# Patient Record
Sex: Male | Born: 1945 | Race: White | Hispanic: No | Marital: Married | Smoking: Former smoker
Health system: Southern US, Community
[De-identification: ages and names within clinical notes are randomized; demographics above are authoritative.]

## PROBLEM LIST (undated history)

## (undated) DIAGNOSIS — M109 Gout, unspecified: Secondary | ICD-10-CM

## (undated) DIAGNOSIS — E785 Hyperlipidemia, unspecified: Secondary | ICD-10-CM

## (undated) DIAGNOSIS — I509 Heart failure, unspecified: Secondary | ICD-10-CM

## (undated) DIAGNOSIS — C801 Malignant (primary) neoplasm, unspecified: Secondary | ICD-10-CM

## (undated) DIAGNOSIS — M1990 Unspecified osteoarthritis, unspecified site: Secondary | ICD-10-CM

## (undated) DIAGNOSIS — I639 Cerebral infarction, unspecified: Secondary | ICD-10-CM

## (undated) DIAGNOSIS — E119 Type 2 diabetes mellitus without complications: Secondary | ICD-10-CM

## (undated) DIAGNOSIS — I10 Essential (primary) hypertension: Secondary | ICD-10-CM

## (undated) DIAGNOSIS — J45909 Unspecified asthma, uncomplicated: Secondary | ICD-10-CM

## (undated) HISTORY — PX: CHOLECYSTECTOMY: SHX55

---

## 2010-04-06 ENCOUNTER — Ambulatory Visit: Payer: Self-pay | Admitting: Family Medicine

## 2010-07-10 ENCOUNTER — Inpatient Hospital Stay: Payer: Self-pay | Admitting: Internal Medicine

## 2010-08-07 ENCOUNTER — Ambulatory Visit: Payer: Self-pay

## 2010-08-25 ENCOUNTER — Emergency Department: Payer: Self-pay | Admitting: Emergency Medicine

## 2011-03-15 ENCOUNTER — Emergency Department: Payer: Self-pay | Admitting: *Deleted

## 2011-05-07 ENCOUNTER — Emergency Department: Payer: Self-pay | Admitting: Emergency Medicine

## 2011-05-11 ENCOUNTER — Emergency Department: Payer: Self-pay | Admitting: Emergency Medicine

## 2011-06-02 ENCOUNTER — Emergency Department: Payer: Self-pay | Admitting: *Deleted

## 2011-08-29 ENCOUNTER — Emergency Department: Payer: Self-pay | Admitting: Emergency Medicine

## 2011-08-29 LAB — CBC
HGB: 11.5 g/dL — ABNORMAL LOW (ref 13.0–18.0)
MCH: 30 pg (ref 26.0–34.0)
MCV: 89 fL (ref 80–100)
Platelet: 206 10*3/uL (ref 150–440)
RBC: 3.84 10*6/uL — ABNORMAL LOW (ref 4.40–5.90)

## 2011-08-29 LAB — BASIC METABOLIC PANEL
Anion Gap: 10 (ref 7–16)
BUN: 19 mg/dL — ABNORMAL HIGH (ref 7–18)
Chloride: 102 mmol/L (ref 98–107)
Co2: 30 mmol/L (ref 21–32)
Creatinine: 1.01 mg/dL (ref 0.60–1.30)
EGFR (African American): >60
Osmolality: 287 (ref 275–301)

## 2011-10-05 ENCOUNTER — Emergency Department: Payer: Self-pay | Admitting: Emergency Medicine

## 2011-10-05 LAB — BASIC METABOLIC PANEL
Anion Gap: 13 (ref 7–16)
Calcium, Total: 9.4 mg/dL (ref 8.5–10.1)
Co2: 30 mmol/L (ref 21–32)
Creatinine: 1.03 mg/dL (ref 0.60–1.30)
EGFR (African American): >60

## 2011-10-05 LAB — CBC
HCT: 37 % — ABNORMAL LOW (ref 40.0–52.0)
HGB: 12.2 g/dL — ABNORMAL LOW (ref 13.0–18.0)
MCH: 28.5 pg (ref 26.0–34.0)
MCHC: 32.9 g/dL (ref 32.0–36.0)

## 2011-10-05 LAB — URINALYSIS, COMPLETE
Ketone: NEGATIVE
Ph: 7 (ref 4.5–8.0)
Protein: NEGATIVE
RBC,UR: 1 /HPF (ref 0–5)
Specific Gravity: 1.011 (ref 1.003–1.030)
Squamous Epithelial: NONE SEEN

## 2011-10-07 LAB — URINE CULTURE

## 2012-09-21 IMAGING — CR DG LUMBAR SPINE 2-3V
1 series · 3 of 3 positions shown · non-contrast
Comparison: none

REASON FOR EXAM: back pain
COMMENTS:

PROCEDURE:     DXR - DXR LUMBAR SPINE AP AND LATERAL  - March 16, 2011  [DATE]
RESULT:     Comparison: None

[Series 1: view not recorded · 0.17mm/px · 3 of 3 slices shown]
[im 1/3]
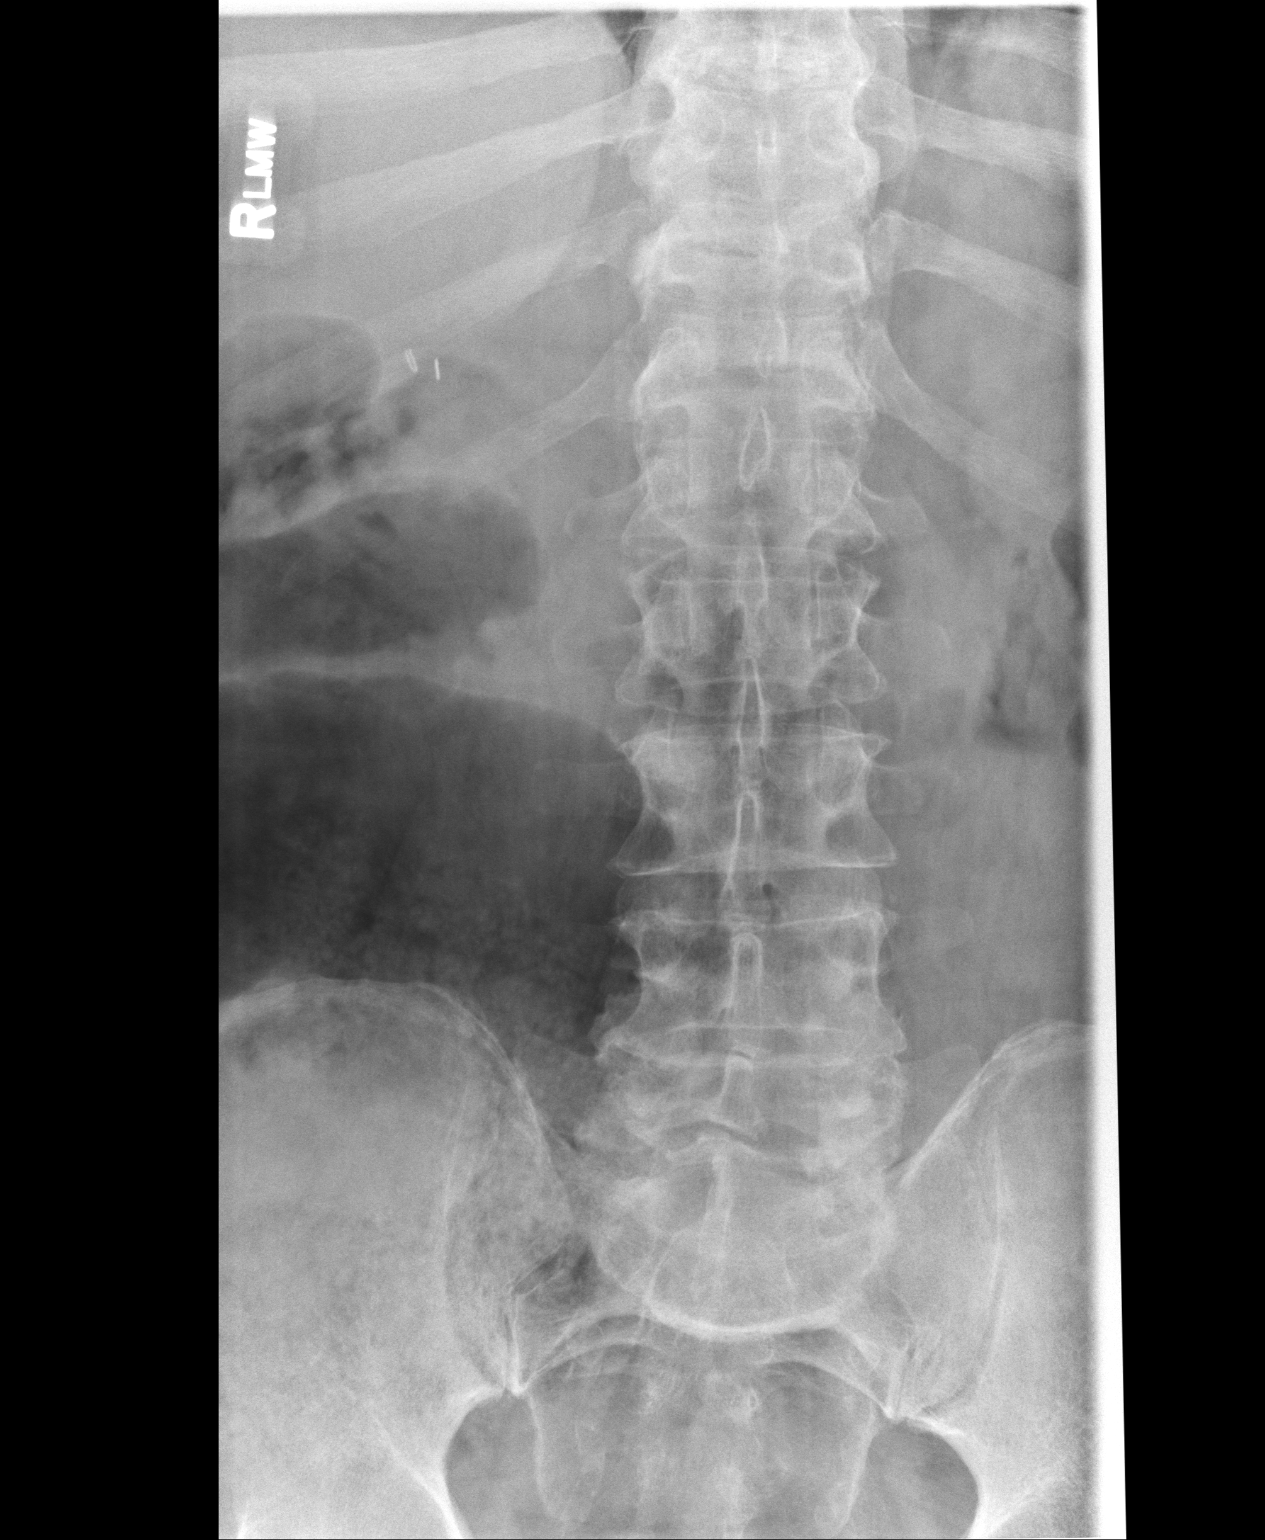
[im 2/3]
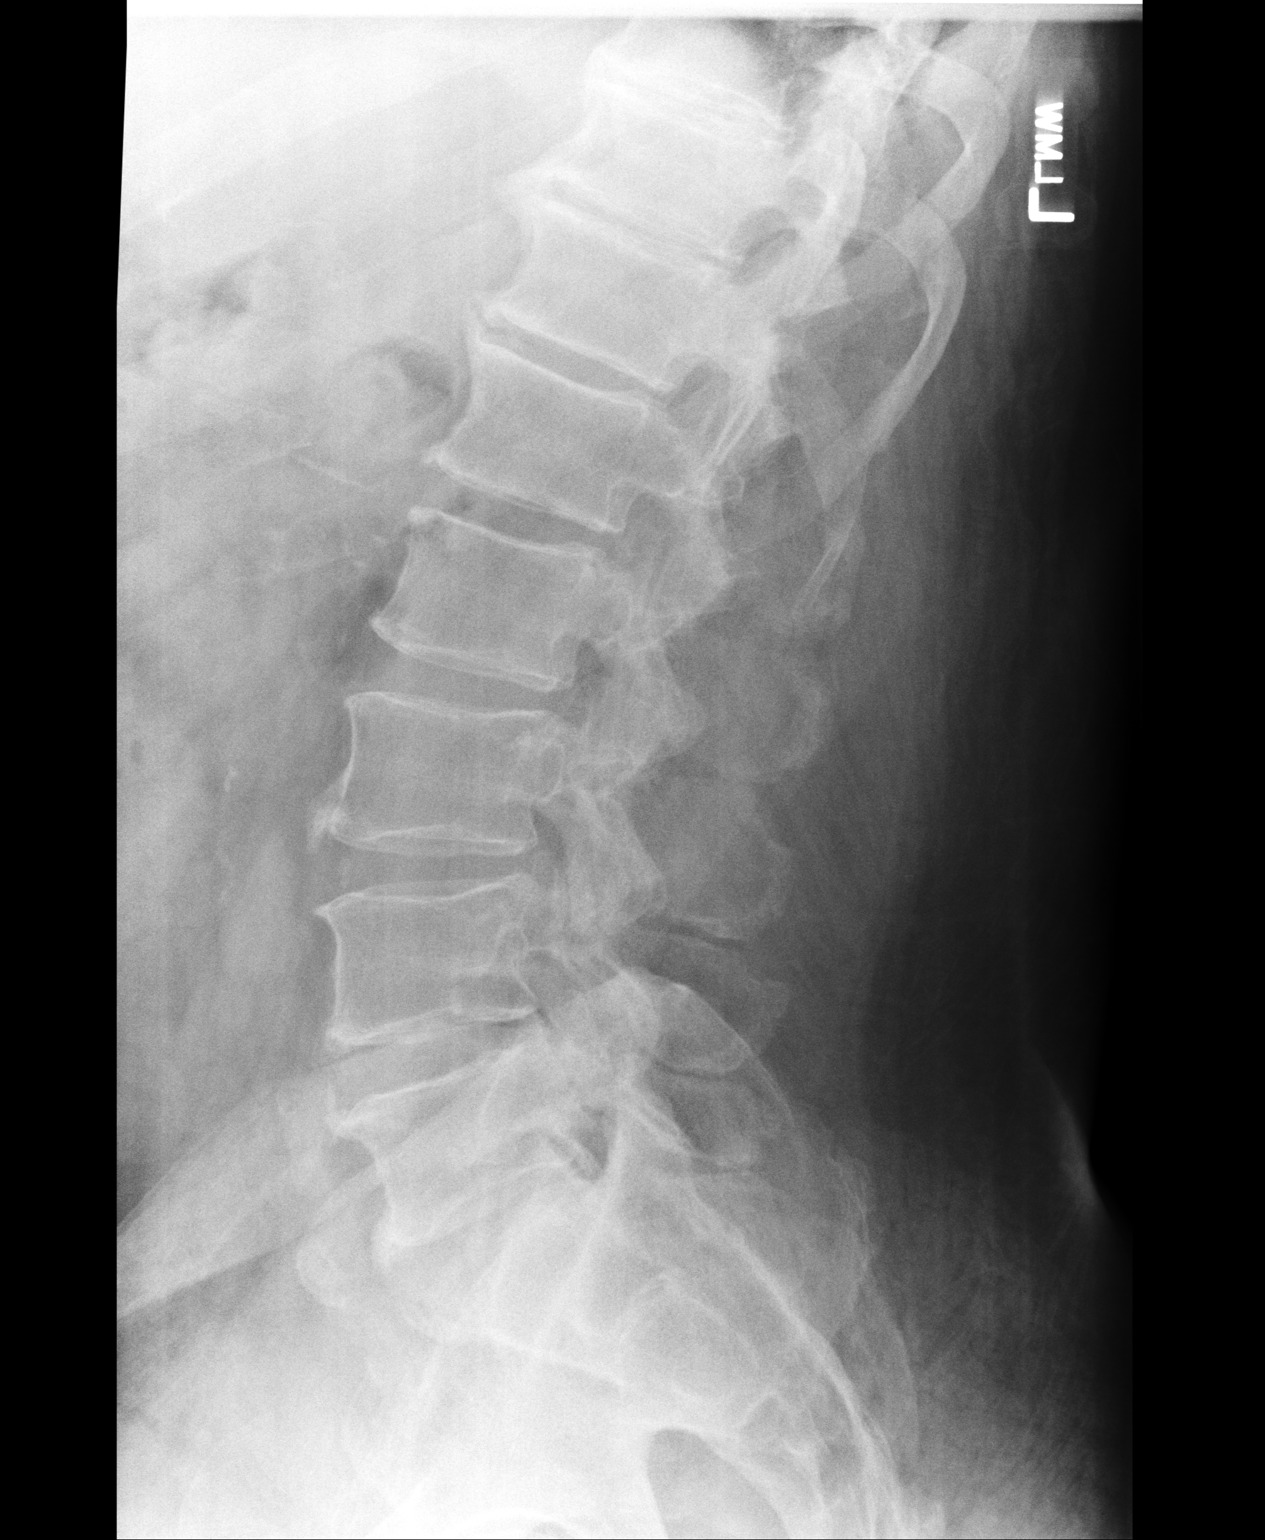
[im 3/3]
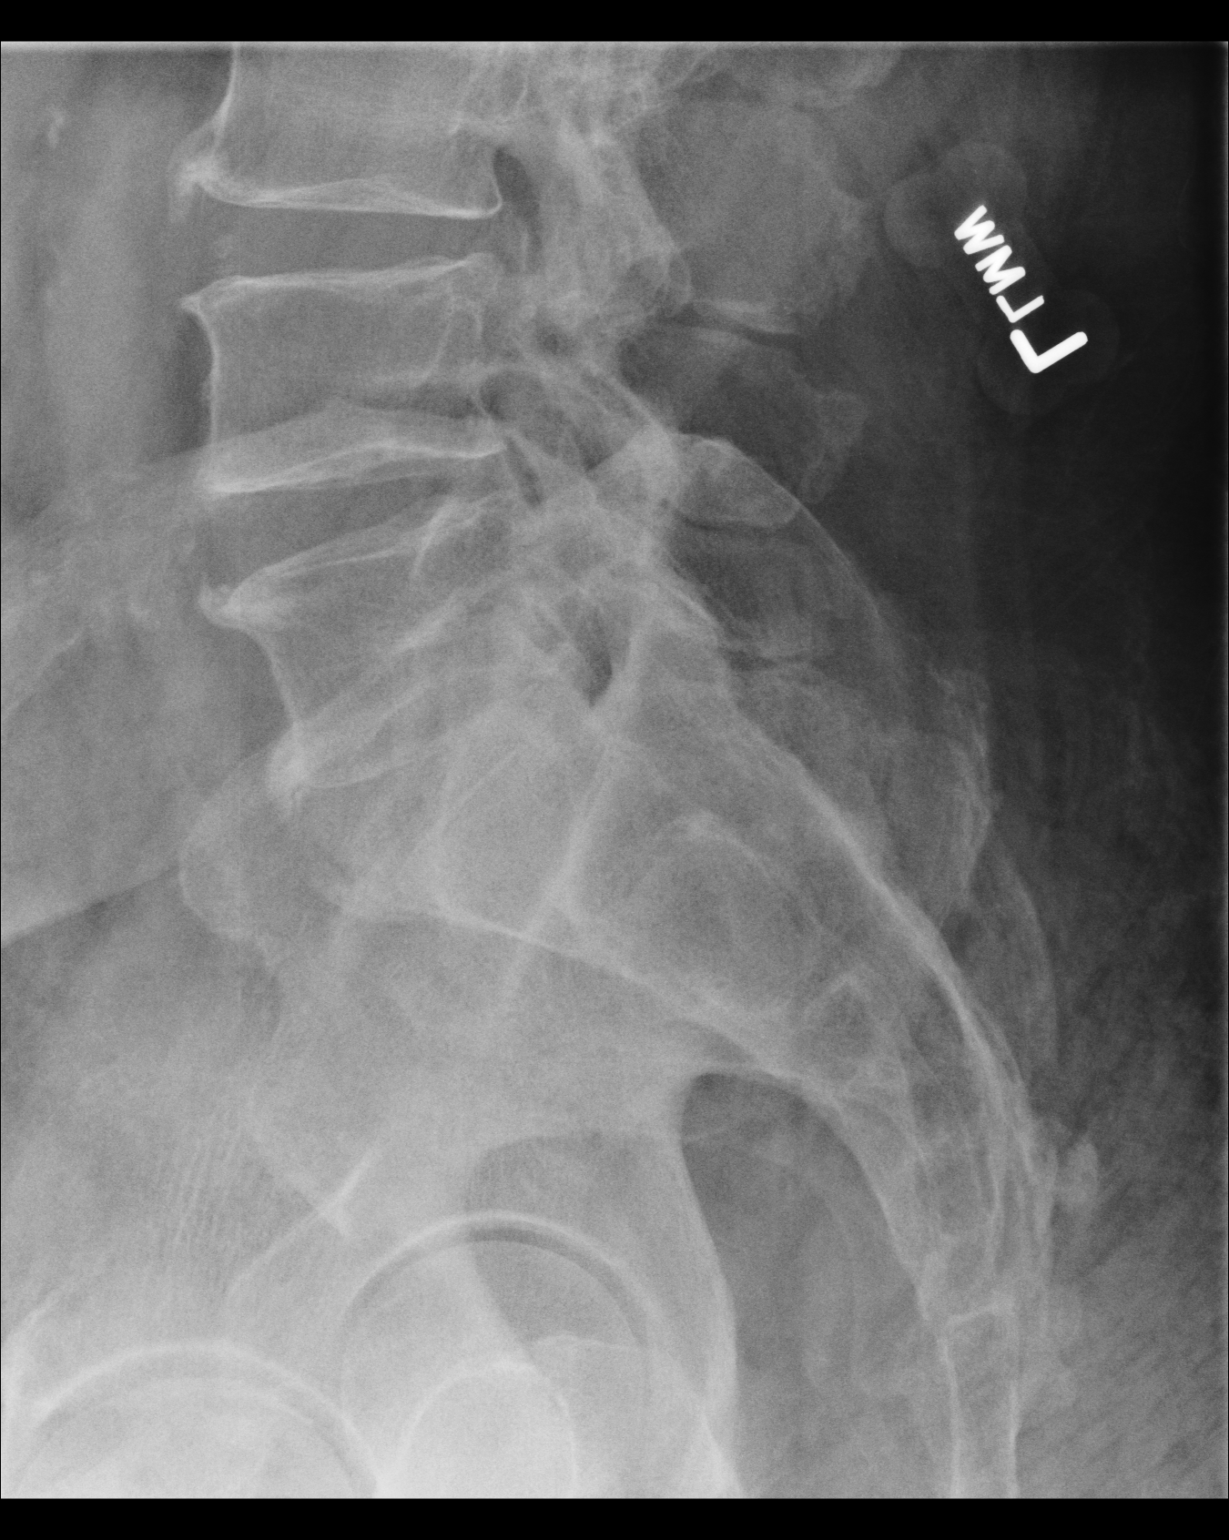

[3 of 3 positions shown; findings below may reference images not displayed]

FINDINGS: AP and lateral views of the lumbar spine and a coned down view of the
lumbosacral junction are provided.

There are 5 nonrib bearing lumbar-type vertebral bodies. The vertebral body
heights are maintained. The alignment is anatomic. There is no
spondylolysis. There is no acute fracture or static listhesis. The disc
spaces are maintained. There is lower lumbar spine facet arthropathy.

The SI joints are unremarkable.
IMPRESSION: 1. No acute osseous abnormality of the lumbar spine.

## 2013-01-19 LAB — COMPREHENSIVE METABOLIC PANEL
Albumin: 2.8 g/dL — ABNORMAL LOW (ref 3.4–5.0)
Alkaline Phosphatase: 78 U/L (ref 50–136)
BUN: 10 mg/dL (ref 7–18)
Bilirubin,Total: 0.4 mg/dL (ref 0.2–1.0)
Chloride: 104 mmol/L (ref 98–107)
Creatinine: 0.99 mg/dL (ref 0.60–1.30)
EGFR (African American): >60
EGFR (Non-African Amer.): >60
Glucose: 128 mg/dL — ABNORMAL HIGH (ref 65–99)
Osmolality: 278 (ref 275–301)
Potassium: 3.6 mmol/L (ref 3.5–5.1)
SGOT(AST): 33 U/L (ref 15–37)
Sodium: 139 mmol/L (ref 136–145)

## 2013-01-19 LAB — URINALYSIS, COMPLETE
Bilirubin,UR: NEGATIVE
Blood: NEGATIVE
Glucose,UR: NEGATIVE mg/dL (ref 0–75)
Protein: NEGATIVE
Specific Gravity: 1.023 (ref 1.003–1.030)

## 2013-01-19 LAB — CBC
HGB: 12.7 g/dL — ABNORMAL LOW (ref 13.0–18.0)
MCHC: 33.6 g/dL (ref 32.0–36.0)
Platelet: 174 10*3/uL (ref 150–440)
RBC: 4.28 10*6/uL — ABNORMAL LOW (ref 4.40–5.90)
RDW: 16.1 % — ABNORMAL HIGH (ref 11.5–14.5)
WBC: 9.9 10*3/uL (ref 3.8–10.6)

## 2013-01-19 LAB — CK TOTAL AND CKMB (NOT AT ARMC): CK-MB: 2 ng/mL (ref 0.5–3.6)

## 2013-01-20 ENCOUNTER — Inpatient Hospital Stay: Payer: Self-pay | Admitting: Internal Medicine

## 2013-01-21 LAB — CBC WITH DIFFERENTIAL/PLATELET
Basophil #: 0 10*3/uL (ref 0.0–0.1)
Basophil %: 0.4 %
Eosinophil #: 0.4 10*3/uL (ref 0.0–0.7)
HCT: 38.1 % — ABNORMAL LOW (ref 40.0–52.0)
Lymphocyte %: 47.4 %
MCV: 89 fL (ref 80–100)
Monocyte #: 1 x10 3/mm (ref 0.2–1.0)
Monocyte %: 16 %
Neutrophil #: 1.9 10*3/uL (ref 1.4–6.5)
Platelet: 166 10*3/uL (ref 150–440)

## 2013-01-21 LAB — BASIC METABOLIC PANEL
Anion Gap: 6 — ABNORMAL LOW (ref 7–16)
BUN: 12 mg/dL (ref 7–18)
Co2: 32 mmol/L (ref 21–32)
Creatinine: 1.08 mg/dL (ref 0.60–1.30)
EGFR (African American): >60
EGFR (Non-African Amer.): >60
Glucose: 138 mg/dL — ABNORMAL HIGH (ref 65–99)
Osmolality: 276 (ref 275–301)
Potassium: 3.4 mmol/L — ABNORMAL LOW (ref 3.5–5.1)
Sodium: 137 mmol/L (ref 136–145)

## 2013-01-21 LAB — URINE CULTURE

## 2013-01-22 LAB — BASIC METABOLIC PANEL
BUN: 14 mg/dL (ref 7–18)
Calcium, Total: 9.3 mg/dL (ref 8.5–10.1)
Chloride: 102 mmol/L (ref 98–107)
Co2: 32 mmol/L (ref 21–32)
Creatinine: 0.83 mg/dL (ref 0.60–1.30)
EGFR (African American): >60
EGFR (Non-African Amer.): >60
Potassium: 3.8 mmol/L (ref 3.5–5.1)

## 2013-01-24 LAB — BASIC METABOLIC PANEL
Anion Gap: 7 (ref 7–16)
BUN: 22 mg/dL — ABNORMAL HIGH (ref 7–18)
Calcium, Total: 9 mg/dL (ref 8.5–10.1)
Chloride: 99 mmol/L (ref 98–107)
EGFR (Non-African Amer.): >60
Glucose: 115 mg/dL — ABNORMAL HIGH (ref 65–99)
Potassium: 3.9 mmol/L (ref 3.5–5.1)
Sodium: 138 mmol/L (ref 136–145)

## 2013-01-25 LAB — CULTURE, BLOOD (SINGLE)

## 2013-03-05 ENCOUNTER — Observation Stay: Payer: Self-pay | Admitting: Internal Medicine

## 2013-03-05 LAB — COMPREHENSIVE METABOLIC PANEL
Anion Gap: 3 — ABNORMAL LOW (ref 7–16)
Bilirubin,Total: 0.5 mg/dL (ref 0.2–1.0)
Calcium, Total: 8.4 mg/dL — ABNORMAL LOW (ref 8.5–10.1)
Chloride: 102 mmol/L (ref 98–107)
Co2: 31 mmol/L (ref 21–32)
Creatinine: 0.99 mg/dL (ref 0.60–1.30)
EGFR (African American): >60
EGFR (Non-African Amer.): >60
Glucose: 102 mg/dL — ABNORMAL HIGH (ref 65–99)
Osmolality: 272 (ref 275–301)
Potassium: 4.7 mmol/L (ref 3.5–5.1)
SGOT(AST): 43 U/L — ABNORMAL HIGH (ref 15–37)
Sodium: 136 mmol/L (ref 136–145)
Total Protein: 6.9 g/dL (ref 6.4–8.2)

## 2013-03-05 LAB — CBC
HCT: 38.2 % — ABNORMAL LOW (ref 40.0–52.0)
MCHC: 34.1 g/dL (ref 32.0–36.0)
Platelet: 162 10*3/uL (ref 150–440)

## 2013-03-05 LAB — PRO B NATRIURETIC PEPTIDE: B-Type Natriuretic Peptide: 32 pg/mL (ref 0–125)

## 2013-03-05 LAB — HEMOGLOBIN A1C: Hemoglobin A1C: 7.8 % — ABNORMAL HIGH (ref 4.2–6.3)

## 2013-03-06 LAB — BASIC METABOLIC PANEL
Anion Gap: 2 — ABNORMAL LOW (ref 7–16)
BUN: 12 mg/dL (ref 7–18)
Calcium, Total: 8.6 mg/dL (ref 8.5–10.1)
Chloride: 103 mmol/L (ref 98–107)
EGFR (Non-African Amer.): >60
Glucose: 123 mg/dL — ABNORMAL HIGH (ref 65–99)
Sodium: 138 mmol/L (ref 136–145)

## 2013-03-06 LAB — URINALYSIS, COMPLETE
Glucose,UR: NEGATIVE mg/dL (ref 0–75)
Ketone: NEGATIVE
RBC,UR: NONE SEEN /HPF (ref 0–5)
Specific Gravity: 1.015 (ref 1.003–1.030)
Squamous Epithelial: <1

## 2013-03-06 LAB — CBC WITH DIFFERENTIAL/PLATELET
HGB: 12.6 g/dL — ABNORMAL LOW (ref 13.0–18.0)
Lymphocyte #: 2.8 10*3/uL (ref 1.0–3.6)
Lymphocyte %: 40.5 %
MCHC: 33.6 g/dL (ref 32.0–36.0)
Monocyte #: 0.9 x10 3/mm (ref 0.2–1.0)
Neutrophil #: 2.8 10*3/uL (ref 1.4–6.5)
Neutrophil %: 40.1 %
Platelet: 157 10*3/uL (ref 150–440)
WBC: 6.9 10*3/uL (ref 3.8–10.6)

## 2013-03-19 ENCOUNTER — Emergency Department: Payer: Self-pay | Admitting: Emergency Medicine

## 2013-03-19 LAB — COMPREHENSIVE METABOLIC PANEL
BUN: 15 mg/dL (ref 7–18)
EGFR (African American): >60
EGFR (Non-African Amer.): >60
Osmolality: 282 (ref 275–301)
Potassium: 4.6 mmol/L (ref 3.5–5.1)
SGOT(AST): 46 U/L — ABNORMAL HIGH (ref 15–37)
SGPT (ALT): 52 U/L (ref 12–78)
Sodium: 138 mmol/L (ref 136–145)
Total Protein: 7.2 g/dL (ref 6.4–8.2)

## 2013-03-19 LAB — CBC
HCT: 40.8 % (ref 40.0–52.0)
HGB: 14.3 g/dL (ref 13.0–18.0)
MCH: 30.8 pg (ref 26.0–34.0)
Platelet: 181 10*3/uL (ref 150–440)
RBC: 4.64 10*6/uL (ref 4.40–5.90)
RDW: 15.7 % — ABNORMAL HIGH (ref 11.5–14.5)

## 2013-03-19 LAB — TROPONIN I: Troponin-I: <0.02 ng/mL

## 2013-09-09 ENCOUNTER — Emergency Department: Payer: Self-pay | Admitting: Emergency Medicine

## 2013-09-09 LAB — COMPREHENSIVE METABOLIC PANEL
ALT: 36 U/L (ref 12–78)
Albumin: 3 g/dL — ABNORMAL LOW (ref 3.4–5.0)
Alkaline Phosphatase: 84 U/L
Anion Gap: 3 — ABNORMAL LOW (ref 7–16)
BUN: 13 mg/dL (ref 7–18)
Bilirubin,Total: 0.7 mg/dL (ref 0.2–1.0)
CALCIUM: 8.5 mg/dL (ref 8.5–10.1)
CREATININE: 0.95 mg/dL (ref 0.60–1.30)
Chloride: 101 mmol/L (ref 98–107)
Co2: 32 mmol/L (ref 21–32)
Glucose: 115 mg/dL — ABNORMAL HIGH (ref 65–99)
Osmolality: 273 (ref 275–301)
POTASSIUM: 4.8 mmol/L (ref 3.5–5.1)
SGOT(AST): 44 U/L — ABNORMAL HIGH (ref 15–37)
SODIUM: 136 mmol/L (ref 136–145)
TOTAL PROTEIN: 7.1 g/dL (ref 6.4–8.2)

## 2013-09-09 LAB — CBC WITH DIFFERENTIAL/PLATELET
BASOS ABS: 0.1 10*3/uL (ref 0.0–0.1)
Basophil %: 0.4 %
EOS ABS: 0.4 10*3/uL (ref 0.0–0.7)
Eosinophil %: 3.6 %
HCT: 38.8 % — AB (ref 40.0–52.0)
HGB: 12.4 g/dL — ABNORMAL LOW (ref 13.0–18.0)
LYMPHS ABS: 2.8 10*3/uL (ref 1.0–3.6)
Lymphocyte %: 23.4 %
MCH: 28.5 pg (ref 26.0–34.0)
MCHC: 32 g/dL (ref 32.0–36.0)
MCV: 89 fL (ref 80–100)
Monocyte #: 1.8 x10 3/mm — ABNORMAL HIGH (ref 0.2–1.0)
Monocyte %: 14.7 %
Neutrophil #: 6.9 10*3/uL — ABNORMAL HIGH (ref 1.4–6.5)
Neutrophil %: 57.9 %
Platelet: 170 10*3/uL (ref 150–440)
RBC: 4.36 10*6/uL — AB (ref 4.40–5.90)
RDW: 16.2 % — ABNORMAL HIGH (ref 11.5–14.5)
WBC: 11.9 10*3/uL — AB (ref 3.8–10.6)

## 2013-09-09 LAB — LIPASE, BLOOD: LIPASE: 54 U/L — AB (ref 73–393)

## 2014-01-09 ENCOUNTER — Emergency Department: Payer: Self-pay | Admitting: Emergency Medicine

## 2014-07-29 IMAGING — CR DG CHEST 1V PORT
1 series · 1 of 1 positions shown · non-contrast
Comparison: none

REASON FOR EXAM: cough hypoxia
COMMENTS:

[ap]
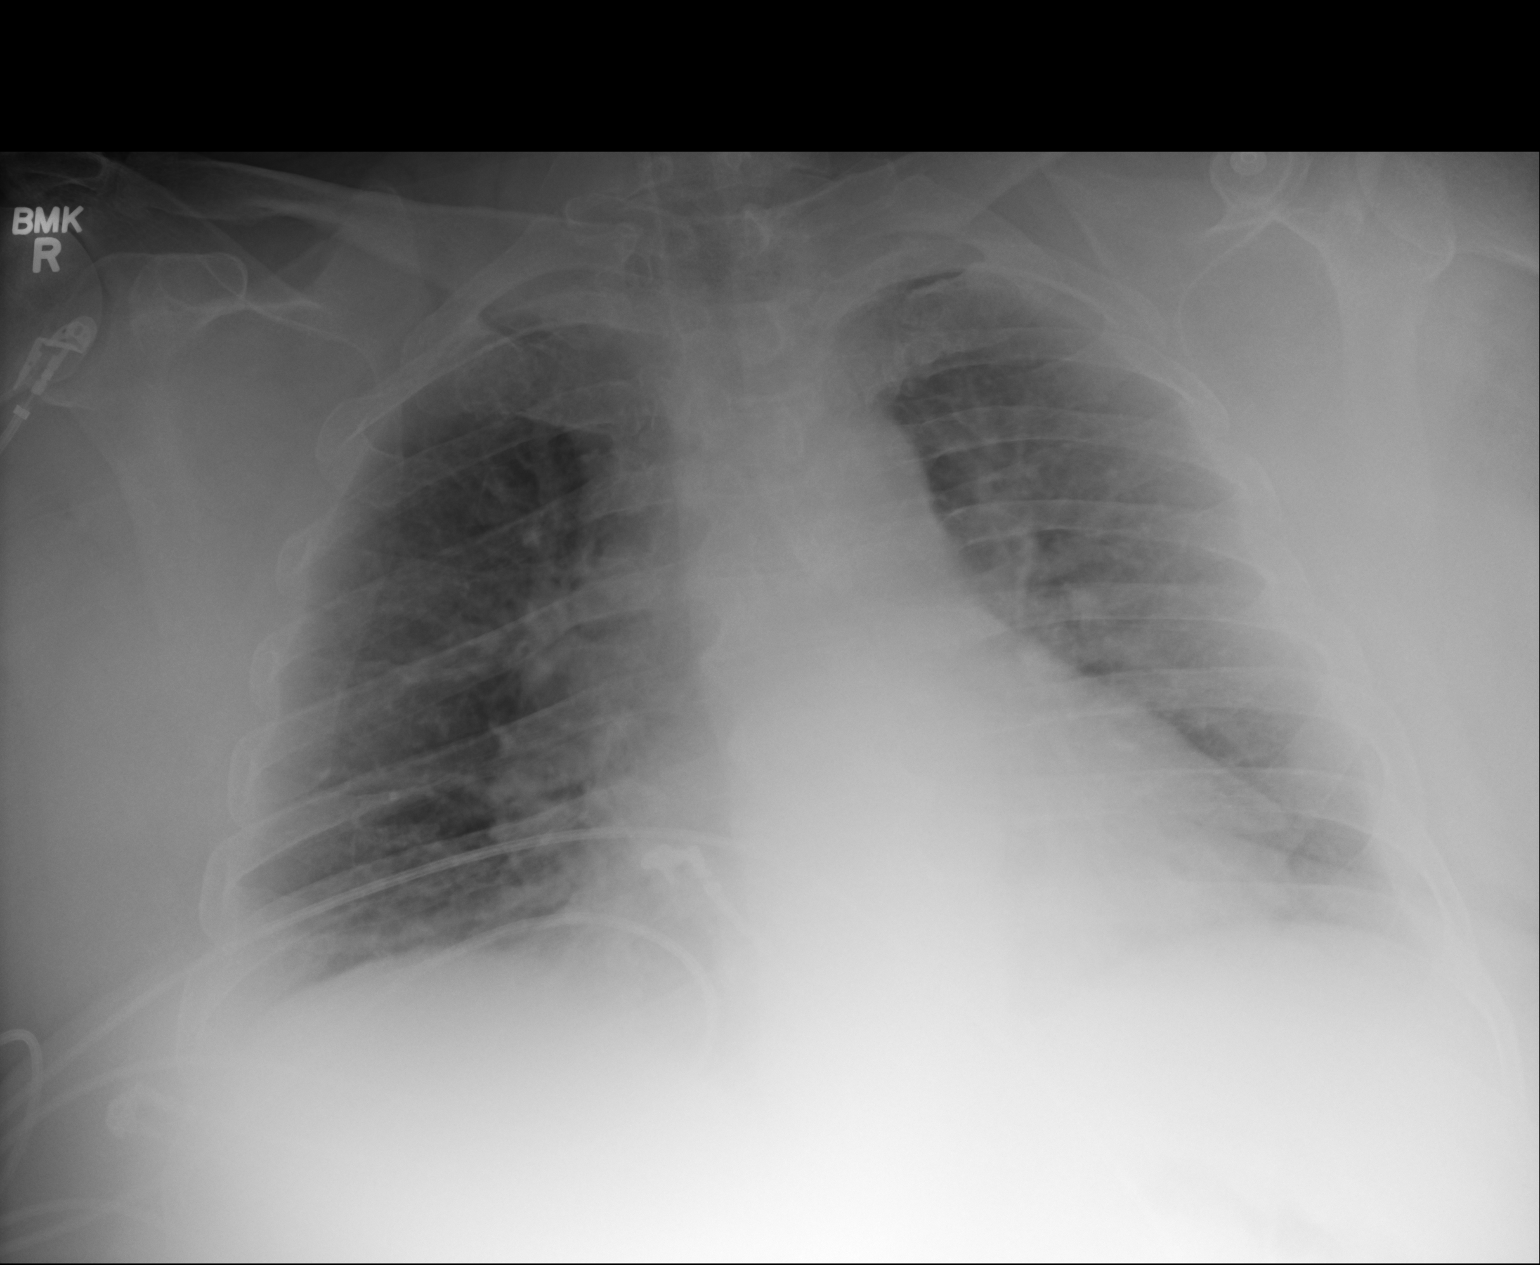

[1 of 1 positions shown; findings below may reference images not displayed]

PROCEDURE:     DXR - DXR PORTABLE CHEST SINGLE VIEW  - January 20, 2013 [DATE]

RESULT:     Comparison is made to the study May 11, 2011.

The lungs are adequately inflated. The cardiac silhouette is enlarged. The
interstitial markings are increased. There is some blunting of the
costophrenic angles. The observed portions of the bony thorax are normal.
IMPRESSION: The findings are consistent with CHF with mild interstitial
edema. A followup PA and lateral chest x-ray would be of value.

[REDACTED]

## 2014-11-01 ENCOUNTER — Emergency Department: Payer: Self-pay | Admitting: Emergency Medicine

## 2014-12-08 NOTE — H&P (Signed)
PATIENT NAME:  Mario Blackburn, HALLADAY MR#:  [id number] DATE OF BIRTH:  14-Jan-1946  DATE OF ADMISSION:  01/20/2013  REFERRING PHYSICIAN:  Dr. Marjean Donna.   PRIMARY CARE PHYSICIAN:  Dr. Dorthea Cove.   CHIEF COMPLAINT:  Urinary problems, cough.   HISTORY OF PRESENT ILLNESS:  This is a 69 year old male significant past medical history of diabetes, hypertension, hypercholesterolemia, coronary artery disease, bipolar disorder, obstructive sleep apnea, presents with multiple complaints, mainly his problem was he reporting some urinary incontinence, as he reports he cannot stand up on time going to the bathroom whenever he wants to urinate so he did wet himself a few times which prompted him to come to the ED, the patient is morbidly obese, ambulates with a walker, the patient denies any dysuria, any polyuria or any other focal neurological deficit, in the ED, the patient has Foley catheter inserted where he had a total of 150 to 200 mL drained, where his urinalysis was negative, but the patient was noticed to have fever of 101.8, as well hypoxic, 90% on room air, as well he has been reporting been having cough with productive sputum for the last three days, the patient had portable chest x-ray done which was poor quality where we could not evaluate for any infiltrate in that chest x-ray, the patient will have blood culture sent, was started on as needed oxygen and was started on IV Levaquin for community-acquired pneumonia, the patient denies any chest pain, any constipation, any bright red blood per rectum, any coffee-ground emesis, any headache, lightheadedness, hospitalist service were requested to admit the patient for further management and treatment of his pneumonia.   ALLERGIES:   1.  BENADRYL.  2.  PENICILLIN.  3.  KEFLEX.  4.  MORPHINE.  5.  PERCOCET.   PAST MEDICAL HISTORY: 1.  Diabetes.  2.  Obstructive sleep apnea.  3.  Hypertension.  4.  Hypercholesterolemia.  5.  Coronary artery  disease.  6.  Bipolar disorder.  7.  History of hypothyroidism.  SOCIAL HISTORY:  The patient has history of previous smoking in the past, but quit many years ago, no history of alcohol abuse or illicit drug use.  He is on disability.   FAMILY HISTORY:  Significant for cancer.  He cannot recall which type.   PAST SURGICAL HISTORY:  History of cholecystectomy.   HOME MEDICATIONS: 1.  Aspirin 81 mg oral daily.  2.  Lisinopril 40 mg oral daily.  3.  Gabapentin 600 mg oral 2 times a day.  4.  Effexor-XR 150 oral 2 times a day.  5.  Lantus 40 units subQ at bedtime.  6.  NovoLog 20 units subQ before meals.  7.  Allopurinol 100 mg oral daily.  8.  Colchicine 0.6 mg oral daily.  9.  Norvasc 10 mg.  10.  Atorvastatin 40 mg oral daily.  11.  Abilify 10 mg oral daily.  12.  Lasix 20 mg oral daily.  13.  Toprol-XL 25 mg oral daily.  14.  Potassium 8 mEq oral daily.  15.  Omeprazole 20 mg oral daily.  16.  Ergocalciferol 50,000 international units IM monthly.  17.  Levothyroxine 50 mcg oral daily.   REVIEW OF SYSTEMS:  CONSTITUIONAL:  Complains of fever, fatigue, weakness.  Denies any weight gain, weight loss.  EYES:  Denies blurry vision, double vision, inflammation, glaucoma.  EARS, NOSE, THROAT:  Denies tinnitus, ear pain, hearing loss, epistaxis.  RESPIRATORY:  Complains of cough, productive sputum, dyspnea.  CARDIOVASCULAR:  Denies  chest pain, edema, arrhythmia, palpitations, syncope.  GASTROINTESTINAL:  Denies nausea, vomiting, diarrhea, abdominal pain, hematemesis, jaundice, rectal bleed, constipation.  GENITOURINARY:  Denies dysuria, hematuria, renal colic.  Complains of increased frequency and incontinence.  ENDOCRINE:  Denies polyuria, polydipsia, heat or cold intolerance.  HEMATOLOGY:  Denies anemia, easy bruising, bleeding diathesis.  MUSCULOSKELETAL:  Has history of gout, has limited activity, ambulates with a walker.  NEUROLOGIC:  Denies any migraine, ataxia, dementia,  headache, vertigo, tremors.  PSYCHIATRIC:  History of bipolar disorder.  Denies insomnia, anxiety, substance or alcohol abuse.   PHYSICAL EXAMINATION: VITAL SIGNS:  Temperature 101.9, pulse 94, respiratory rate 24, blood pressure 171/69, saturating 91% on room air.  GENERAL:  Morbidly obese male looks comfortable in bed in no apparent distress.  HEENT:  Head atraumatic, normocephalic.  Pupils equal, reactive to light.  Pink conjunctivae.  Anicteric sclerae.  Moist oral mucosa.  NECK:  Supple.  No thyromegaly.  No JVD.  CHEST:  Good air entry bilaterally.  No wheezing, rales or rhonchi.  CARDIOVASCULAR:  S1, S2 heard.  No rubs, murmurs or gallops.  ABDOMEN:  Morbidly obese, soft, nontender, nondistended.  Bowel sounds present.  Has surgical scar from previous cholecystectomy.  EXTREMITIES:  Has a chronic lower extremity discoloration changes, but no edema.  No clubbing.  No cyanosis.  Dorsalis pedis felt bilaterally.  PSYCHIATRIC:  Appropriate affect.  Awake, alert x 3.  Intact judgment and insight.  NEUROLOGIC:  Cranial nerves grossly intact.  No focal motor or sensory deficits.   PERTINENT LABORATORY DATA:  Glucose 128, BUN 10, creatinine 0.99, sodium 139, potassium 3.6, chloride 104, CO2 30.  ALT 35, AST 33, alkaline phosphatase 78.  Troponin less than 0.02.  White blood cells 9.9, hemoglobin 12.7, hematocrit 37.9, platelets 174.  Urinalysis negative for nitrite and leukocyte esterase and 1 white blood cell.  Chest x-ray, could not evaluate for any infiltrate or opacity secondary to poor quality.   ASSESSMENT AND PLAN: 1.  Pneumonia, the patient is febrile, with cough productive of sputum, chest x-ray portable is poor quality.  We will obtain two-view chest x-ray, the patient will be started on levofloxacin for community-acquired pneumonia.  We will follow on the blood cultures.  2.  Urinary incontinence, this is secondary to poor functional status secondary to patient's morbid obesity.  His  urinalysis is negative, we will discontinue Foley catheter.  3.  Obstructive sleep apnea, on BiPAP.  4.  Diabetes mellitus.  We will continue on Lantus and insulin sliding scale.  5.  Hypertension, uncontrolled.  Continue home medication.  Add as needed hydralazine as needed.  6.  Hypercholesterolemia.  Continue with statin.  7.  Coronary artery disease.  No complaints of chest pain, we will continue on beta-blockers, lisinopril, aspirin and statin.  8.  Bipolar disorder.  Continue home medication.  9.  Hypothyroidism.  Continue on Synthroid. 10.  Deep vein thrombosis prophylaxis.  SubQ heparin.  11.  Gastrointestinal prophylaxis.  On proton pump inhibitor.  12.  CODE STATUS:  FULL CODE.   Total time spent on admission and patient care 55 minutes.     ____________________________ Albertine Patricia, MD dse:ea D: 01/20/2013 03:27:49 ET T: 01/20/2013 03:59:27 ET JOB#: [id number]  cc: Albertine Patricia, MD, <Dictator> Gasper Hopes Graciela Husbands MD ELECTRONICALLY SIGNED 01/21/2013 4:16

## 2014-12-08 NOTE — H&P (Signed)
PATIENT NAME:  Mario Blackburn, Mario Blackburn MR#:  [id number] DATE OF BIRTH:  Apr 24, 1946  DATE OF ADMISSION:  03/05/2013  ADMITTING PHYSICIAN: Gladstone Lighter, MD  PRIMARY CARE PHYSICIAN: Dr. Derrek Monaco at the [facility] program.   CHIEF COMPLAINT: Dizziness and generalized weakness.   HISTORY OF PRESENT ILLNESS: Mr. Spisak is a 69 year old, morbidly obese Caucasian male with past medical history significant for obstructive sleep apnea, diabetes, hypertension, bipolar disorder, comes from home secondary to above-mentioned complaints. The patient was in the hospital last month in June 2014 for pneumonia and was discharged to [facility]. The patient was at [facility] for a couple of weeks and was discharged home. He said he was doing fine using his 4-wheeled rolling walker, but has had an episode of low-grade fever and a few loose stools 2 days ago and since then has become very dizzy, weak, unable to care for himself and so was brought to the hospital. His blood work did not reveal any significant abnormalities. His BNP was within normal limits as well, but the patient's wife could not care for him at home, so he is being admitted to the hospital under observation.   PAST MEDICAL HISTORY: 1.  Insulin-dependent diabetes mellitus.  2.  Obstructive sleep apnea on CPAP.  3.  Hypertension.  4.  Hyperlipidemia.  5.  Coronary artery disease.  6.  Bipolar disorder.  7.  Hypothyroidism.   PAST SURGICAL HISTORY:  1.  Cholecystectomy.  2.  Cataract surgery.   ALLERGIES TO MEDICATIONS:  1.  BENADRYL.  2.  KEFLEX.  3.  MORPHINE.  4.  PENICILLIN.  5.  PERCOCET.   CURRENT HOME MEDICATIONS:  1.  Abilify 10 mg p.o. daily.  2.  Tylenol 650 mg q.4 hours p.r.n.  3.  Advair 250/50, 1 puff b.i.d.  4.  Allopurinol 100 mg p.o. daily.  5.  Norvasc 10 mg p.o. daily.  6.  Aspirin 81 mg p.o. daily.  7.  Atorvastatin 40 mg p.o. daily.  8.  Colchicine 0.6 mg p.o. daily.  9.  Depakote 250 mg p.o. b.i.d.  10.   Effexor-XR 150 mg p.o. b.i.d.  11.  Ergocalciferol 50,000 international units once a month on the first Monday of each month.  12.  Lasix 20 mg p.o. daily.  13.  Gabapentin 600 mg p.o. b.i.d.  14.  Aspart insulin 20 units subcutaneous 3 times a day before meals.  15.  Glargine insulin 35 units subcutaneous at bedtime.  16.  Synthroid 50 mcg p.o. daily.  17.  Lisinopril 40 mg p.o. daily.  18.  Lumigan eye drops 0.01% ophthalmic solution 1 drop to each eye once a day in the evening.  19.  Prilosec 20 mg p.o. at bedtime.  20.  MiraLAX powder p.r.n. for constipation daily.  21.  Potassium chloride 8 mEq p.o. daily.  22.  Senna 1 tablet p.o. b.i.d.  23.  Toprol-XL 25 mg p.o. daily.   SOCIAL HISTORY: Currently living at home with his wife. Quit smoking several years ago. No current smoking. No use of alcohol or any drugs.   FAMILY HISTORY: Mom died from metastatic cancer in her 55s and dad died from heart disease in his 84s.  REVIEW OF SYSTEMS:   CONSTITUTIONAL: No fever. Positive for fatigue and weakness.  EYES: Denies any vision, double vision. History of glaucoma present and cataract surgery done.  EARS, NOSE, THROAT: No tinnitus, ear pain, hearing loss or epistaxis.  RESPIRATORY: No cough, wheeze or hemoptysis. Positive for history  of obstructive sleep apnea and minimal dyspnea on exertion.  CARDIOVASCULAR: No chest pain, orthopnea, edema, arrhythmia, palpitations or syncope.  GASTROINTESTINAL: A few episodes of diarrhea 2 days ago, but currently none. No nausea, vomiting, and no abdominal pain. No hematemesis or melena. No constipation.  GENITOURINARY: No dysuria, hematuria, renal calculus, frequency or incontinence.  ENDOCRINE: No polyuria, nocturia, thyroid problems, heat or cold intolerance.  HEMATOLOGIC: No anemia, easy bruising or bleeding.  SKIN: No acne, rash or lesions.  MUSCULOSKELETAL: Has arthritis and gout and limited activity. Ambulates with a walker.  NEUROLOGIC: No CVA,  TIA or seizures.  PSYCHOLOGICAL: History of bipolar disorder. No anxiety or depression at this time.   PHYSICAL EXAMINATION: VITAL SIGNS: Temperature is 98.5 degrees Fahrenheit, pulse 83, respirations 20, blood pressure 148/67, pulse oximetry 94% on room air.  GENERAL: Very heavily built, morbidly obese male sitting in bed. Appears weak and not in any apparent distress.  HEENT: Normocephalic, atraumatic. Pupils postsurgical, equal, round, reacting to light. Anicteric sclerae. Extraocular movements intact. Oropharynx with dry mucous membranes. No erythema, mass or exudates.  NECK: Supple. No thyromegaly, JVD or carotid bruits. No lymphadenopathy.  LUNGS: Moving air bilaterally. No wheeze or crackles. No use of accessory muscles for breathing.  CARDIOVASCULAR: S1, S2, regular rate and rhythm. Has a III/VI  systolic murmur. No rubs or gallops.  ABDOMEN: Morbidly obese, soft, nontender, nondistended. Normal bowel sounds.  EXTREMITIES: He has chronic bilateral lower extremity edema and skin changes reflecting the same, but according to patient, edema is improved at this time. He has 3+ edema. No clubbing or cyanosis. Dorsalis pedis palpable, 2+ bilaterally.  SKIN: Other than chronic stasis changes in both lower extremities, no rash, acne or lesions.  NEUROLOGIC: Cranial nerves intact. His strength in the lower extremities is 3/5 and his upper extremities are 5/5, which is chronic according to the patient. Sensation is intact.  PSYCHOLOGICAL: The patient is awake, alert, oriented x 3.   LABORATORY DATA: WBC 10.4, hemoglobin 13.0 hematocrit 38.2, platelet count 162.   Sodium 136, potassium 4.7, chloride 102, bicarbonate 31, BUN 12, creatinine 0.9 and glucose 102 and calcium of 8.4.   ALT 35, AST 43, alkaline phosphatase 76, total bilirubin 0.5, albumin of 3.9. Troponin less than 0.02.   Chest x-ray showing diffuse interstitial thickening, could be edema versus interstitial pneumonitis.   Hemoglobin  A1c is elevated at 7.8. BNP is within normal limits at 32.   CT of the head without contrast showing no acute intracranial process. Generalized cerebral atrophy present.   EKG: Normal sinus rhythm. No acute ST-T wave abnormalities.  ASSESSMENT AND PLAN: A 69 year old male with past medical history of hypertension, obstructive sleep apnea, diabetes, admitted for dizziness and weakness. 1.  Generalized weakness and dizziness, started a day after diarrhea: Possible dehydration. Gentle intravenous fluids, as has history of possible diastolic dysfunction. Physical therapy has been consulted and possible rehab is appropriate. The patient would prefer to go back to [facility], as he was there last month. However, on physical therapy ambulation, if we find any ataxia or worsening of his dizziness, cerebellar infarct will need to be in the differential, but there are no focal neuro deficit on exam otherwise. 2.  Hypertension: Continue his home medications. 3.  Diabetes mellitus: HbA1c is elevated at 7.8. He is insulin-dependent. Continue his Lantus, premeal regular insulin. 4.  Bipolar disorder: Continue home medications. 5.  Obstructive sleep apnea: CPAP at bedtime and oxygen as needed. 6.  Care management consult for  discharge planning. 7.  Gastrointestinal and deep vein thrombosis prophylaxis: On Prilosec and TEDs and sequential compression devices.   CODE STATUS: Full code.   TIME SPENT ON ADMISSION: 50 minutes.    ____________________________ Gladstone Lighter, MD rk:jm D: 03/05/2013 12:52:28 ET T: 03/05/2013 13:35:55 ET JOB#: [id number]  cc: Gladstone Lighter, MD, <Dictator> Danelle Berry. Derrek Monaco, MD Gladstone Lighter MD ELECTRONICALLY SIGNED 03/21/2013 15:14

## 2014-12-08 NOTE — Discharge Summary (Signed)
PATIENT NAME:  Mario Blackburn, Mario Blackburn MR#:  [id number] DATE OF BIRTH:  October 06, 1945  DATE OF ADMISSION:  03/05/2013 DATE OF DISCHARGE:  03/07/2013  ADMITTING PHYSICIAN: Gladstone Lighter, MD   DISCHARGING PHYSICIAN: Gladstone Lighter, MD  PRIMARY CARE PHYSICIAN: Dr. Derrek Monaco from the [facility] program.   DISCHARGE DIAGNOSES: 1.  Dizziness and weakness on admission, likely secondary to dehydration from diarrhea.  2.  Hypertension.  3.  Diabetes mellitus.  4.  Bipolar disorder.  5.  Gout.  6.  Obstructive sleep apnea.  7.  Morbid obesity.  8.  Coronary artery disease.  9.  Hypothyroidism.  10.  Chronic lower extremity edema.   DISCHARGE HOME MEDICATIONS: 1.  Levothyroxine 50 mcg p.o. daily.  2.  Lasix 20 mg p.o. daily.  3.  Effexor 150 mg p.o. b.i.d.  4.  Lisinopril 40 mg p.o. daily.  5.  Aspirin 81 mg p.o. daily.  6.  Allopurinol 100 mg p.o. daily.  7.  Colcrys 0.6 mg p.o. daily.  8.  Gabapentin 600 mg p.o. b.i.d.  9.  Ergocalciferol 50,000 international units once a month on the first Monday of each month.  10.  Toprol-XL 25 mg p.o. daily.  11.  Abilify 10 mg p.o. daily.  12.  Omeprazole 20 mg p.o. daily.  13.  Tylenol 650 mg q.4 hours p.r.n. for pain or fever.  14.  Atorvastatin 40 mg p.o. daily.  15.  Senna/Colace 1 tablet p.o. b.i.d. p.r.n. for constipation.  16.  MiraLAX powder 17 grams once a day p.r.n. for constipation.  17.  Norvasc 10 mg p.o. daily.  18.  Lantus 35 units subcutaneous at bedtime.  19.  Aspart insulin 20 units subcutaneous 3 times a day before meals.  20.  Potassium chloride 8 mEq p.o. daily.  21.  Depakote 150 mg b.i.d., oral delayed capsule.  22.  Advair 250/50, 1 puff b.i.d.  23.  Lumigan eye drops 1 drop to each eye once a day in the evening.   DISCHARGE DIET: Low-sodium, ADA 1800 diet.   DISCHARGE ACTIVITY: As tolerated.    FOLLOWUP INSTRUCTIONS: 1.  Home health physical therapy and nursing.  2.  PCP followup in 1 to 2 weeks.   LABORATORY AND  IMAGING STUDIES: Prior to discharge: WBC 6.9, hemoglobin 12.6, hematocrit 37.5, platelet count 157.   Sodium 138, potassium 3.8, chloride 103, bicarbonate 33, BUN 12, creatinine 0.9 and glucose 123 and calcium of 8.6. Urinalysis negative for any infection.   CT of the head without contrast showing no acute intracranial process. Chest x-ray on admission showing diffuse bilateral interstitial thickening representing interstitial edema or pneumonitis.   HbA1c is 7.8. ABG showing pH of 7.39, pCO2 of 53, pO2 of 82, bicarbonate of 32 and sats of  96% on 2 liters nasal cannula.  BRIEF HOSPITAL COURSE: Mr. Mario Blackburn is a 69 year old obese Caucasian male with past medical history significant for obstructive sleep apnea, hypertension, diabetes, bipolar disorder, presented from home secondary to dizziness, dehydration and weakness.  1.  Dizziness and generalized weakness: The patient had a couple of days of diarrhea resulting in dehydration causing extreme dizziness and weakness, so he was admitted to the hospital. Had difficulty getting out of bed by himself, requiring 2-person assist, and worked with physical therapy who initially recommended rehab. The patient had gentle IV hydration over the weekend and re-eval by physical therapy today recommended that he could go home with home health, and the patient felt stronger and denied any complaints of dizziness at this time,  so he is being discharged home with home health.  2.  All his other medications for hypertension, diabetes and bipolar disorder were continued. His course has been otherwise uneventful in the hospital.   DISCHARGE CONDITION: Stable.   DISCHARGE DISPOSITION: Home with home health.   TIME SPENT ON DISCHARGE: 40 minutes.   ____________________________ Gladstone Lighter, MD rk:jm D: 03/07/2013 14:49:49 ET T: 03/07/2013 20:49:09 ET JOB#: [id number]  cc: Gladstone Lighter, MD, <Dictator> Danelle Berry. Derrek Monaco, MD Gladstone Lighter  MD ELECTRONICALLY SIGNED 03/14/2013 18:00

## 2014-12-08 NOTE — Discharge Summary (Signed)
PATIENT NAME:  Mario Blackburn, Mario Blackburn MR#:  [id number] DATE OF BIRTH:  1946-04-15  DATE OF ADMISSION:  01/20/2013 DATE OF DISCHARGE:  01/25/2013  ADMITTING PHYSICIAN:  Dr. Phillips Climes.  DISCHARGING PHYSICIAN: Dr. Gladstone Lighter.   PRIMARY MD: Dr. Derrek Monaco at the [facility] program.   [place]: None.   DISCHARGE DIAGNOSES: 1.  Acute hypoxic respiratory failure, requiring 3 liters home oxygen.  2.  Pneumonia.  3.  Obstructive sleep apnea.  4.  Hypertension.  5.  Diabetes mellitus.  6.  Bipolar disorder, with depression.  7.  Gout.  8.  Hypothyroidism.  9.  Chronic diastolic congestive heart failure.   DISCHARGE MEDICATIONS:  1.  Levothyroxine 50 mcg p.o. daily.  2.  Lasix 20 mg p.o. daily.  3.  Effexor 150 mg p.o. b.i.d.  4.  Lisinopril 40 mg p.o. daily.  5.  Aspirin 81 mg p.o. daily.  6.  Allopurinol 100 mg p.o. daily.  7.  Colcrys  0.6 mg p.o. daily.  8.  Gabapentin 600 mg p.o. b.i.d.  9.  Ergocalciferol 50,000 international units once a month, on the first Monday of every month.  10.  Toprol 25 mg p.o. daily.  11.  Abilify 10 mg p.o. daily.  12.  Omeprazole 20 mg p.o. daily.  13.  Tylenol 650 mg q.4h., p.r.n. for pain or fever.  14.  Glargine insulin 30 units subcutaneous daily at bedtime.  15.  Aspart insulin, sliding scale, 2 units for fingersticks from 151 to 200, 4 units for 201 to 250, 6 units for 251 to 300, 8 units for 301 to 350, 10 units with 351 to 400, and for greater than 400 call M.D.  16.  Atorvastatin 40 mg p.o. daily.  17.  Levaquin 750 mg p.o. daily for 3 more days.  18.  Potassium chloride 20 mEq p.o. daily.  19.  Senna 1 tablet b.i.d. p.r.n., for constipation.  20.  Polyethylene glycol powder, 17 grams daily p.r.n. for constipation.  21. Amlodipine 10 mg p.o. daily.   LABS AND IMAGING STUDIES: Prior to discharge sodium 138, potassium 3.9, chloride 99, bicarb 33, BUN 22, creatinine 1.16, glucose 115, calcium of 9.0.   WBC 6.4,  hemoglobin 12.9, hematocrit 38.1, platelet count is 166.   His echo Doppler showing normal LV systolic function, EF is 92% to 55%, mildly increased, left ventricular posterior wall thickness, and also has diastolic dysfunction.   Chest x-ray showing cardiomegaly, pulmonary vascular congestion, interstitial edema.   CT of the abdomen and pelvis showing left inguinal hernia, right gluteal lipoma, atherosclerotic calcification which is scattered, and lung base atelectasis. There is no acute changes seen.   Urinalysis negative for infection. Blood cultures have remained negative.   BRIEF HOSPITAL COURSE: Mario Blackburn is a 69 year old obese Caucasian male with a past medical history significant for diabetes, hypertension, obstructive sleep apnea, coronary artery disease, bipolar and depression, who is from the [facility], was brought in secondary to difficulty urinating, and also a cough with dyspnea. The patient was febrile, hypoxic when he presented, and a chest x-ray was not clear for an infiltrate because of vascular congestion.   1.  Acute hypoxic respiratory failure, likely secondary to pneumonia: His blood cultures were negative. He was started on Levaquin for community-acquired pneumonia. The patient also has underlying sleep apnea and has been noncompliant to his CPAP. He was never tested for oxygen and is requiring oxygen here, and will be discharged on 3 liters O2. He remained 87% on  room air prior to discharge. His breathing is improved. He feels better. He is on Levaquin and will finish up the course in 3 more days.  2. Urinary incontinence, secondary to poor functional status and obesity. Urinalysis was negative for any infection. No Foley catheter, and has been doing well here.  3.  Hypertension: He is being discharged on Lasix, lisinopril, Toprol and Norvasc.  4.  Uncontrolled diabetes mellitus: He is on Lantus and sliding-scale insulin.  5.  Bipolar, with depression: He has a flat  affect. No suicidal ideation. He is on high-dose  Effexor and Abilify. He was following with a psychiatrist with [facility], and recommended outpatient followup as well in 1 to 2 weeks.  6.  Chronic diastolic CHF: Probably had a little bit of acute CHF on presentation. Is well-compensated now. He will be discharged on Lasix and potassium supplements. Echo did not show any wall motion abnormalities. EF was 50% to  55%.  7.  He did work with physical therapy who recommended short-term rehab for the patient, and the patient will be discharged to rehab.   DISCHARGE CONDITION: Stable.   DISCHARGE DISPOSITION: Short-term rehab.   Time spent on discharge: 45 minutes.    ____________________________ Gladstone Lighter, MD rk:dm D: 01/25/2013 13:05:46 ET T: 01/25/2013 14:03:18 ET JOB#: [id number]  cc: Gladstone Lighter, MD, <Dictator> Danelle Berry. Derrek Monaco, MD Gladstone Lighter MD ELECTRONICALLY SIGNED 02/07/2013 13:47

## 2015-04-27 ENCOUNTER — Encounter: Payer: Self-pay | Admitting: Emergency Medicine

## 2015-04-27 ENCOUNTER — Observation Stay
Admission: EM | Admit: 2015-04-27 | Discharge: 2015-04-28 | Disposition: A | Discharge status: Home / Self Care | Payer: Medicare (Managed Care) | Attending: Internal Medicine | Admitting: Internal Medicine

## 2015-04-27 ENCOUNTER — Emergency Department: Payer: Medicare (Managed Care)

## 2015-04-27 DIAGNOSIS — R918 Other nonspecific abnormal finding of lung field: Secondary | ICD-10-CM | POA: Insufficient documentation

## 2015-04-27 DIAGNOSIS — Z880 Allergy status to penicillin: Secondary | ICD-10-CM | POA: Insufficient documentation

## 2015-04-27 DIAGNOSIS — Z882 Allergy status to sulfonamides status: Secondary | ICD-10-CM | POA: Diagnosis not present

## 2015-04-27 DIAGNOSIS — J45909 Unspecified asthma, uncomplicated: Secondary | ICD-10-CM | POA: Insufficient documentation

## 2015-04-27 DIAGNOSIS — I10 Essential (primary) hypertension: Secondary | ICD-10-CM | POA: Insufficient documentation

## 2015-04-27 DIAGNOSIS — G4733 Obstructive sleep apnea (adult) (pediatric): Secondary | ICD-10-CM | POA: Insufficient documentation

## 2015-04-27 DIAGNOSIS — Z794 Long term (current) use of insulin: Secondary | ICD-10-CM | POA: Diagnosis not present

## 2015-04-27 DIAGNOSIS — Z8673 Personal history of transient ischemic attack (TIA), and cerebral infarction without residual deficits: Secondary | ICD-10-CM | POA: Insufficient documentation

## 2015-04-27 DIAGNOSIS — L03115 Cellulitis of right lower limb: Secondary | ICD-10-CM | POA: Diagnosis not present

## 2015-04-27 DIAGNOSIS — I509 Heart failure, unspecified: Secondary | ICD-10-CM | POA: Insufficient documentation

## 2015-04-27 DIAGNOSIS — E669 Obesity, unspecified: Secondary | ICD-10-CM | POA: Diagnosis not present

## 2015-04-27 DIAGNOSIS — M1990 Unspecified osteoarthritis, unspecified site: Secondary | ICD-10-CM | POA: Diagnosis not present

## 2015-04-27 DIAGNOSIS — E119 Type 2 diabetes mellitus without complications: Secondary | ICD-10-CM | POA: Insufficient documentation

## 2015-04-27 DIAGNOSIS — I4510 Unspecified right bundle-branch block: Secondary | ICD-10-CM | POA: Diagnosis not present

## 2015-04-27 DIAGNOSIS — R079 Chest pain, unspecified: Principal | ICD-10-CM | POA: Insufficient documentation

## 2015-04-27 DIAGNOSIS — Z87891 Personal history of nicotine dependence: Secondary | ICD-10-CM | POA: Diagnosis not present

## 2015-04-27 DIAGNOSIS — E785 Hyperlipidemia, unspecified: Secondary | ICD-10-CM | POA: Insufficient documentation

## 2015-04-27 DIAGNOSIS — M109 Gout, unspecified: Secondary | ICD-10-CM | POA: Diagnosis not present

## 2015-04-27 HISTORY — DX: Malignant (primary) neoplasm, unspecified: C80.1

## 2015-04-27 HISTORY — DX: Hyperlipidemia, unspecified: E78.5

## 2015-04-27 HISTORY — DX: Type 2 diabetes mellitus without complications: E11.9

## 2015-04-27 HISTORY — DX: Unspecified osteoarthritis, unspecified site: M19.90

## 2015-04-27 HISTORY — DX: Gout, unspecified: M10.9

## 2015-04-27 HISTORY — DX: Essential (primary) hypertension: I10

## 2015-04-27 HISTORY — DX: Cerebral infarction, unspecified: I63.9

## 2015-04-27 HISTORY — DX: Unspecified asthma, uncomplicated: J45.909

## 2015-04-27 HISTORY — DX: Heart failure, unspecified: I50.9

## 2015-04-27 LAB — BASIC METABOLIC PANEL
ANION GAP: 12 (ref 5–15)
BUN: 16 mg/dL (ref 6–20)
CO2: 28 mmol/L (ref 22–32)
Calcium: 9.1 mg/dL (ref 8.9–10.3)
Chloride: 99 mmol/L — ABNORMAL LOW (ref 101–111)
Creatinine, Ser: 0.98 mg/dL (ref 0.61–1.24)
Glucose, Bld: 134 mg/dL — ABNORMAL HIGH (ref 65–99)
POTASSIUM: 4 mmol/L (ref 3.5–5.1)
SODIUM: 139 mmol/L (ref 135–145)

## 2015-04-27 LAB — CBC WITH DIFFERENTIAL/PLATELET
BASOS ABS: 0.1 10*3/uL (ref 0–0.1)
BASOS PCT: 1 %
Eosinophils Absolute: 0.3 10*3/uL (ref 0–0.7)
Eosinophils Relative: 3 %
HEMATOCRIT: 37.2 % — AB (ref 40.0–52.0)
Hemoglobin: 12 g/dL — ABNORMAL LOW (ref 13.0–18.0)
Lymphocytes Relative: 38 %
Lymphs Abs: 3.6 10*3/uL (ref 1.0–3.6)
MCH: 28.3 pg (ref 26.0–34.0)
MCHC: 32.2 g/dL (ref 32.0–36.0)
MCV: 87.9 fL (ref 80.0–100.0)
MONO ABS: 1.1 10*3/uL — AB (ref 0.2–1.0)
Monocytes Relative: 12 %
NEUTROS ABS: 4.4 10*3/uL (ref 1.4–6.5)
Neutrophils Relative %: 46 %
PLATELETS: 196 10*3/uL (ref 150–440)
RBC: 4.23 MIL/uL — AB (ref 4.40–5.90)
RDW: 16.5 % — AB (ref 11.5–14.5)
WBC: 9.5 10*3/uL (ref 3.8–10.6)

## 2015-04-27 LAB — HEMOGLOBIN A1C
Hgb A1c MFr Bld: 7.1 % — ABNORMAL HIGH (ref 4.0–6.0)
Hgb A1c MFr Bld: 7.1 % — ABNORMAL HIGH (ref 4.0–6.0)

## 2015-04-27 LAB — GLUCOSE, CAPILLARY
GLUCOSE-CAPILLARY: 109 mg/dL — AB (ref 65–99)
GLUCOSE-CAPILLARY: 162 mg/dL — AB (ref 65–99)

## 2015-04-27 LAB — TROPONIN I: Troponin I: <0.03 ng/mL (ref <0.031)

## 2015-04-27 MED ORDER — ALLOPURINOL 100 MG PO TABS
100.0000 mg | ORAL_TABLET | Freq: Every day | ORAL | Status: DC
  Administered 2015-04-27 – 2015-04-28 (×2): 100 mg via ORAL
  Filled 2015-04-27 (×2): qty 1

## 2015-04-27 MED ORDER — GUAIFENESIN 100 MG/5ML PO SYRP
100.0000 mg | ORAL_SOLUTION | ORAL | Status: DC | PRN
  Filled 2015-04-27: qty 10

## 2015-04-27 MED ORDER — ASPIRIN EC 81 MG PO TBEC: 81.0000 mg | DELAYED_RELEASE_TABLET | Freq: Every day | ORAL | Status: DC

## 2015-04-27 MED ORDER — GABAPENTIN 300 MG PO CAPS
300.0000 mg | ORAL_CAPSULE | Freq: Every day | ORAL | Status: DC
  Administered 2015-04-27: 300 mg via ORAL
  Filled 2015-04-27: qty 1

## 2015-04-27 MED ORDER — LEVOTHYROXINE SODIUM 50 MCG PO TABS
50.0000 ug | ORAL_TABLET | Freq: Every day | ORAL | Status: DC
  Administered 2015-04-28: 50 ug via ORAL

## 2015-04-27 MED ORDER — SALINE SPRAY 0.65 % NA SOLN
2.0000 | NASAL | Status: DC | PRN
  Filled 2015-04-27: qty 44

## 2015-04-27 MED ORDER — SODIUM CHLORIDE 0.9 % IJ SOLN
3.0000 mL | Freq: Two times a day (BID) | INTRAMUSCULAR | Status: DC
  Administered 2015-04-27 – 2015-04-28 (×3): 3 mL via INTRAVENOUS

## 2015-04-27 MED ORDER — LISINOPRIL 20 MG PO TABS
40.0000 mg | ORAL_TABLET | Freq: Every day | ORAL | Status: DC
  Administered 2015-04-27 – 2015-04-28 (×2): 40 mg via ORAL
  Filled 2015-04-27 (×2): qty 2

## 2015-04-27 MED ORDER — PANTOPRAZOLE SODIUM 40 MG PO TBEC
40.0000 mg | DELAYED_RELEASE_TABLET | Freq: Every day | ORAL | Status: DC
Start: 2015-04-27 — End: 2015-04-28
  Administered 2015-04-27 – 2015-04-28 (×2): 40 mg via ORAL
  Filled 2015-04-27 (×2): qty 1

## 2015-04-27 MED ORDER — FUROSEMIDE 40 MG PO TABS
40.0000 mg | ORAL_TABLET | Freq: Every day | ORAL | Status: DC
  Administered 2015-04-27 – 2015-04-28 (×2): 40 mg via ORAL
  Filled 2015-04-27 (×2): qty 1

## 2015-04-27 MED ORDER — INSULIN ASPART 100 UNIT/ML ~~LOC~~ SOLN
10.0000 [IU] | Freq: Three times a day (TID) | SUBCUTANEOUS | Status: DC
  Administered 2015-04-28: 10 [IU] via SUBCUTANEOUS
  Filled 2015-04-27: qty 10

## 2015-04-27 MED ORDER — ZINC OXIDE 40 % EX OINT
1.0000 "application " | TOPICAL_OINTMENT | Freq: Every day | CUTANEOUS | Status: DC | PRN
  Filled 2015-04-27: qty 56

## 2015-04-27 MED ORDER — TIOTROPIUM BROMIDE MONOHYDRATE 18 MCG IN CAPS
18.0000 ug | ORAL_CAPSULE | Freq: Every day | RESPIRATORY_TRACT | Status: DC
  Administered 2015-04-27 – 2015-04-28 (×2): 18 ug via RESPIRATORY_TRACT
  Filled 2015-04-27: qty 5

## 2015-04-27 MED ORDER — INSULIN GLARGINE 100 UNIT/ML ~~LOC~~ SOLN
35.0000 [IU] | Freq: Every day | SUBCUTANEOUS | Status: DC
  Administered 2015-04-27: 35 [IU] via SUBCUTANEOUS
  Filled 2015-04-27 (×3): qty 0.35

## 2015-04-27 MED ORDER — ATORVASTATIN CALCIUM 20 MG PO TABS
80.0000 mg | ORAL_TABLET | Freq: Every day | ORAL | Status: DC
  Administered 2015-04-27: 80 mg via ORAL
  Filled 2015-04-27: qty 4

## 2015-04-27 MED ORDER — ARIPIPRAZOLE 10 MG PO TABS
10.0000 mg | ORAL_TABLET | Freq: Every day | ORAL | Status: DC
  Administered 2015-04-27 – 2015-04-28 (×2): 10 mg via ORAL
  Filled 2015-04-27 (×2): qty 1

## 2015-04-27 MED ORDER — CLINDAMYCIN HCL 150 MG PO CAPS
300.0000 mg | ORAL_CAPSULE | Freq: Three times a day (TID) | ORAL | Status: DC
  Administered 2015-04-27 – 2015-04-28 (×3): 300 mg via ORAL
  Filled 2015-04-27 (×3): qty 2

## 2015-04-27 MED ORDER — METOPROLOL SUCCINATE ER 25 MG PO TB24
25.0000 mg | ORAL_TABLET | Freq: Every day | ORAL | Status: DC
  Administered 2015-04-27 – 2015-04-28 (×2): 25 mg via ORAL
  Filled 2015-04-27 (×2): qty 1

## 2015-04-27 MED ORDER — INSULIN ASPART 100 UNIT/ML ~~LOC~~ SOLN
6.0000 [IU] | Freq: Three times a day (TID) | SUBCUTANEOUS | Status: DC
  Administered 2015-04-28: 6 [IU] via SUBCUTANEOUS
  Filled 2015-04-27: qty 6

## 2015-04-27 MED ORDER — ACETAMINOPHEN 650 MG RE SUPP: 650.0000 mg | Freq: Four times a day (QID) | RECTAL | Status: DC | PRN

## 2015-04-27 MED ORDER — CADEXOMER IODINE 0.9 % EX GEL: 1.0000 "application " | Freq: Every day | CUTANEOUS | Status: DC | PRN

## 2015-04-27 MED ORDER — KETOCONAZOLE 2 % EX SHAM
1.0000 "application " | MEDICATED_SHAMPOO | CUTANEOUS | Status: DC
  Filled 2015-04-27: qty 120

## 2015-04-27 MED ORDER — NYSTATIN-TRIAMCINOLONE 100000-0.1 UNIT/GM-% EX CREA
1.0000 "application " | TOPICAL_CREAM | Freq: Two times a day (BID) | CUTANEOUS | Status: DC
  Administered 2015-04-27 – 2015-04-28 (×3): 1 via TOPICAL
  Filled 2015-04-27: qty 15

## 2015-04-27 MED ORDER — ASPIRIN 81 MG PO CHEW
CHEWABLE_TABLET | ORAL | Status: AC
  Filled 2015-04-27: qty 4

## 2015-04-27 MED ORDER — MOMETASONE FURO-FORMOTEROL FUM 200-5 MCG/ACT IN AERO
2.0000 | INHALATION_SPRAY | Freq: Two times a day (BID) | RESPIRATORY_TRACT | Status: DC
  Administered 2015-04-27 – 2015-04-28 (×2): 2 via RESPIRATORY_TRACT
  Filled 2015-04-27: qty 8.8

## 2015-04-27 MED ORDER — MORPHINE SULFATE (PF) 2 MG/ML IV SOLN: 2.0000 mg | INTRAVENOUS | Status: DC | PRN

## 2015-04-27 MED ORDER — ONDANSETRON 4 MG PO TBDP
4.0000 mg | ORAL_TABLET | Freq: Three times a day (TID) | ORAL | Status: DC | PRN
  Filled 2015-04-27: qty 1

## 2015-04-27 MED ORDER — ASPIRIN EC 325 MG PO TBEC
325.0000 mg | DELAYED_RELEASE_TABLET | Freq: Every day | ORAL | Status: DC
  Administered 2015-04-28: 325 mg via ORAL
  Filled 2015-04-27: qty 1

## 2015-04-27 MED ORDER — AMLODIPINE-ATORVASTATIN 10-80 MG PO TABS: 1.0000 | ORAL_TABLET | Freq: Every day | ORAL | Status: DC

## 2015-04-27 MED ORDER — AMLODIPINE BESYLATE 10 MG PO TABS
10.0000 mg | ORAL_TABLET | Freq: Every day | ORAL | Status: DC
  Administered 2015-04-27 – 2015-04-28 (×2): 10 mg via ORAL
  Filled 2015-04-27 (×2): qty 1

## 2015-04-27 MED ORDER — VENLAFAXINE HCL ER 150 MG PO CP24
150.0000 mg | ORAL_CAPSULE | Freq: Two times a day (BID) | ORAL | Status: DC
  Administered 2015-04-27 – 2015-04-28 (×3): 150 mg via ORAL
  Filled 2015-04-27 (×5): qty 1

## 2015-04-27 MED ORDER — LATANOPROST 0.005 % OP SOLN
1.0000 [drp] | Freq: Every day | OPHTHALMIC | Status: DC
  Administered 2015-04-27: 1 [drp] via OPHTHALMIC
  Filled 2015-04-27: qty 2.5

## 2015-04-27 MED ORDER — POTASSIUM CHLORIDE ER 8 MEQ PO TBCR
8.0000 meq | EXTENDED_RELEASE_TABLET | Freq: Every day | ORAL | Status: DC
  Administered 2015-04-27 – 2015-04-28 (×2): 8 meq via ORAL
  Filled 2015-04-27 (×2): qty 1

## 2015-04-27 MED ORDER — LIDOCAINE 5 % EX PTCH
1.0000 | MEDICATED_PATCH | Freq: Every day | CUTANEOUS | Status: DC | PRN
  Filled 2015-04-27: qty 1

## 2015-04-27 MED ORDER — MONTELUKAST SODIUM 10 MG PO TABS
10.0000 mg | ORAL_TABLET | Freq: Every day | ORAL | Status: DC
  Administered 2015-04-27: 10 mg via ORAL
  Filled 2015-04-27: qty 1

## 2015-04-27 MED ORDER — NITROGLYCERIN 2 % TD OINT
1.0000 [in_us] | TOPICAL_OINTMENT | Freq: Four times a day (QID) | TRANSDERMAL | Status: DC
  Administered 2015-04-27 – 2015-04-28 (×4): 1 [in_us] via TOPICAL
  Filled 2015-04-27 (×4): qty 1

## 2015-04-27 MED ORDER — INSULIN ASPART 100 UNIT/ML ~~LOC~~ SOLN
0.0000 [IU] | Freq: Three times a day (TID) | SUBCUTANEOUS | Status: DC
  Administered 2015-04-28: 4 [IU] via SUBCUTANEOUS
  Filled 2015-04-27: qty 4

## 2015-04-27 MED ORDER — INSULIN ASPART 100 UNIT/ML ~~LOC~~ SOLN: 0.0000 [IU] | Freq: Every day | SUBCUTANEOUS | Status: DC

## 2015-04-27 MED ORDER — BACITRACIN-NEOMYCIN-POLYMYXIN 400-5-5000 EX OINT
TOPICAL_OINTMENT | Freq: Every day | CUTANEOUS | Status: DC | PRN
  Filled 2015-04-27: qty 1

## 2015-04-27 MED ORDER — ALBUTEROL SULFATE (2.5 MG/3ML) 0.083% IN NEBU: 2.5000 mg | INHALATION_SOLUTION | RESPIRATORY_TRACT | Status: DC | PRN

## 2015-04-27 MED ORDER — DIVALPROEX SODIUM 250 MG PO DR TAB
750.0000 mg | DELAYED_RELEASE_TABLET | Freq: Two times a day (BID) | ORAL | Status: DC
  Administered 2015-04-27 – 2015-04-28 (×3): 750 mg via ORAL
  Filled 2015-04-27 (×3): qty 3

## 2015-04-27 MED ORDER — ASPIRIN EC 325 MG PO TBEC
325.0000 mg | DELAYED_RELEASE_TABLET | Freq: Once | ORAL | Status: DC
  Filled 2015-04-27: qty 1

## 2015-04-27 MED ORDER — ENOXAPARIN SODIUM 40 MG/0.4ML ~~LOC~~ SOLN
40.0000 mg | Freq: Two times a day (BID) | SUBCUTANEOUS | Status: DC
  Administered 2015-04-27: 40 mg via SUBCUTANEOUS
  Filled 2015-04-27: qty 0.4

## 2015-04-27 MED ORDER — FAMOTIDINE 20 MG PO TABS
40.0000 mg | ORAL_TABLET | Freq: Once | ORAL | Status: AC
  Administered 2015-04-27: 40 mg via ORAL
  Filled 2015-04-27: qty 2

## 2015-04-27 MED ORDER — ACETAMINOPHEN 325 MG PO TABS: 650.0000 mg | ORAL_TABLET | Freq: Four times a day (QID) | ORAL | Status: DC | PRN

## 2015-04-27 NOTE — ED Notes (Signed)
Pt from home via ems with c/o chest pain that states he woke up with chest pain took an antiacid and states it felt better. Chest pain came back about 6 this am, pt states it came back again about 1030 while at his doctors office. Pt was given nitro in route pt states it helped a lot brought the pain down to a 3/10. 18g left AC was placed.

## 2015-04-27 NOTE — Progress Notes (Signed)
ANTICOAGULATION CONSULT NOTE - Initial Consult  Pharmacy Consult for Lovenox  Indication: VTE prophylaxis  Allergies  Allergen Reactions  . Morphine And Related Other (See Comments)    Pt states that this medication put him in a coma.  . Percocet [Oxycodone-Acetaminophen] Other (See Comments)    Pt states that this medication put him in a coma.  . Bactrim [Sulfamethoxazole-Trimethoprim] Other (See Comments)    Reaction:  Unknown   . Benadryl [Diphenhydramine Hcl] Hives  . Keflex [Cephalexin] Hives  . Penicillins Hives    Patient Measurements: Height: 5\' 9"  (175.3 cm) Weight: (!) 358 lb 11.2 oz (162.705 kg) IBW/kg (Calculated) : 70.7 Heparin Dosing Weight:   Vital Signs: Temp: 98 F (36.7 C) (09/09 1146) Temp Source: Oral (09/09 1146) BP: 134/67 mmHg (09/09 1457) Pulse Rate: 75 (09/09 1457)  Labs:  Recent Labs  04/27/15 1147  HGB 12.0*  HCT 37.2*  PLT 196  CREATININE 0.98  TROPONINI <0.03    Estimated Creatinine Clearance: 109.7 mL/min (by C-G formula based on Cr of 0.98).   Medical History: Past Medical History  Diagnosis Date  . Arthritis   . Asthma   . CHF (congestive heart failure)   . Hypertension   . Stroke   . Diabetes mellitus without complication   . Osteoarthritis   . Cancer   . Hyperlipemia   . Gout     Medications:  Prescriptions prior to admission  Medication Sig Dispense Refill Last Dose  . acetaminophen (TYLENOL) 650 MG CR tablet Take 1,300 mg by mouth 2 (two) times daily.   unknown at unknown  . albuterol (PROVENTIL HFA;VENTOLIN HFA) 108 (90 BASE) MCG/ACT inhaler Inhale 2 puffs into the lungs every 4 (four) hours as needed for wheezing or shortness of breath.   PRN at PRN  . albuterol (PROVENTIL) (2.5 MG/3ML) 0.083% nebulizer solution Take 2.5 mg by nebulization 4 (four) times daily as needed for wheezing or shortness of breath.   PRN at PRN  . allopurinol (ZYLOPRIM) 100 MG tablet Take 100 mg by mouth daily.   unknown at unknown  .  amLODipine-atorvastatin (CADUET) 10-80 MG per tablet Take 1 tablet by mouth at bedtime.   unknown at unknown  . ARIPiprazole (ABILIFY) 10 MG tablet Take 10 mg by mouth daily.   unknown at unknown  . aspirin EC 81 MG tablet Take 81 mg by mouth daily.   unknown at unknown  . bimatoprost (LUMIGAN) 0.01 % SOLN Place 1 drop into both eyes at bedtime.   unknown at unknown  . brimonidine-timolol (COMBIGAN) 0.2-0.5 % ophthalmic solution Place 1 drop into both eyes 2 (two) times daily.   unknown at unknown  . cadexomer iodine (IODOSORB) 0.9 % gel Apply 1 application topically daily as needed for wound care.   PRN at PRN  . divalproex (DEPAKOTE) 250 MG DR tablet Take 750 mg by mouth 2 (two) times daily.   unknown at unknown  . fluticasone-salmeterol (ADVAIR HFA) 230-21 MCG/ACT inhaler Inhale 1 puff into the lungs 2 (two) times daily.   unknown at unknown  . furosemide (LASIX) 40 MG tablet Take 40 mg by mouth.   unknown at unknown  . gabapentin (NEURONTIN) 300 MG capsule Take 300 mg by mouth at bedtime.   unknown at unknown  . guaifenesin (ROBITUSSIN) 100 MG/5ML syrup Take 100-200 mg by mouth every 4 (four) hours as needed for cough.   PRN at PRN  . insulin aspart (NOVOLOG) 100 UNIT/ML injection Inject 10 Units into the skin  3 (three) times daily before meals.   unknown at unknown  . insulin glargine (LANTUS) 100 UNIT/ML injection Inject 35 Units into the skin at bedtime.   unknown at unknown  . ketoconazole (NIZORAL) 2 % shampoo Apply 1 application topically 3 (three) times a week.   unknown at unknown  . levothyroxine (SYNTHROID, LEVOTHROID) 50 MCG tablet Take 50 mcg by mouth daily before breakfast.   unknown at unknown  . lidocaine (LIDODERM) 5 % Place 0.5 patches onto the skin daily as needed (for pain). Pt applies half a patch to right shoulder and half a patch to lower back.   Remove & Discard patch within 12 hours or as directed by MD   unknown at unknown  . lisinopril (PRINIVIL,ZESTRIL) 40 MG tablet  Take 40 mg by mouth daily.   unknown at unknown  . metoprolol succinate (TOPROL-XL) 25 MG 24 hr tablet Take 25 mg by mouth daily.   unknown at unknown  . montelukast (SINGULAIR) 10 MG tablet Take 10 mg by mouth at bedtime.   unknown at unknown  . nitroGLYCERIN (NITROSTAT) 0.4 MG SL tablet Place 0.4 mg under the tongue every 5 (five) minutes as needed for chest pain.   PRN at PRN  . nystatin-triamcinolone (MYCOLOG II) cream Apply 1 application topically 2 (two) times daily.   unknown at unknown  . omeprazole (PRILOSEC) 20 MG capsule Take 20 mg by mouth daily.   unknown at unknown  . ondansetron (ZOFRAN-ODT) 4 MG disintegrating tablet Take 4 mg by mouth every 8 (eight) hours as needed for nausea or vomiting.   PRN at PRN  . potassium chloride (KLOR-CON) 8 MEQ tablet Take 8 mEq by mouth daily.   unknown at unknown  . sodium chloride (OCEAN) 0.65 % SOLN nasal spray Place 2 sprays into both nostrils as needed for congestion.   PRN at PRN  . tiotropium (SPIRIVA) 18 MCG inhalation capsule Place 18 mcg into inhaler and inhale daily.   unknown at unknown  . venlafaxine XR (EFFEXOR-XR) 150 MG 24 hr capsule Take 150 mg by mouth 2 (two) times daily.   unknown at unknown  . Vitamin D, Ergocalciferol, (DRISDOL) 50000 UNITS CAPS capsule Take 50,000 Units by mouth every 30 (thirty) days.   unknown at unknown  . Zinc Oxide (DESITIN) 13 % CREA Apply 1 application topically daily as needed (for irritation).   PRN at PRN    Assessment: CrCl = 109.7 ml/min BMI = 54  Goal of Therapy:  DVT prophylaxis   Plan:  Lovenox 40 mg SQ Q24H originally ordered.  Will adjust dose Lovenox 40 mg SQ Q12H based on BMI > 40.  Douglas Smolinsky D 04/27/2015,3:42 PM

## 2015-04-27 NOTE — H&P (Addendum)
[facility] [place]    MR#:  [id number]  DATE OF BIRTH:  20-Feb-1946  DATE OF ADMISSION:  04/27/2015  PRIMARY CARE PHYSICIAN: Pcp Not In System   REQUESTING/REFERRING PHYSICIAN:   CHIEF COMPLAINT:   Chief Complaint  Patient presents with  . Chest Pain    HISTORY OF PRESENT ILLNESS: Mario Blackburn  is a 69 y.o. male with a known history of multiple medical problems including asthma, CHF, hypertension, stroke, diabetes, osteoarthritis, hyperlipidemia, obesity, obstructive sleep apnea who presents to the hospital with complaints of chest pains. However, new patient. He was doing well up until 4 AM today in the morning he woke up because of lower sternal left-sided chest pain. Pain was described as sharp pain 10 out of 10 by intensity which lasted approximately 10 minutes, improved with heartburn medications which he's daughter gave him it went away, however, returned back at around 6:30 AM and then again at 9 AM it was accompanied by cold sweats, shortness of breath. Patient denied any radiation of this pain , but stated that the deep breathing improved it. He presented to emergency room for further evaluation. His EKG showed sinus rhythm with premature atrial complexes and aberrant conduction at 78 bpm, right bundle branch block, nonspecific ST-T changes, poor quality EKG. , Chest x-ray showed low lung volumes. First troponin was normal. Hospitalist services contacted for admission  PAST MEDICAL HISTORY:   Past Medical History  Diagnosis Date  . Arthritis   . Asthma   . CHF (congestive heart failure)   . Hypertension   . Stroke   . Diabetes mellitus without complication   . Osteoarthritis   . Cancer   . Hyperlipemia   . Gout     PAST SURGICAL HISTORY:  Past Surgical History  Procedure Laterality Date  . Cholecystectomy      SOCIAL HISTORY:  Social History  Substance Use Topics  . Smoking status: Former  Research scientist (life sciences)  . Smokeless tobacco: Not on file  . Alcohol Use: No    FAMILY HISTORY:  Family History  Problem Relation Age of Onset  . Family history unknown: Yes    DRUG ALLERGIES:  Allergies  Allergen Reactions  . Morphine And Related Other (See Comments)    Patient states this medication puts him in a coma.  Marland Kitchen Percocet [Oxycodone-Acetaminophen] Other (See Comments)    Patient states this medication put him in a coma.  . Bactrim [Sulfamethoxazole-Trimethoprim] Other (See Comments)    Pt unsure about the reactions.  . Benadryl [Diphenhydramine Hcl] Other (See Comments)    whelps  . Keflex [Cephalexin] Other (See Comments)    whelps  . Penicillins Other (See Comments)    whelps    Review of Systems  Constitutional: Positive for diaphoresis. Negative for fever, chills, weight loss and malaise/fatigue.  HENT: Negative for congestion.   Eyes: Negative for blurred vision and double vision.  Respiratory: Positive for cough, sputum production, shortness of breath and wheezing.   Cardiovascular: Positive for chest pain, palpitations, leg swelling and PND. Negative for orthopnea.  Gastrointestinal: Positive for heartburn and constipation. Negative for nausea, vomiting, abdominal pain, diarrhea, blood in stool and melena.  Genitourinary: Negative for dysuria, urgency, frequency and hematuria.  Musculoskeletal: Negative for falls.  Skin: Negative for rash.  Neurological: Negative for dizziness and weakness.  Psychiatric/Behavioral: Negative for depression and memory loss. The patient is not nervous/anxious.     MEDICATIONS AT HOME:  Prior  to Admission medications   Not on File      PHYSICAL EXAMINATION:   VITAL SIGNS: Blood pressure 155/70, pulse 79, temperature 98 F (36.7 C), temperature source Oral, resp. rate 18, height 5\' 9"  (1.753 m), weight 165.563 kg (365 lb), SpO2 98 %.  GENERAL:  69 y.o.-year-old morbidly obese patient lying in the bed with no acute distress.  EYES:  Pupils equal, round, reactive to light and accommodation. No scleral icterus. Extraocular muscles intact.  HEENT: Head atraumatic, normocephalic. Oropharynx and nasopharynx clear.  NECK:  Supple, no jugular venous distention. No thyroid enlargement, no tenderness.  LUNGS: Normal breath sounds bilaterally, no wheezing, rales,rhonchi or crepitation. No use of accessory muscles of respiration.  CARDIOVASCULAR: S1, S2 normal. No murmurs, rubs, or gallops.  ABDOMEN: Soft, nontender, nondistended. Bowel sounds present. No organomegaly or mass.  EXTREMITIES: Severe lower extremity induration and pedal edema, no cyanosis, or clubbing. Erythema with increased warmth is noted in bilateral lower extremities, mostly on the right side, 1-2+ pedal edema NEUROLOGIC: Cranial nerves II through XII are intact. Muscle strength 5/5 in all extremities. Sensation intact. Gait not checked.  PSYCHIATRIC: The patient is alert and oriented x 3.  SKIN: No obvious rash, lesion, or ulcer. Ulcerative lesion was noted and the tip of patient's nose.   LABORATORY PANEL:   CBC  Recent Labs Lab 04/27/15 1147  WBC 9.5  HGB 12.0*  HCT 37.2*  PLT 196  MCV 87.9  MCH 28.3  MCHC 32.2  RDW 16.5*  LYMPHSABS 3.6  MONOABS 1.1*  EOSABS 0.3  BASOSABS 0.1   ------------------------------------------------------------------------------------------------------------------  Chemistries   Recent Labs Lab 04/27/15 1147  NA 139  K 4.0  CL 99*  CO2 28  GLUCOSE 134*  BUN 16  CREATININE 0.98  CALCIUM 9.1   ------------------------------------------------------------------------------------------------------------------  Cardiac Enzymes  Recent Labs Lab 04/27/15 1147  TROPONINI <0.03   ------------------------------------------------------------------------------------------------------------------  RADIOLOGY: Dg Chest Port 1 View  04/27/2015   CLINICAL DATA:  Left-sided chest pain.  EXAM: PORTABLE CHEST - 1 VIEW   COMPARISON:  03/05/2013  FINDINGS: Portable view of the chest demonstrates low lung volumes. Vascular crowding secondary to low lung volumes. No focal airspace disease. Heart size is stable. Trachea is midline.  IMPRESSION: Low lung volumes without focal disease.   Electronically Signed   By: Markus Daft M.D.   On: 04/27/2015 12:09    EKG: Orders placed or performed during the hospital encounter of 04/27/15  . ED EKG  . ED EKG  . EKG 12-Lead  . EKG 12-Lead  . ED EKG  . ED EKG    IMPRESSION AND PLAN:  Principal Problem:   Chest pain 1. Recurrent chest pain. Admit patient to medical floor. Sodium 1 metoprolol, nitroglycerin, Lovenox and aspirin. Check cardiac enzymes 3. Get cardiology consultation and Myoview stress test in the morning if possible 2. Hypertension. Continue metoprolol as well as nitroglycerin , patient's medication list is unclear at this point 3. History of congestive heart failure seemed to be stable. No exacerbation 4. Diabetes mellitus without complications. Continue patient on his current medications as well as sliding scale insulin and diabetic diet 5 Right lower extremity cellulitis, start Clindamycin po   All the records are reviewed and case discussed with ED provider. Management plans discussed with the patient, family and they are in agreement.  CODE STATUS: Full code   TOTAL TIME TAKING CARE OF THIS PATIENT: 55  minutes.    Theodoro Grist M.D on 04/27/2015 at 1:17 PM  Between  7am to 6pm - Pager - [phone] After 6pm go to [email or web address] - password [email or web address] [place] [facility]  Office  [phone]  CC: Primary care physician; Pcp Not In System

## 2015-04-27 NOTE — ED Provider Notes (Addendum)
[facility] Emergency Department Provider Note  ____________________________________________  Time seen: 11:30 AM on arrival by EMS  I have reviewed the triage vital signs and the nursing notes.   HISTORY  Chief Complaint Chest Pain    HPI Mario Blackburn is a 69 y.o. male who complains of multiple episodes of chest pain today. The first episode was about 4:00 AM, lasted for about 30 minutes and resolved after taking some Tums. Tender reoccurred at 6:30 AM and again resolved after some Tums. It occurred again at 9:30 and then again at 10:30 while in the doctor's office. I discussed the case with his primary care doctor, Dr. Raliegh Ip, who observed him in the clinic having chest pain and with it being very short of breath and diaphoretic. She notes that this is not normal for him.She also notes that they have been focusing on diuresis over the past week and he is actually lost 4 pounds over the past week due to diuresis.  In the clinic on arrival by EMS the pain was 7 out of 10. It was starting to abate again it was 3 out of 10 by the time he got to the ambulance. He was given 324 of aspirin and 1 nitroglycerin and his pain resolved.  The patient has chronic erythema and swelling of the bilateral legs which she says is at baseline or better.     Past Medical History  Diagnosis Date  . Arthritis   . Asthma   . CHF (congestive heart failure)   . Hypertension   . Stroke   . Diabetes mellitus without complication   . Osteoarthritis   . Cancer   . Hyperlipemia   . Gout      There are no active problems to display for this patient.    Past Surgical History  Procedure Laterality Date  . Cholecystectomy       No current outpatient prescriptions on file. Printed MAR on chart, includes Lantus, NovoLog, Effexor, Depakote, Neurontin, nitroglycerin, Synthroid, Advair, Ventolin, lisinopril, furosemide  Allergies Morphine and related; Percocet; Bactrim; Benadryl;  Keflex; and Penicillins   Family History  Problem Relation Age of Onset  . Family history unknown: Yes    Social History Social History  Substance Use Topics  . Smoking status: Former Research scientist (life sciences)  . Smokeless tobacco: None  . Alcohol Use: No    Review of Systems  Constitutional:   No fever or chills. 4 pound weight loss over the past week Eyes:   No blurry vision or double vision.  ENT:   No sore throat. Cardiovascular:   Chest pain as above  Respiratory:   No dyspnea or cough. Gastrointestinal:   Negative for abdominal pain, vomiting and diarrhea.  No BRBPR or melena. Genitourinary:   Negative for dysuria, urinary retention, bloody urine, or difficulty urinating. Musculoskeletal:   Chronic bilateral leg swelling right greater than left. Skin:   Negative for rash. Neurological:   Negative for headaches, focal weakness or numbness. Psychiatric:  No anxiety or depression.   Endocrine:  No hot/cold intolerance, changes in energy, or sleep difficulty.  10-point ROS otherwise negative.  ____________________________________________   PHYSICAL EXAM:  VITAL SIGNS: ED Triage Vitals  Enc Vitals Group     BP 04/27/15 1146 155/70 mmHg     Pulse Rate 04/27/15 1146 79     Resp 04/27/15 1146 18     Temp 04/27/15 1146 98 F (36.7 C)     Temp Source 04/27/15 1146 Oral  SpO2 04/27/15 1146 98 %     Weight 04/27/15 1146 365 lb (165.563 kg)     Height 04/27/15 1146 5\' 9"  (1.753 m)     Head Cir --      Peak Flow --      Pain Score 04/27/15 1150 0     Pain Loc --      Pain Edu? --      Excl. in [place]? --      Constitutional:   Alert and oriented. Well appearing and in no distress. Morbidly obese Eyes:   No scleral icterus. No conjunctival pallor. PERRL. EOMI ENT   Head:   Normocephalic and atraumatic.   Nose:   No congestion/rhinnorhea. No septal hematoma   Mouth/Throat:   MMM, no pharyngeal erythema. No peritonsillar mass. No uvula shift.   Neck:   No stridor. No  SubQ emphysema. No meningismus. Hematological/Lymphatic/Immunilogical:   No cervical lymphadenopathy. Cardiovascular:   RRR. Normal and symmetric distal pulses are present in all extremities. No murmurs, rubs, or gallops. Respiratory:   Normal respiratory effort without tachypnea nor retractions. Breath sounds are clear and equal bilaterally. No wheezes/rales/rhonchi. Gastrointestinal:   Soft and nontender. No distention. There is no CVA tenderness.  No rebound, rigidity, or guarding. Genitourinary:   deferred Musculoskeletal:   Pitting edema bilateral lower extremity is, right greater than left. There is brawny edema and discoloration of both legs consistent with chronic venous stasis disease. Neurologic:   Normal speech and language.  CN 2-10 normal. Motor grossly intact. No pronator drift.  Normal gait. No gross focal neurologic deficits are appreciated.  Skin:    Skin is warm, dry and intact. No rash noted.  No petechiae, purpura, or bullae. Psychiatric:   Mood and affect are normal. Speech and behavior are normal. Patient exhibits appropriate insight and judgment.  ____________________________________________    LABS (pertinent positives/negatives) (all labs ordered are listed, but only abnormal results are displayed) Labs Reviewed  BASIC METABOLIC PANEL - Abnormal; Notable for the following:    Chloride 99 (*)    Glucose, Bld 134 (*)    All other components within normal limits  CBC WITH DIFFERENTIAL/PLATELET - Abnormal; Notable for the following:    RBC 4.23 (*)    Hemoglobin 12.0 (*)    HCT 37.2 (*)    RDW 16.5 (*)    Monocytes Absolute 1.1 (*)    All other components within normal limits  TROPONIN I   ____________________________________________   EKG  Interpreted by me Sinus rhythm rate of 78, normal axis. There is excessive noise on the EKG which limits interpretation, no clear ST changes. EKG performed in clinic this morning shows a normal sinus rhythm rate of 78,  normal axis intervals QRS and ST segments and T waves.  Repeat EKG at 1329 interpreted by me  Date: 04/27/2015  Rate: 83  Rhythm: normal sinus rhythm  QRS Axis: normal  Intervals: normal  ST/T Wave abnormalities: normal  Conduction Disutrbances: none  Narrative Interpretation: unremarkable     ____________________________________________    RADIOLOGY  Chest x-ray unremarkable  ____________________________________________   PROCEDURES   ____________________________________________   INITIAL IMPRESSION / ASSESSMENT AND PLAN / ED COURSE  Pertinent labs & imaging results that were available during my care of the patient were reviewed by me and considered in my medical decision making (see chart for details).  Patient presents with recurrent chest pain today which resolved with nitroglycerin. He has many risk factors for CAD including hypertension diabetes hyperlipidemia  CAD CHF and obesity. We'll check labs and plan for observation in the hospital for further cardiac evaluation. Patient has received aspirin already by EMS.     ____________________________________________   FINAL CLINICAL IMPRESSION(S) / ED DIAGNOSES  Final diagnoses:  Chest pain, unspecified chest pain type      Carrie Mew, MD 04/27/15 [place], MD 04/27/15 [phone]

## 2015-04-28 LAB — BASIC METABOLIC PANEL
ANION GAP: 12 (ref 5–15)
BUN: 15 mg/dL (ref 6–20)
CHLORIDE: 95 mmol/L — AB (ref 101–111)
CO2: 30 mmol/L (ref 22–32)
Calcium: 8.9 mg/dL (ref 8.9–10.3)
Creatinine, Ser: 0.97 mg/dL (ref 0.61–1.24)
GFR calc Af Amer: >60 mL/min (ref >60)
GLUCOSE: 178 mg/dL — AB (ref 65–99)
POTASSIUM: 3.4 mmol/L — AB (ref 3.5–5.1)
Sodium: 137 mmol/L (ref 135–145)

## 2015-04-28 LAB — CBC
HEMATOCRIT: 37 % — AB (ref 40.0–52.0)
HEMOGLOBIN: 12 g/dL — AB (ref 13.0–18.0)
MCH: 28.4 pg (ref 26.0–34.0)
MCHC: 32.5 g/dL (ref 32.0–36.0)
MCV: 87.2 fL (ref 80.0–100.0)
Platelets: 187 10*3/uL (ref 150–440)
RBC: 4.24 MIL/uL — ABNORMAL LOW (ref 4.40–5.90)
RDW: 16.7 % — AB (ref 11.5–14.5)
WBC: 8.7 10*3/uL (ref 3.8–10.6)

## 2015-04-28 LAB — TROPONIN I

## 2015-04-28 LAB — GLUCOSE, CAPILLARY: Glucose-Capillary: 162 mg/dL — ABNORMAL HIGH (ref 65–99)

## 2015-04-28 MED ORDER — CLINDAMYCIN HCL 300 MG PO CAPS: 300.0000 mg | ORAL_CAPSULE | Freq: Three times a day (TID) | ORAL | Status: AC

## 2015-04-28 MED ORDER — POTASSIUM CHLORIDE 20 MEQ PO PACK
20.0000 meq | PACK | Freq: Once | ORAL | Status: AC
  Administered 2015-04-28: 20 meq via ORAL
  Filled 2015-04-28: qty 1

## 2015-04-28 NOTE — Care Management Note (Signed)
Case Management Note  Patient Details  Name: Mario Blackburn MRN: [id number] Date of Birth: 11-Nov-1945  Subjective/Objective:       No home health orders. [facility] advised of this less than 24 hour admission to an Observation bed.              Action/Plan:   Expected Discharge Date:                  Expected Discharge Plan:     In-House Referral:     Discharge planning Services     Post Acute Care Choice:    Choice offered to:     DME Arranged:    DME Agency:     HH Arranged:    [place] Agency:     Status of Service:     Medicare Important Message Given:    Date Medicare IM Given:    Medicare IM give by:    Date Additional Medicare IM Given:    Additional Medicare Important Message give by:     If discussed at Major of Stay Meetings, dates discussed:    Additional Comments:  Vaness Jelinski A, RN 04/28/2015, 10:07 AM

## 2015-04-28 NOTE — Discharge Instructions (Signed)
Activity as tolerated , no strenuous work until op cardio eval Keep lower extremities elevated by 5-6 pillows Continue wound care with  [facility]  dressings at [facility] program Diet-diabetic and healthy heart Outpatient follow-up with primary care physician at [facility] program on Monday 9/12 Outpatient follow-up with cardiology Dr. Rockey Situ OR [facility] cardiology on 04/30/2015 Monday-for outpatient stress test in 4-5 days

## 2015-04-28 NOTE — Progress Notes (Signed)
Pt in NAD, skin warm and dry, denies any pain or discomfort at this time.  VSS, SR per monitor.  IV and telemetry discontinued per policy and procedure.  Discharge instructions and prescription given to and reviewed with pt, verbalized understanding.

## 2015-04-28 NOTE — Discharge Summary (Signed)
[place] [facility] [place] NAME: Mario Blackburn    MR#:  [id number]  DATE OF BIRTH:  Jun 06, 1946  DATE OF ADMISSION:  04/27/2015 ADMITTING PHYSICIAN: Theodoro Grist, MD  DATE OF DISCHARGE: 04/28/2015  PRIMARY CARE PHYSICIAN: Pcp Not In System    ADMISSION DIAGNOSIS:  Chest pain, unspecified chest pain type [R07.9]  DISCHARGE DIAGNOSIS:  Chest pain - ruled out AMI  SECONDARY DIAGNOSIS:   Past Medical History  Diagnosis Date  . Arthritis   . Asthma   . CHF (congestive heart failure)   . Hypertension   . Stroke   . Diabetes mellitus without complication   . Osteoarthritis   . Cancer   . Hyperlipemia   . Gout     HOSPITAL COURSE:  Principal Problem:  Chest pain 1. Recurrent chest pain. Pt was monitored on tele, no abnml rhythm identified.  Pt is chest pain free today continue metoprolol, nitroglycerin  and aspirin. Ruled out AMI with 3 negative sets of cardiac enzymes.  D/w [facility] cardiology, he has recommended Myoview stress test as an OP, will be arranged by them 2. Hypertension. Continue metoprolol as well as nitroglycerin  3. History of congestive heart failure seemed to be stable. No exacerbation 4. Diabetes mellitus without complications. Continue patient on his current medications as well as sliding scale insulin and diabetic diet 5 Right lower extremity cellulitis, start Clindamycin po  DISCHARGE CONDITIONS:   satisfactory  CONSULTS OBTAINED:   cardiology- [facility]   PROCEDURES NONE  DRUG ALLERGIES:   Allergies  Allergen Reactions  . Morphine And Related Other (See Comments)    Pt states that this medication put him in a coma.  . Percocet [Oxycodone-Acetaminophen] Other (See Comments)    Pt states that this medication put him in a coma.  . Bactrim [Sulfamethoxazole-Trimethoprim] Other (See Comments)    Reaction:  Unknown   . Benadryl [Diphenhydramine Hcl] Hives  . Keflex [Cephalexin] Hives  . Penicillins  Hives    DISCHARGE MEDICATIONS:   Current Discharge Medication List    START taking these medications   Details  clindamycin (CLEOCIN) 300 MG capsule Take 1 capsule (300 mg total) by mouth every 8 (eight) hours. Qty: 21 capsule, Refills: 0      CONTINUE these medications which have NOT CHANGED   Details  acetaminophen (TYLENOL) 650 MG CR tablet Take 1,300 mg by mouth 2 (two) times daily.    albuterol (PROVENTIL HFA;VENTOLIN HFA) 108 (90 BASE) MCG/ACT inhaler Inhale 2 puffs into the lungs every 4 (four) hours as needed for wheezing or shortness of breath.    albuterol (PROVENTIL) (2.5 MG/3ML) 0.083% nebulizer solution Take 2.5 mg by nebulization 4 (four) times daily as needed for wheezing or shortness of breath.    allopurinol (ZYLOPRIM) 100 MG tablet Take 100 mg by mouth daily.    amLODipine-atorvastatin (CADUET) 10-80 MG per tablet Take 1 tablet by mouth at bedtime.    ARIPiprazole (ABILIFY) 10 MG tablet Take 10 mg by mouth daily.    aspirin EC 81 MG tablet Take 81 mg by mouth daily.    bimatoprost (LUMIGAN) 0.01 % SOLN Place 1 drop into both eyes at bedtime.    brimonidine-timolol (COMBIGAN) 0.2-0.5 % ophthalmic solution Place 1 drop into both eyes 2 (two) times daily.    cadexomer iodine (IODOSORB) 0.9 % gel Apply 1 application topically daily as needed for wound care.    divalproex (DEPAKOTE) 250 MG DR tablet Take 750 mg by mouth  2 (two) times daily.    fluticasone-salmeterol (ADVAIR HFA) 230-21 MCG/ACT inhaler Inhale 1 puff into the lungs 2 (two) times daily.    furosemide (LASIX) 40 MG tablet Take 40 mg by mouth.    gabapentin (NEURONTIN) 300 MG capsule Take 300 mg by mouth at bedtime.    guaifenesin (ROBITUSSIN) 100 MG/5ML syrup Take 100-200 mg by mouth every 4 (four) hours as needed for cough.    insulin aspart (NOVOLOG) 100 UNIT/ML injection Inject 10 Units into the skin 3 (three) times daily before meals.    insulin glargine (LANTUS) 100 UNIT/ML injection  Inject 35 Units into the skin at bedtime.    ketoconazole (NIZORAL) 2 % shampoo Apply 1 application topically 3 (three) times a week.    levothyroxine (SYNTHROID, LEVOTHROID) 50 MCG tablet Take 50 mcg by mouth daily before breakfast.    lidocaine (LIDODERM) 5 % Place 0.5 patches onto the skin daily as needed (for pain). Pt applies half a patch to right shoulder and half a patch to lower back.   Remove & Discard patch within 12 hours or as directed by MD    lisinopril (PRINIVIL,ZESTRIL) 40 MG tablet Take 40 mg by mouth daily.    metoprolol succinate (TOPROL-XL) 25 MG 24 hr tablet Take 25 mg by mouth daily.    montelukast (SINGULAIR) 10 MG tablet Take 10 mg by mouth at bedtime.    nitroGLYCERIN (NITROSTAT) 0.4 MG SL tablet Place 0.4 mg under the tongue every 5 (five) minutes as needed for chest pain.    nystatin-triamcinolone (MYCOLOG II) cream Apply 1 application topically 2 (two) times daily.    omeprazole (PRILOSEC) 20 MG capsule Take 20 mg by mouth daily.    ondansetron (ZOFRAN-ODT) 4 MG disintegrating tablet Take 4 mg by mouth every 8 (eight) hours as needed for nausea or vomiting.    potassium chloride (KLOR-CON) 8 MEQ tablet Take 8 mEq by mouth daily.    sodium chloride (OCEAN) 0.65 % SOLN nasal spray Place 2 sprays into both nostrils as needed for congestion.    tiotropium (SPIRIVA) 18 MCG inhalation capsule Place 18 mcg into inhaler and inhale daily.    venlafaxine XR (EFFEXOR-XR) 150 MG 24 hr capsule Take 150 mg by mouth 2 (two) times daily.    Vitamin D, Ergocalciferol, (DRISDOL) 50000 UNITS CAPS capsule Take 50,000 Units by mouth every 30 (thirty) days.    Zinc Oxide (DESITIN) 13 % CREA Apply 1 application topically daily as needed (for irritation).         DISCHARGE INSTRUCTIONS:   Activity as tolerated , no strenuous work until op cardio eval Keep lower extremities elevated by 5-6 pillows Continue wound care with  [facility]  dressings at pace program Diet-diabetic  and healthy heart Outpatient follow-up with primary care physician at [facility] program on Monday 9/12 Outpatient follow-up with cardiology Dr. Rockey Situ OR [facility] cardiology on 04/30/2015 Monday-for outpatient stress test in 4-5 days  DIET:  Diet-diabetic and healthy heart  DISCHARGE CONDITION:  SATISFACTORY  ACTIVITY:  As tolerated   OXYGEN:  Home Oxygen: NO   Oxygen Delivery: no  DISCHARGE LOCATION:  home  If you experience worsening of your admission symptoms, develop shortness of breath, life threatening emergency, suicidal or homicidal thoughts you must seek medical attention immediately by calling 911 or calling your MD immediately  if symptoms less severe.  You Must read complete instructions/literature along with all the possible adverse reactions/side effects for all the Medicines you take and that have been prescribed  to you. Take any new Medicines after you have completely understood and accpet all the possible adverse reactions/side effects.   Please note  You were cared for by a hospitalist during your hospital stay. If you have any questions about your discharge medications or the care you received while you were in the hospital after you are discharged, you can call the unit and asked to speak with the hospitalist on call if the hospitalist that took care of you is not available. Once you are discharged, your primary care physician will handle any further medical issues. Please note that NO REFILLS for any discharge medications will be authorized once you are discharged, as it is imperative that you return to your primary care physician (or establish a relationship with a primary care physician if you do not have one) for your aftercare needs so that they can reassess your need for medications and monitor your lab values.     Today  Chief Complaint  Patient presents with  . Chest Pain   Pt is resting comfortably, no chest pain or sob  ROS:  CONSTITUTIONAL: Denies fevers,  chills. Denies any fatigue, weakness.  EYES: Denies blurry vision, double vision, eye pain. EARS, NOSE, THROAT: Denies tinnitus, ear pain, hearing loss. RESPIRATORY: Denies cough, wheeze, shortness of breath.  CARDIOVASCULAR: Denies chest pain, palpitations, edema.  GASTROINTESTINAL: Denies nausea, vomiting, diarrhea, abdominal pain. Denies bright red blood per rectum. GENITOURINARY: Denies dysuria, hematuria. ENDOCRINE: Denies nocturia or thyroid problems. HEMATOLOGIC AND LYMPHATIC: Denies easy bruising or bleeding. SKIN: Denies  lesion. LE with decreased redness and swelling MUSCULOSKELETAL: Denies pain in neck, back, shoulder, knees, hips or arthritic symptoms.  NEUROLOGIC: Denies paralysis, paresthesias.  PSYCHIATRIC: Denies anxiety or depressive symptoms.   VITAL SIGNS:  Blood pressure 127/62, pulse 67, temperature 97.5 F (36.4 C), temperature source Oral, resp. rate 18, height 5\' 9"  (1.753 m), weight 162.705 kg (358 lb 11.2 oz), SpO2 91 %.  I/O:   Intake/Output Summary (Last 24 hours) at 04/28/15 0928 Last data filed at 04/28/15 0852  Gross per 24 hour  Intake      0 ml  Output   1500 ml  Net  -1500 ml    PHYSICAL EXAMINATION:  GENERAL:  69 y.o.-year-old patient lying in the bed with no acute distress.  EYES: Pupils equal, round, reactive to light and accommodation. No scleral icterus. Extraocular muscles intact.  HEENT: Head atraumatic, normocephalic. Oropharynx and nasopharynx clear.  NECK:  Supple, no jugular venous distention. No thyroid enlargement, no tenderness.  LUNGS: Normal breath sounds bilaterally, no wheezing, rales,rhonchi or crepitation. No use of accessory muscles of respiration.  CARDIOVASCULAR: S1, S2 normal. No murmurs, rubs, or gallops.  ABDOMEN: Soft, non-tender, non-distended. Bowel sounds present. No organomegaly or mass.  EXTREMITIES: No pedal edema, cyanosis, or clubbing.  NEUROLOGIC: Cranial nerves II through XII are intact. Muscle strength 5/5  in all extremities. Sensation intact. Gait not checked.  PSYCHIATRIC: The patient is alert and oriented x 3.  SKIN: No obvious lesion, or ulcer. LE erythema and edema are improving  DATA REVIEW:   CBC  Recent Labs Lab 04/28/15 0240  WBC 8.7  HGB 12.0*  HCT 37.0*  PLT 187    Chemistries   Recent Labs Lab 04/28/15 0240  NA 137  K 3.4*  CL 95*  CO2 30  GLUCOSE 178*  BUN 15  CREATININE 0.97  CALCIUM 8.9    Cardiac Enzymes  Recent Labs Lab 04/28/15 0240  TROPONINI <0.03  Microbiology Results  Results for orders placed or performed in visit on 01/20/13  Culture, blood (single)     Status: None   Collection Time: 01/19/13 11:08 PM  Result Value Ref Range Status   Micro Text Report   Final       COMMENT                   NO GROWTH AEROBICALLY/ANAEROBICALLY IN 5 DAYS   ANTIBIOTIC                                                      Culture, blood (single)     Status: None   Collection Time: 01/19/13 11:08 PM  Result Value Ref Range Status   Micro Text Report   Final       COMMENT                   NO GROWTH AEROBICALLY/ANAEROBICALLY IN 5 DAYS   ANTIBIOTIC                                                      Urine culture     Status: None   Collection Time: 01/19/13 11:42 PM  Result Value Ref Range Status   Micro Text Report   Final       SOURCE: CLEAN CATCH    COMMENT                   NO GROWTH IN 36 HOURS   ANTIBIOTIC                                                        RADIOLOGY:  Dg Chest Port 1 View  04/27/2015   CLINICAL DATA:  Left-sided chest pain.  EXAM: PORTABLE CHEST - 1 VIEW  COMPARISON:  03/05/2013  FINDINGS: Portable view of the chest demonstrates low lung volumes. Vascular crowding secondary to low lung volumes. No focal airspace disease. Heart size is stable. Trachea is midline.  IMPRESSION: Low lung volumes without focal disease.   Electronically Signed   By: Markus Daft M.D.   On: 04/27/2015 12:09    EKG:   Orders placed or  performed during the hospital encounter of 04/27/15  . ED EKG  . ED EKG  . EKG 12-Lead  . EKG 12-Lead  . ED EKG  . ED EKG      Management plans discussed with the patient, family and they are in agreement.  CODE STATUS:     Code Status Orders        Start     Ordered   04/27/15 1420  Full code   Continuous     04/27/15 1420      TOTAL TIME TAKING CARE OF THIS PATIENT: 45 minutes.    @[facility] @  on 04/28/2015 at 9:28 AM  Between 7am to 6pm - Pager - [phone]  After 6pm go to [email or web address] - [facility]  Office  [phone]  CC: Primary care physician; Pcp Not In System

## 2015-04-30 ENCOUNTER — Telehealth: Payer: Self-pay

## 2015-04-30 NOTE — Telephone Encounter (Signed)
Caretaker states pt already is a patient with [facility]. He is f.u there.

## 2015-04-30 NOTE — Telephone Encounter (Signed)
Attempted to call pt to schedule hosp f/u. No answer

## 2015-08-20 ENCOUNTER — Encounter: Payer: Self-pay | Admitting: Student

## 2015-08-20 ENCOUNTER — Emergency Department: Payer: Medicare (Managed Care)

## 2015-08-20 ENCOUNTER — Observation Stay
Admission: EM | Admit: 2015-08-20 | Discharge: 2015-08-21 | Disposition: A | Discharge status: Home / Self Care | Payer: Medicare (Managed Care) | Attending: Internal Medicine | Admitting: Internal Medicine

## 2015-08-20 DIAGNOSIS — I5032 Chronic diastolic (congestive) heart failure: Secondary | ICD-10-CM | POA: Insufficient documentation

## 2015-08-20 DIAGNOSIS — Z8673 Personal history of transient ischemic attack (TIA), and cerebral infarction without residual deficits: Secondary | ICD-10-CM | POA: Insufficient documentation

## 2015-08-20 DIAGNOSIS — Z7982 Long term (current) use of aspirin: Secondary | ICD-10-CM | POA: Insufficient documentation

## 2015-08-20 DIAGNOSIS — Z880 Allergy status to penicillin: Secondary | ICD-10-CM | POA: Insufficient documentation

## 2015-08-20 DIAGNOSIS — Z882 Allergy status to sulfonamides status: Secondary | ICD-10-CM | POA: Insufficient documentation

## 2015-08-20 DIAGNOSIS — M109 Gout, unspecified: Secondary | ICD-10-CM | POA: Diagnosis not present

## 2015-08-20 DIAGNOSIS — R079 Chest pain, unspecified: Secondary | ICD-10-CM | POA: Insufficient documentation

## 2015-08-20 DIAGNOSIS — Z9981 Dependence on supplemental oxygen: Secondary | ICD-10-CM | POA: Insufficient documentation

## 2015-08-20 DIAGNOSIS — Z87891 Personal history of nicotine dependence: Secondary | ICD-10-CM | POA: Insufficient documentation

## 2015-08-20 DIAGNOSIS — Z79899 Other long term (current) drug therapy: Secondary | ICD-10-CM | POA: Insufficient documentation

## 2015-08-20 DIAGNOSIS — J441 Chronic obstructive pulmonary disease with (acute) exacerbation: Secondary | ICD-10-CM | POA: Insufficient documentation

## 2015-08-20 DIAGNOSIS — Z794 Long term (current) use of insulin: Secondary | ICD-10-CM | POA: Insufficient documentation

## 2015-08-20 DIAGNOSIS — G4730 Sleep apnea, unspecified: Secondary | ICD-10-CM | POA: Insufficient documentation

## 2015-08-20 DIAGNOSIS — E119 Type 2 diabetes mellitus without complications: Secondary | ICD-10-CM | POA: Insufficient documentation

## 2015-08-20 DIAGNOSIS — Z7951 Long term (current) use of inhaled steroids: Secondary | ICD-10-CM | POA: Insufficient documentation

## 2015-08-20 DIAGNOSIS — E039 Hypothyroidism, unspecified: Secondary | ICD-10-CM | POA: Insufficient documentation

## 2015-08-20 DIAGNOSIS — M1990 Unspecified osteoarthritis, unspecified site: Secondary | ICD-10-CM | POA: Diagnosis not present

## 2015-08-20 DIAGNOSIS — J9601 Acute respiratory failure with hypoxia: Secondary | ICD-10-CM | POA: Diagnosis not present

## 2015-08-20 DIAGNOSIS — R0902 Hypoxemia: Secondary | ICD-10-CM | POA: Diagnosis present

## 2015-08-20 DIAGNOSIS — J45901 Unspecified asthma with (acute) exacerbation: Secondary | ICD-10-CM | POA: Insufficient documentation

## 2015-08-20 DIAGNOSIS — I10 Essential (primary) hypertension: Secondary | ICD-10-CM | POA: Insufficient documentation

## 2015-08-20 DIAGNOSIS — Z888 Allergy status to other drugs, medicaments and biological substances status: Secondary | ICD-10-CM | POA: Diagnosis not present

## 2015-08-20 DIAGNOSIS — R05 Cough: Secondary | ICD-10-CM | POA: Diagnosis not present

## 2015-08-20 DIAGNOSIS — E785 Hyperlipidemia, unspecified: Secondary | ICD-10-CM | POA: Insufficient documentation

## 2015-08-20 LAB — COMPREHENSIVE METABOLIC PANEL
ALBUMIN: 3.2 g/dL — AB (ref 3.5–5.0)
ALK PHOS: 72 U/L (ref 38–126)
ALT: 21 U/L (ref 17–63)
ANION GAP: 6 (ref 5–15)
AST: 26 U/L (ref 15–41)
BILIRUBIN TOTAL: 0.3 mg/dL (ref 0.3–1.2)
BUN: 18 mg/dL (ref 6–20)
CO2: 32 mmol/L (ref 22–32)
Calcium: 8.6 mg/dL — ABNORMAL LOW (ref 8.9–10.3)
Chloride: 103 mmol/L (ref 101–111)
Creatinine, Ser: 0.78 mg/dL (ref 0.61–1.24)
GFR calc Af Amer: >60 mL/min (ref >60)
GFR calc non Af Amer: >60 mL/min (ref >60)
GLUCOSE: 142 mg/dL — AB (ref 65–99)
POTASSIUM: 3.9 mmol/L (ref 3.5–5.1)
SODIUM: 141 mmol/L (ref 135–145)
Total Protein: 7.3 g/dL (ref 6.5–8.1)

## 2015-08-20 LAB — CBC WITH DIFFERENTIAL/PLATELET
BASOS ABS: 0 10*3/uL (ref 0–0.1)
BASOS PCT: 0 %
EOS ABS: 0.4 10*3/uL (ref 0–0.7)
Eosinophils Relative: 5 %
HEMATOCRIT: 36.4 % — AB (ref 40.0–52.0)
HEMOGLOBIN: 12.1 g/dL — AB (ref 13.0–18.0)
Lymphocytes Relative: 36 %
Lymphs Abs: 2.8 10*3/uL (ref 1.0–3.6)
MCH: 28.4 pg (ref 26.0–34.0)
MCHC: 33.1 g/dL (ref 32.0–36.0)
MCV: 85.8 fL (ref 80.0–100.0)
MONOS PCT: 13 %
Monocytes Absolute: 1 10*3/uL (ref 0.2–1.0)
NEUTROS ABS: 3.7 10*3/uL (ref 1.4–6.5)
NEUTROS PCT: 46 %
Platelets: 221 10*3/uL (ref 150–440)
RBC: 4.25 MIL/uL — ABNORMAL LOW (ref 4.40–5.90)
RDW: 17 % — ABNORMAL HIGH (ref 11.5–14.5)
WBC: 7.9 10*3/uL (ref 3.8–10.6)

## 2015-08-20 LAB — BRAIN NATRIURETIC PEPTIDE: B Natriuretic Peptide: 18 pg/mL (ref 0.0–100.0)

## 2015-08-20 LAB — GLUCOSE, CAPILLARY
Glucose-Capillary: 198 mg/dL — ABNORMAL HIGH (ref 65–99)
Glucose-Capillary: 305 mg/dL — ABNORMAL HIGH (ref 65–99)

## 2015-08-20 LAB — LIPASE, BLOOD: Lipase: 15 U/L (ref 11–51)

## 2015-08-20 LAB — TROPONIN I: Troponin I: <0.03 ng/mL (ref <0.031)

## 2015-08-20 MED ORDER — SODIUM CHLORIDE 0.9 % IJ SOLN: 3.0000 mL | INTRAMUSCULAR | Status: DC | PRN

## 2015-08-20 MED ORDER — GUAIFENESIN 100 MG/5ML PO SYRP
100.0000 mg | ORAL_SOLUTION | ORAL | Status: DC | PRN
  Filled 2015-08-20: qty 10

## 2015-08-20 MED ORDER — TIMOLOL MALEATE 0.5 % OP SOLN
1.0000 [drp] | Freq: Two times a day (BID) | OPHTHALMIC | Status: DC
  Administered 2015-08-20 – 2015-08-21 (×2): 1 [drp] via OPHTHALMIC
  Filled 2015-08-20: qty 5

## 2015-08-20 MED ORDER — SODIUM CHLORIDE 0.9 % IJ SOLN
3.0000 mL | Freq: Two times a day (BID) | INTRAMUSCULAR | Status: DC
  Administered 2015-08-20 – 2015-08-21 (×2): 3 mL via INTRAVENOUS

## 2015-08-20 MED ORDER — ALLOPURINOL 100 MG PO TABS
100.0000 mg | ORAL_TABLET | Freq: Every day | ORAL | Status: DC
  Administered 2015-08-20 – 2015-08-21 (×2): 100 mg via ORAL
  Filled 2015-08-20 (×2): qty 1

## 2015-08-20 MED ORDER — ALBUTEROL SULFATE (2.5 MG/3ML) 0.083% IN NEBU
2.5000 mg | INHALATION_SOLUTION | RESPIRATORY_TRACT | Status: AC
  Administered 2015-08-20: 2.5 mg via RESPIRATORY_TRACT
  Filled 2015-08-20: qty 3

## 2015-08-20 MED ORDER — TIOTROPIUM BROMIDE MONOHYDRATE 18 MCG IN CAPS
18.0000 ug | ORAL_CAPSULE | Freq: Every day | RESPIRATORY_TRACT | Status: DC
Start: 2015-08-20 — End: 2015-08-21
  Administered 2015-08-20 – 2015-08-21 (×2): 18 ug via RESPIRATORY_TRACT
  Filled 2015-08-20: qty 5

## 2015-08-20 MED ORDER — LISINOPRIL 20 MG PO TABS
40.0000 mg | ORAL_TABLET | Freq: Every day | ORAL | Status: DC
  Administered 2015-08-20: 18:00:00 40 mg via ORAL
  Filled 2015-08-20 (×2): qty 2

## 2015-08-20 MED ORDER — GABAPENTIN 300 MG PO CAPS
300.0000 mg | ORAL_CAPSULE | Freq: Every day | ORAL | Status: DC
  Administered 2015-08-20: 300 mg via ORAL
  Filled 2015-08-20: qty 1

## 2015-08-20 MED ORDER — IPRATROPIUM-ALBUTEROL 0.5-2.5 (3) MG/3ML IN SOLN
3.0000 mL | Freq: Once | RESPIRATORY_TRACT | Status: AC
  Administered 2015-08-20: 3 mL via RESPIRATORY_TRACT
  Filled 2015-08-20: qty 3

## 2015-08-20 MED ORDER — BRIMONIDINE TARTRATE 0.2 % OP SOLN
1.0000 [drp] | Freq: Two times a day (BID) | OPHTHALMIC | Status: DC
  Administered 2015-08-20 – 2015-08-21 (×2): 1 [drp] via OPHTHALMIC
  Filled 2015-08-20: qty 5

## 2015-08-20 MED ORDER — METOPROLOL SUCCINATE ER 25 MG PO TB24
25.0000 mg | ORAL_TABLET | Freq: Every day | ORAL | Status: DC
  Administered 2015-08-20: 25 mg via ORAL
  Filled 2015-08-20 (×2): qty 1

## 2015-08-20 MED ORDER — INSULIN GLARGINE 100 UNIT/ML ~~LOC~~ SOLN
35.0000 [IU] | Freq: Every day | SUBCUTANEOUS | Status: DC
Start: 2015-08-20 — End: 2015-08-21
  Administered 2015-08-20: 35 [IU] via SUBCUTANEOUS
  Filled 2015-08-20 (×3): qty 0.35

## 2015-08-20 MED ORDER — FUROSEMIDE 40 MG PO TABS
40.0000 mg | ORAL_TABLET | Freq: Every day | ORAL | Status: DC
  Administered 2015-08-20 – 2015-08-21 (×2): 40 mg via ORAL
  Filled 2015-08-20 (×2): qty 1

## 2015-08-20 MED ORDER — PREDNISONE 50 MG PO TABS
50.0000 mg | ORAL_TABLET | Freq: Every day | ORAL | Status: DC
  Administered 2015-08-21: 50 mg via ORAL
  Filled 2015-08-20: qty 1

## 2015-08-20 MED ORDER — LATANOPROST 0.005 % OP SOLN
1.0000 [drp] | Freq: Every day | OPHTHALMIC | Status: DC
  Administered 2015-08-20: 22:00:00 1 [drp] via OPHTHALMIC
  Filled 2015-08-20: qty 2.5

## 2015-08-20 MED ORDER — METHYLPREDNISOLONE SODIUM SUCC 125 MG IJ SOLR
125.0000 mg | INTRAMUSCULAR | Status: AC
  Administered 2015-08-20: 125 mg via INTRAVENOUS
  Filled 2015-08-20: qty 2

## 2015-08-20 MED ORDER — VITAMIN D (ERGOCALCIFEROL) 1.25 MG (50000 UNIT) PO CAPS
50000.0000 [IU] | ORAL_CAPSULE | ORAL | Status: DC
  Administered 2015-08-20: 18:00:00 50000 [IU] via ORAL
  Filled 2015-08-20: qty 1

## 2015-08-20 MED ORDER — ASPIRIN EC 81 MG PO TBEC
81.0000 mg | DELAYED_RELEASE_TABLET | Freq: Every day | ORAL | Status: DC
  Administered 2015-08-20 – 2015-08-21 (×2): 81 mg via ORAL
  Filled 2015-08-20 (×3): qty 1

## 2015-08-20 MED ORDER — DIVALPROEX SODIUM 250 MG PO DR TAB
750.0000 mg | DELAYED_RELEASE_TABLET | Freq: Two times a day (BID) | ORAL | Status: DC
  Administered 2015-08-20 – 2015-08-21 (×2): 750 mg via ORAL
  Filled 2015-08-20 (×2): qty 3

## 2015-08-20 MED ORDER — MOMETASONE FURO-FORMOTEROL FUM 200-5 MCG/ACT IN AERO
2.0000 | INHALATION_SPRAY | Freq: Two times a day (BID) | RESPIRATORY_TRACT | Status: DC
  Administered 2015-08-20 – 2015-08-21 (×2): 2 via RESPIRATORY_TRACT
  Filled 2015-08-20: qty 8.8

## 2015-08-20 MED ORDER — ALBUTEROL SULFATE (2.5 MG/3ML) 0.083% IN NEBU: 2.5000 mg | INHALATION_SOLUTION | Freq: Four times a day (QID) | RESPIRATORY_TRACT | Status: DC | PRN

## 2015-08-20 MED ORDER — INSULIN ASPART 100 UNIT/ML ~~LOC~~ SOLN
10.0000 [IU] | Freq: Three times a day (TID) | SUBCUTANEOUS | Status: DC
Start: 2015-08-20 — End: 2015-08-21
  Administered 2015-08-20 – 2015-08-21 (×3): 10 [IU] via SUBCUTANEOUS
  Filled 2015-08-20 (×2): qty 10

## 2015-08-20 MED ORDER — PANTOPRAZOLE SODIUM 40 MG PO TBEC
40.0000 mg | DELAYED_RELEASE_TABLET | Freq: Every day | ORAL | Status: DC
  Administered 2015-08-20 – 2015-08-21 (×2): 40 mg via ORAL
  Filled 2015-08-20 (×3): qty 1

## 2015-08-20 MED ORDER — ONDANSETRON HCL 4 MG PO TABS: 4.0000 mg | ORAL_TABLET | Freq: Four times a day (QID) | ORAL | Status: DC | PRN

## 2015-08-20 MED ORDER — ARIPIPRAZOLE 10 MG PO TABS
10.0000 mg | ORAL_TABLET | Freq: Every day | ORAL | Status: DC
  Administered 2015-08-20 – 2015-08-21 (×2): 10 mg via ORAL
  Filled 2015-08-20 (×2): qty 1

## 2015-08-20 MED ORDER — ONDANSETRON 4 MG PO TBDP: 4.0000 mg | ORAL_TABLET | Freq: Three times a day (TID) | ORAL | Status: DC | PRN

## 2015-08-20 MED ORDER — ONDANSETRON HCL 4 MG/2ML IJ SOLN: 4.0000 mg | Freq: Four times a day (QID) | INTRAMUSCULAR | Status: DC | PRN

## 2015-08-20 MED ORDER — BRIMONIDINE TARTRATE-TIMOLOL 0.2-0.5 % OP SOLN: 1.0000 [drp] | Freq: Two times a day (BID) | OPHTHALMIC | Status: DC

## 2015-08-20 MED ORDER — SODIUM CHLORIDE 0.9 % IV SOLN: 250.0000 mL | INTRAVENOUS | Status: DC | PRN

## 2015-08-20 MED ORDER — AMLODIPINE BESYLATE 10 MG PO TABS
10.0000 mg | ORAL_TABLET | Freq: Every day | ORAL | Status: DC
  Administered 2015-08-20: 10 mg via ORAL
  Filled 2015-08-20 (×2): qty 1

## 2015-08-20 MED ORDER — ALBUTEROL SULFATE HFA 108 (90 BASE) MCG/ACT IN AERS: 2.0000 | INHALATION_SPRAY | RESPIRATORY_TRACT | Status: DC | PRN

## 2015-08-20 MED ORDER — AMLODIPINE-ATORVASTATIN 10-80 MG PO TABS: 1.0000 | ORAL_TABLET | Freq: Every day | ORAL | Status: DC

## 2015-08-20 MED ORDER — ATORVASTATIN CALCIUM 20 MG PO TABS
80.0000 mg | ORAL_TABLET | Freq: Every day | ORAL | Status: DC
  Administered 2015-08-20: 18:00:00 80 mg via ORAL
  Filled 2015-08-20: qty 4

## 2015-08-20 MED ORDER — ENOXAPARIN SODIUM 40 MG/0.4ML ~~LOC~~ SOLN
40.0000 mg | Freq: Two times a day (BID) | SUBCUTANEOUS | Status: DC
  Administered 2015-08-20 – 2015-08-21 (×2): 40 mg via SUBCUTANEOUS
  Filled 2015-08-20 (×2): qty 0.4

## 2015-08-20 MED ORDER — MONTELUKAST SODIUM 10 MG PO TABS
10.0000 mg | ORAL_TABLET | Freq: Every day | ORAL | Status: DC
  Administered 2015-08-20: 22:00:00 10 mg via ORAL
  Filled 2015-08-20: qty 1

## 2015-08-20 MED ORDER — LEVOTHYROXINE SODIUM 50 MCG PO TABS
50.0000 ug | ORAL_TABLET | Freq: Every day | ORAL | Status: DC
  Administered 2015-08-21: 09:00:00 50 ug via ORAL
  Filled 2015-08-20: qty 1

## 2015-08-20 MED ORDER — POTASSIUM CHLORIDE ER 8 MEQ PO TBCR
8.0000 meq | EXTENDED_RELEASE_TABLET | Freq: Every day | ORAL | Status: DC
  Administered 2015-08-20 – 2015-08-21 (×2): 8 meq via ORAL
  Filled 2015-08-20 (×2): qty 1

## 2015-08-20 MED ORDER — VENLAFAXINE HCL ER 75 MG PO CP24
150.0000 mg | ORAL_CAPSULE | Freq: Two times a day (BID) | ORAL | Status: DC
  Administered 2015-08-20 – 2015-08-21 (×2): 150 mg via ORAL
  Filled 2015-08-20 (×2): qty 2

## 2015-08-20 MED ORDER — ACETAMINOPHEN 325 MG PO TABS: 325.0000 mg | ORAL_TABLET | Freq: Four times a day (QID) | ORAL | Status: DC | PRN

## 2015-08-20 NOTE — ED Provider Notes (Signed)
[facility] Emergency Department Provider Note  REMINDER - THIS NOTE IS NOT A FINAL MEDICAL RECORD UNTIL IT IS SIGNED. UNTIL THEN, THE CONTENT BELOW MAY REFLECT INFORMATION FROM A DOCUMENTATION TEMPLATE, NOT THE ACTUAL PATIENT VISIT. ____________________________________________  Time seen: Approximately 11:37 AM  I have reviewed the triage vital signs and the nursing notes.   HISTORY  Chief Complaint URI    HPI Mario Blackburn is a 70 y.o. male extensive medical history including congestive heart failure, asthma/COPD, diabetes, obesity.  Patient reports that he's been having a cough and runny nose and congestion for about the last week. He reports that today he is being so congested that he feels short of breath, having trouble breathing, and also reports that he seems like he is putting on weight and swelling in both legs. He does not have any pain. He denies using oxygen at home. Do not use any of his nebulizers before arrival.  He reports productive cough and chills.   Past Medical History  Diagnosis Date  . Arthritis   . Asthma   . CHF (congestive heart failure)   . Hypertension   . Stroke   . Diabetes mellitus without complication   . Osteoarthritis   . Cancer   . Hyperlipemia   . Gout     Patient Active Problem List   Diagnosis Date Noted  . Chest pain 04/27/2015    Past Surgical History  Procedure Laterality Date  . Cholecystectomy      Current Outpatient Rx  Name  Route  Sig  Dispense  Refill  . acetaminophen (TYLENOL) 650 MG CR tablet   Oral   Take 1,300 mg by mouth 2 (two) times daily.         Marland Kitchen albuterol (PROVENTIL HFA;VENTOLIN HFA) 108 (90 BASE) MCG/ACT inhaler   Inhalation   Inhale 2 puffs into the lungs every 4 (four) hours as needed for wheezing or shortness of breath.         Marland Kitchen albuterol (PROVENTIL) (2.5 MG/3ML) 0.083% nebulizer solution   Nebulization   Take 2.5 mg by nebulization 4 (four) times daily as needed  for wheezing or shortness of breath.         . allopurinol (ZYLOPRIM) 100 MG tablet   Oral   Take 100 mg by mouth daily.         Marland Kitchen amLODipine-atorvastatin (CADUET) 10-80 MG per tablet   Oral   Take 1 tablet by mouth at bedtime.         . ARIPiprazole (ABILIFY) 10 MG tablet   Oral   Take 10 mg by mouth daily.         Marland Kitchen aspirin EC 81 MG tablet   Oral   Take 81 mg by mouth daily.         . bimatoprost (LUMIGAN) 0.01 % SOLN   Both Eyes   Place 1 drop into both eyes at bedtime.         . brimonidine-timolol (COMBIGAN) 0.2-0.5 % ophthalmic solution   Both Eyes   Place 1 drop into both eyes 2 (two) times daily.         . cadexomer iodine (IODOSORB) 0.9 % gel   Topical   Apply 1 application topically daily as needed for wound care.         . divalproex (DEPAKOTE) 250 MG DR tablet   Oral   Take 750 mg by mouth 2 (two) times daily.         Marland Kitchen  fluticasone-salmeterol (ADVAIR HFA) 230-21 MCG/ACT inhaler   Inhalation   Inhale 1 puff into the lungs 2 (two) times daily.         . furosemide (LASIX) 40 MG tablet   Oral   Take 40 mg by mouth.         . gabapentin (NEURONTIN) 300 MG capsule   Oral   Take 300 mg by mouth at bedtime.         Marland Kitchen guaifenesin (ROBITUSSIN) 100 MG/5ML syrup   Oral   Take 100-200 mg by mouth every 4 (four) hours as needed for cough.         . insulin aspart (NOVOLOG) 100 UNIT/ML injection   Subcutaneous   Inject 10 Units into the skin 3 (three) times daily before meals.         . insulin glargine (LANTUS) 100 UNIT/ML injection   Subcutaneous   Inject 35 Units into the skin at bedtime.         Marland Kitchen ketoconazole (NIZORAL) 2 % shampoo   Topical   Apply 1 application topically 3 (three) times a week.         . levothyroxine (SYNTHROID, LEVOTHROID) 50 MCG tablet   Oral   Take 50 mcg by mouth daily before breakfast.         . lidocaine (LIDODERM) 5 %   Transdermal   Place 0.5 patches onto the skin daily as needed (for  pain). Pt applies half a patch to right shoulder and half a patch to lower back.   Remove & Discard patch within 12 hours or as directed by MD         . lisinopril (PRINIVIL,ZESTRIL) 40 MG tablet   Oral   Take 40 mg by mouth daily.         . metoprolol succinate (TOPROL-XL) 25 MG 24 hr tablet   Oral   Take 25 mg by mouth daily.         . montelukast (SINGULAIR) 10 MG tablet   Oral   Take 10 mg by mouth at bedtime.         . nitroGLYCERIN (NITROSTAT) 0.4 MG SL tablet   Sublingual   Place 0.4 mg under the tongue every 5 (five) minutes as needed for chest pain.         Marland Kitchen nystatin-triamcinolone (MYCOLOG II) cream   Topical   Apply 1 application topically 2 (two) times daily.         Marland Kitchen omeprazole (PRILOSEC) 20 MG capsule   Oral   Take 20 mg by mouth daily.         . ondansetron (ZOFRAN-ODT) 4 MG disintegrating tablet   Oral   Take 4 mg by mouth every 8 (eight) hours as needed for nausea or vomiting.         . potassium chloride (KLOR-CON) 8 MEQ tablet   Oral   Take 8 mEq by mouth daily.         . sodium chloride (OCEAN) 0.65 % SOLN nasal spray   Each Nare   Place 2 sprays into both nostrils as needed for congestion.         Marland Kitchen tiotropium (SPIRIVA) 18 MCG inhalation capsule   Inhalation   Place 18 mcg into inhaler and inhale daily.         Marland Kitchen venlafaxine XR (EFFEXOR-XR) 150 MG 24 hr capsule   Oral   Take 150 mg by mouth 2 (two) times daily.         Marland Kitchen  Vitamin D, Ergocalciferol, (DRISDOL) 50000 UNITS CAPS capsule   Oral   Take 50,000 Units by mouth every 30 (thirty) days.         . Zinc Oxide (DESITIN) 13 % CREA   Apply externally   Apply 1 application topically daily as needed (for irritation).           Allergies Morphine and related; Percocet; Bactrim; Benadryl; Keflex; and Penicillins  Family History  Problem Relation Age of Onset  . Family history unknown: Yes    Social History Social History  Substance Use Topics  . Smoking  status: Former Research scientist (life sciences)  . Smokeless tobacco: Not on file  . Alcohol Use: No    Review of Systems Constitutional: See history of present illness, no fever that he is aware of Eyes: No visual changes. ENT: No sore throat. Cardiovascular: Denies chest pain. Respiratory: See history of present illness Gastrointestinal: No abdominal pain.  No nausea, no vomiting.  No diarrhea.  No constipation. Genitourinary: Negative for dysuria. Musculoskeletal: Negative for back pain. Skin: Negative for rash. Neurological: Negative for headaches, focal weakness or numbness.  10-point ROS otherwise negative.  ____________________________________________   PHYSICAL EXAM:  VITAL SIGNS: ED Triage Vitals  Enc Vitals Group     BP 08/20/15 1042 129/62 mmHg     Pulse Rate 08/20/15 1031 78     Resp 08/20/15 1031 26     Temp 08/20/15 1031 97.5 F (36.4 C)     Temp Source 08/20/15 1031 Oral     SpO2 08/20/15 1031 94 %     Weight 08/20/15 1031 368 lb (166.924 kg)     Height --      Head Cir --      Peak Flow --      Pain Score 08/20/15 1032 0     Pain Loc --      Pain Edu? --      Excl. in [place]? --    Constitutional: Alert and oriented. Chronically ill appearing, morbidly obese, sitting up straight in the bed with mild use of accessory muscles. While speaking his oxygen saturation drops to about 86%. This improves after Place him on 2 L nasal cannula. Currently satting in the mid 90s on oxygen. Eyes: Conjunctivae are normal. PERRL. EOMI. Head: Atraumatic. Nose: No congestion/rhinnorhea. Mouth/Throat: Mucous membranes are moist.  Oropharynx non-erythematous. Neck: No stridor.  Difficult to assess for JVD due to a beard and obesity of the neck. Cardiovascular: Normal rate, regular rhythm. Soft and somewhat diminished. Grossly normal heart sounds.  Good peripheral circulation. Respiratory: Mild increased work of breathing, speaks in phrases. Mild use of accessory muscles. End expiratory wheezing heard  primarily in the lower fields, central rhonchi. Frequent and occasionally productive cough with yellow sputum Gastrointestinal: Soft and nontender. No distention. No abdominal bruits. No CVA tenderness. Musculoskeletal: No lower extremity tenderness does have venous stasis and about 3+ edema bilaterally. Neurologic:  Normal speech and language. No gross focal neurologic deficits are appreciated.  Skin:  Skin is warm, dry and intact. No rash noted. Psychiatric: Mood and affect are normal. Speech and behavior are normal.  ____________________________________________   LABS (all labs ordered are listed, but only abnormal results are displayed)  Labs Reviewed  CBC WITH DIFFERENTIAL/PLATELET - Abnormal; Notable for the following:    RBC 4.25 (*)    Hemoglobin 12.1 (*)    HCT 36.4 (*)    RDW 17.0 (*)    All other components within normal limits  COMPREHENSIVE METABOLIC PANEL -  Abnormal; Notable for the following:    Glucose, Bld 142 (*)    Calcium 8.6 (*)    Albumin 3.2 (*)    All other components within normal limits  LIPASE, BLOOD  BRAIN NATRIURETIC PEPTIDE  TROPONIN I   ____________________________________________  EKG  Reviewed and interpreted by me at 10:30 AM Normal sinus rhythm Initial EKG without V2 on the tracing Remaining leads appear normal without evidence of acute ischemia QTc 460 QRS 90 Heart rate 80   ____________________________________________  RADIOLOGY  ED ECG REPORT I, QUALE, MARK, the attending physician, personally viewed and interpreted this ECG.  Date: 08/20/2015 EKG Time: 1340 Rate: 80 Rhythm: normal sinus rhythm QRS Axis: normal Intervals: normal ST/T Wave abnormalities: normal Conduction Disutrbances: none Narrative Interpretation: unremarkable  ____________________________________________   PROCEDURES  Procedure(s) performed: None  Critical Care performed: No  ____________________________________________   INITIAL IMPRESSION /  ASSESSMENT AND PLAN / ED COURSE  Pertinent labs & imaging results that were available during my care of the patient were reviewed by me and considered in my medical decision making (see chart for details).  Patient presents for evaluation of dyspnea. During my evaluation, when the patient is speaking he does desat to 86% with supplemental oxygen turned off. He improved quickly is supple no oxygen and does have evidence of reactive airway disease including mild and expiratory wheezing and dyspnea. He does speak in short phrases. There is a history of asthma, he is also significantly overweight.  Chest x-ray is without clear infiltrate, he does have upper respiratory and symptoms I suspect probably a viral illness exacerbating reactive airway disease. In addition there is no evidence of failure based on x-ray and BNP at this time.  ----------------------------------------- 2:54 PM on 08/20/2015 -----------------------------------------  Patient is showing improvement. Overall, based on his mild oxygen requirement and diagnosis of reactive airway disease will admit the patient hospital for continued management as he has received multiple nebulizers, Solu-Medrol and continues to feel a need for oxygen supplementation. ____________________________________________   FINAL CLINICAL IMPRESSION(S) / ED DIAGNOSES  Final diagnoses:  Asthma exacerbation  Requires supplemental oxygen      Delman Kitten, MD 08/20/15 1455

## 2015-08-20 NOTE — Progress Notes (Signed)
ANTICOAGULATION CONSULT NOTE - Initial Consult  Pharmacy Consult for Lovenox  Indication: VTE prophylaxis  Allergies  Allergen Reactions  . Morphine And Related Other (See Comments)    Pt states that this medication put him in a coma.  . Percocet [Oxycodone-Acetaminophen] Other (See Comments)    Pt states that this medication put him in a coma.  . Bactrim [Sulfamethoxazole-Trimethoprim] Other (See Comments)    Reaction:  Unknown   . Benadryl [Diphenhydramine Hcl] Hives  . Keflex [Cephalexin] Hives  . Penicillins Hives and Other (See Comments)    Has patient had a PCN reaction causing immediate rash, facial/tongue/throat swelling, SOB or lightheadedness with hypotension: No Has patient had a PCN reaction causing severe rash involving mucus membranes or skin necrosis: No Has patient had a PCN reaction that required hospitalization No Has patient had a PCN reaction occurring within the last 10 years: No If all of the above answers are "NO", then may proceed with Cephalosporin use.    Patient Measurements: Weight: (!) 368 lb (166.924 kg) Heparin Dosing Weight:   Vital Signs: Temp: 98 F (36.7 C) (01/02 1700) Temp Source: Oral (01/02 1700) BP: 145/79 mmHg (01/02 1700) Pulse Rate: 96 (01/02 1700)  Labs:  Recent Labs  08/20/15 1301  HGB 12.1*  HCT 36.4*  PLT 221  CREATININE 0.78  TROPONINI <0.03    Estimated Creatinine Clearance: 136.5 mL/min (by C-G formula based on Cr of 0.78).   Medical History: Past Medical History  Diagnosis Date  . Arthritis   . Asthma   . CHF (congestive heart failure) ([place])   . Hypertension   . Stroke ([place])   . Diabetes mellitus without complication ([place])   . Osteoarthritis   . Cancer ([place])   . Hyperlipemia   . Gout     Medications:  Scheduled:  . allopurinol  100 mg Oral Daily  . amLODipine-atorvastatin  1 tablet Oral QHS  . ARIPiprazole  10 mg Oral Daily  . aspirin EC  81 mg Oral Daily  . brimonidine-timolol  1 drop Both Eyes  BID  . divalproex  750 mg Oral BID  . enoxaparin (LOVENOX) injection  40 mg Subcutaneous Q12H  . furosemide  40 mg Oral Daily  . gabapentin  300 mg Oral QHS  . insulin aspart  10 Units Subcutaneous TID AC  . insulin glargine  35 Units Subcutaneous QHS  . latanoprost  1 drop Both Eyes QHS  . [START ON 08/21/2015] levothyroxine  50 mcg Oral QAC breakfast  . lisinopril  40 mg Oral Daily  . metoprolol succinate  25 mg Oral Daily  . mometasone-formoterol  2 puff Inhalation BID  . montelukast  10 mg Oral QHS  . pantoprazole  40 mg Oral Daily  . potassium chloride  8 mEq Oral Daily  . [START ON 08/21/2015] predniSONE  50 mg Oral Q breakfast  . sodium chloride  3 mL Intravenous Q12H  . tiotropium  18 mcg Inhalation Daily  . venlafaxine XR  150 mg Oral BID  . Vitamin D (Ergocalciferol)  50,000 Units Oral Q30 days    Assessment: CrCl = 136.5 ml/min TBW  = 166.9 kg   Goal of Therapy:  DVT prophylaxis    Plan:  Lovenox 40 mg SQ Q24H originally ordered. Will adjust dose to lovenox 40 mg SQ Q12H based on BMI > 40 .   Mario Blackburn D 08/20/2015,5:06 PM

## 2015-08-20 NOTE — Plan of Care (Signed)
Problem: Activity: Goal: Ability to implement measures to reduce episodes of fatigue will improve Individualization of Care 1. Likes to be called [place] 2. Lives at home with his wife 3. Part of the [facility] program Outcome: Progressing Pt is 2 assist for transfers, can use urinal     Problem: Education: Goal: Knowledge of the prescribed therapeutic regimen will improve Outcome: Progressing Pt education completed, educated on fall prevention, verbalized understanding, pt information guide given  Problem: Nutritional: Goal: Maintenance of adequate nutrition will improve Outcome: Progressing Pt had good po intake, finished all of meal

## 2015-08-20 NOTE — ED Notes (Signed)
Pt from home via [facility] with c/o respiratory infection since 12/22. EMS reports CBG 146, pt placed on 5L humidifed air via [place]. Pt with productive cough, pink tinged sputum upon arrival.

## 2015-08-20 NOTE — ED Notes (Addendum)
MD placed pt on 2L nasal cannula due to sat's dropping to 86% on room air while conversing.  O2 sat 95% on the 2L.

## 2015-08-20 NOTE — H&P (Signed)
[facility]    MR#:  [id number]  DATE OF BIRTH:  08-26-1945  DATE OF ADMISSION:  08/20/2015  PRIMARY CARE PHYSICIAN: follows [facility]  REQUESTING/REFERRING PHYSICIAN: Dr Jacqualine Code  CHIEF COMPLAINT:  Shortness of breath on and off for last several weeks. Cough and congestion.  HISTORY OF PRESENT ILLNESS:  Mario Blackburn  is a 70 y.o. male with a known history of morbid obesity, sleep apnea and noncompliant to CPAP, 2 diabetes, chronic lower extremity edema with chronic venous congestion, hypertension comes to the emergency room with increasing shortness of breath. Patient complains of some cough and congestion. Denies fever. He has found to be hypoxic at 86% on room air while conversing. Patient was placed on 2 L nasal cannula oxygen. He is being admitted for COPD exacerbation acute, mild. Chest x-ray does not show any evidence of pneumonia.  PAST MEDICAL HISTORY:   Past Medical History  Diagnosis Date  . Arthritis   . Asthma   . CHF (congestive heart failure)   . Hypertension   . Stroke   . Diabetes mellitus without complication   . Osteoarthritis   . Cancer   . Hyperlipemia   . Gout     PAST SURGICAL HISTOIRY:   Past Surgical History  Procedure Laterality Date  . Cholecystectomy      SOCIAL HISTORY:   Social History  Substance Use Topics  . Smoking status: Former Research scientist (life sciences)  . Smokeless tobacco: Not on file  . Alcohol Use: No    FAMILY HISTORY:   Family History  Problem Relation Age of Onset  . Family history unknown: Yes    DRUG ALLERGIES:   Allergies  Allergen Reactions  . Morphine And Related Other (See Comments)    Pt states that this medication put him in a coma.  . Percocet [Oxycodone-Acetaminophen] Other (See Comments)    Pt states that this medication put him in a coma.  . Bactrim [Sulfamethoxazole-Trimethoprim] Other (See Comments)    Reaction:   Unknown   . Benadryl [Diphenhydramine Hcl] Hives  . Keflex [Cephalexin] Hives  . Penicillins Hives and Other (See Comments)    Has patient had a PCN reaction causing immediate rash, facial/tongue/throat swelling, SOB or lightheadedness with hypotension: No Has patient had a PCN reaction causing severe rash involving mucus membranes or skin necrosis: No Has patient had a PCN reaction that required hospitalization No Has patient had a PCN reaction occurring within the last 10 years: No If all of the above answers are "NO", then may proceed with Cephalosporin use.    REVIEW OF SYSTEMS:  Review of Systems  Constitutional: Positive for malaise/fatigue. Negative for fever, chills and weight loss.  HENT: Negative for ear discharge, ear pain and nosebleeds.   Eyes: Negative for blurred vision, pain and discharge.  Respiratory: Positive for cough and shortness of breath. Negative for sputum production, wheezing and stridor.   Cardiovascular: Negative for chest pain, palpitations, orthopnea and PND.  Gastrointestinal: Negative for nausea, vomiting, abdominal pain and diarrhea.  Genitourinary: Negative for urgency and frequency.  Musculoskeletal: Negative for back pain and joint pain.  Neurological: Positive for weakness. Negative for sensory change, speech change and focal weakness.  Psychiatric/Behavioral: Negative for depression and hallucinations. The patient is not nervous/anxious.   All other systems reviewed and are negative.    MEDICATIONS AT HOME:   Prior to Admission medications   Medication Sig Start Date End  Date Taking? Authorizing Provider  acetaminophen (TYLENOL) 650 MG CR tablet Take 1,300 mg by mouth 2 (two) times daily.   Yes Historical Provider, MD  albuterol (PROVENTIL HFA;VENTOLIN HFA) 108 (90 BASE) MCG/ACT inhaler Inhale 2 puffs into the lungs every 4 (four) hours as needed for wheezing or shortness of breath.   Yes Historical Provider, MD  albuterol (PROVENTIL) (2.5  MG/3ML) 0.083% nebulizer solution Take 2.5 mg by nebulization 4 (four) times daily as needed for wheezing or shortness of breath.   Yes Historical Provider, MD  allopurinol (ZYLOPRIM) 100 MG tablet Take 100 mg by mouth daily.   Yes Historical Provider, MD  amLODipine-atorvastatin (CADUET) 10-80 MG per tablet Take 1 tablet by mouth at bedtime.   Yes Historical Provider, MD  ARIPiprazole (ABILIFY) 10 MG tablet Take 10 mg by mouth daily.   Yes Historical Provider, MD  aspirin EC 81 MG tablet Take 81 mg by mouth daily.   Yes Historical Provider, MD  bimatoprost (LUMIGAN) 0.01 % SOLN Place 1 drop into both eyes at bedtime.   Yes Historical Provider, MD  brimonidine-timolol (COMBIGAN) 0.2-0.5 % ophthalmic solution Place 1 drop into both eyes 2 (two) times daily.   Yes Historical Provider, MD  cadexomer iodine (IODOSORB) 0.9 % gel Apply 1 application topically daily as needed for wound care.   Yes Historical Provider, MD  divalproex (DEPAKOTE) 250 MG DR tablet Take 750 mg by mouth 2 (two) times daily.   Yes Historical Provider, MD  fluticasone-salmeterol (ADVAIR HFA) 230-21 MCG/ACT inhaler Inhale 1 puff into the lungs 2 (two) times daily.   Yes Historical Provider, MD  furosemide (LASIX) 40 MG tablet Take 40 mg by mouth daily.    Yes Historical Provider, MD  gabapentin (NEURONTIN) 300 MG capsule Take 300 mg by mouth at bedtime.   Yes Historical Provider, MD  guaifenesin (ROBITUSSIN) 100 MG/5ML syrup Take 100-200 mg by mouth every 4 (four) hours as needed for cough.   Yes Historical Provider, MD  insulin aspart (NOVOLOG) 100 UNIT/ML injection Inject 10 Units into the skin 3 (three) times daily before meals.   Yes Historical Provider, MD  insulin glargine (LANTUS) 100 UNIT/ML injection Inject 35 Units into the skin at bedtime.   Yes Historical Provider, MD  ketoconazole (NIZORAL) 2 % shampoo Apply 1 application topically 3 (three) times a week.   Yes Historical Provider, MD  levothyroxine (SYNTHROID,  LEVOTHROID) 50 MCG tablet Take 50 mcg by mouth daily before breakfast.   Yes Historical Provider, MD  lisinopril (PRINIVIL,ZESTRIL) 40 MG tablet Take 40 mg by mouth daily.   Yes Historical Provider, MD  metoprolol succinate (TOPROL-XL) 25 MG 24 hr tablet Take 25 mg by mouth daily.   Yes Historical Provider, MD  montelukast (SINGULAIR) 10 MG tablet Take 10 mg by mouth at bedtime.   Yes Historical Provider, MD  nitroGLYCERIN (NITROSTAT) 0.4 MG SL tablet Place 0.4 mg under the tongue every 5 (five) minutes as needed for chest pain.   Yes Historical Provider, MD  omeprazole (PRILOSEC) 20 MG capsule Take 20 mg by mouth daily.   Yes Historical Provider, MD  ondansetron (ZOFRAN-ODT) 4 MG disintegrating tablet Take 4 mg by mouth every 8 (eight) hours as needed for nausea or vomiting.   Yes Historical Provider, MD  potassium chloride (KLOR-CON) 8 MEQ tablet Take 8 mEq by mouth daily.   Yes Historical Provider, MD  tiotropium (SPIRIVA) 18 MCG inhalation capsule Place 18 mcg into inhaler and inhale daily.   Yes Historical Provider,  MD  venlafaxine XR (EFFEXOR-XR) 150 MG 24 hr capsule Take 150 mg by mouth 2 (two) times daily.   Yes Historical Provider, MD  Vitamin D, Ergocalciferol, (DRISDOL) 50000 UNITS CAPS capsule Take 50,000 Units by mouth every 30 (thirty) days.   Yes Historical Provider, MD  Zinc Oxide (DESITIN) 13 % CREA Apply 1 application topically daily as needed (for irritation).   Yes Historical Provider, MD      VITAL SIGNS:  Blood pressure 158/74, pulse 83, temperature 97.5 F (36.4 C), temperature source Oral, resp. rate 15, weight 166.924 kg (368 lb), SpO2 96 %.  PHYSICAL EXAMINATION:  GENERAL:  70 y.o.-year-old patient lying in the bed with no acute distress. Morbidly obese, disheveled EYES: Pupils equal, round, reactive to light and accommodation. No scleral icterus. Extraocular muscles intact.  HEENT: Head atraumatic, normocephalic. Oropharynx and nasopharynx clear.  NECK:  Supple, no  jugular venous distention. No thyroid enlargement, no tenderness.  LUNGS: Normal breath sounds bilaterally, no wheezing, rales,rhonchi or crepitation. No use of accessory muscles of respiration.  CARDIOVASCULAR: S1, S2 normal. No murmurs, rubs, or gallops.  ABDOMEN: Soft, nontender, nondistended. Bowel sounds present. No organomegaly or mass.  EXTREMITIES: +++ pedal edema, no cyanosis, or clubbing.  Chronic venous stasis changes with significant amount of dry skin. NEUROLOGIC: Cranial nerves II through XII are intact. Muscle strength 5/5 in all extremities. Sensation intact. Gait not checked.  PSYCHIATRIC: The patient is alert and oriented x 3.  SKIN: No obvious rash, lesion, or ulcer.   LABORATORY PANEL:   CBC  Recent Labs Lab 08/20/15 1301  WBC 7.9  HGB 12.1*  HCT 36.4*  PLT 221   ------------------------------------------------------------------------------------------------------------------  Chemistries   Recent Labs Lab 08/20/15 1301  NA 141  K 3.9  CL 103  CO2 32  GLUCOSE 142*  BUN 18  CREATININE 0.78  CALCIUM 8.6*  AST 26  ALT 21  ALKPHOS 72  BILITOT 0.3   ------------------------------------------------------------------------------------------------------------------  Cardiac Enzymes  Recent Labs Lab 08/20/15 1301  TROPONINI <0.03   ------------------------------------------------------------------------------------------------------------------  RADIOLOGY:  Dg Chest Portable 1 View  08/20/2015  CLINICAL DATA:  Productive cough.  Hypoxia. EXAM: PORTABLE CHEST 1 VIEW COMPARISON:  04/27/2015 FINDINGS: Heart size and pulmonary vascularity are normal. Slight haziness at the lung bases is felt to be due to a shallow inspiration. No definable acute infiltrates or effusions. No acute osseous abnormality. IMPRESSION: No acute abnormalities.  Hypoaeration at the lung bases. Electronically Signed   By: Lorriane Shire M.D.   On: 08/20/2015 12:22    IMPRESSION  AND PLAN:  Mario Blackburn  is a 70 y.o. male with a known history of morbid obesity, sleep apnea and noncompliant to CPAP, 2 diabetes, chronic lower extremity edema with chronic venous congestion, hypertension comes to the emergency room with increasing shortness of breath. Patient complains of some cough and congestion.   1. Acute mild COPD exacerbation with hypoxia Patient's sats dropped to 86% while talking in the emergency room. Currently he is on 2 L 98% saturations. He has history of chronic COPD. We'll give by mouth steroids. Start at 50 mg tapering down 10 mg daily then stop Continue oral inhalers and nebulizers as needed Resume Singulair Assessment home oxygen use  2. Morbid obesity with history of severe sleep apnea Patient noncompliant to CPAP  3. Hypertension Continue amlodipine, lisinopril, Toprol-XL  4. Type 2 diabetes Continue Lantus and NovoLog along with sliding scale  5. DVT prophylaxis subcutaneous Lovenox  Abele was discussed with patient and patient's  wife   All the records are reviewed and case discussed with ED provider. Management plans discussed with the patient, family and they are in agreement.  CODE STATUS: Full  TOTAL TIME TAKING CARE OF THIS PATIENT: 50 minutes.    Oluchi Pucci M.D on 08/20/2015 at 3:20 PM  Between 7am to 6pm - Pager - [phone]  After 6pm go to [email or web address] - password [email or web address] [facility]  Office  [phone]  CC: Primary care physician; Pcp Not In System

## 2015-08-21 LAB — GLUCOSE, CAPILLARY: Glucose-Capillary: 172 mg/dL — ABNORMAL HIGH (ref 65–99)

## 2015-08-21 MED ORDER — PREDNISONE 10 MG (21) PO TBPK: 10.0000 mg | ORAL_TABLET | Freq: Every day | ORAL | Status: DC

## 2015-08-21 NOTE — Plan of Care (Signed)
Problem: Nutritional: Goal: Maintenance of adequate nutrition will improve Outcome: Progressing Pt up to chair or to bed. Rested well. Oxygen at 2 liters. DOE. CBG at bedtime was 305. 35 units of Lantus given. Pt said that he may get to go home today.

## 2015-08-21 NOTE — Care Management (Signed)
Admitted to [facility] with the diagnosis of hypoxia. Lives with wife, [name] [phone]). A member of the [facility]  For the last 4-5 years. Attends this program Monday-Tuesday-Wednesday-Friday. Transportation is provided by the [employer]. A nursing assistance comes to the home 7 days a week 2.5 hours a day. She prepares meals, changes the bed and gives personal care. No home oxygen. [facility] several times in the past. No falls. "Eats to not get [place]."  [facility] will transport, if needed. Shelbie Ammons RN MSN CCM Care Management [phone]

## 2015-08-21 NOTE — Discharge Summary (Signed)
Mario Blackburn, is a 70 y.o. male  DOB [birth year]  MRN [id number].  Admission date:  08/20/2015  Admitting Physician  Fritzi Mandes, MD  Discharge Date:  08/21/2015   Primary MD  Pcp Not In System  Recommendations for primary care physician for things to follow:  With [facility] Dr. as scheduled.   Admission Diagnosis  Asthma exacerbation [J45.901] Requires supplemental oxygen [Z99.81]   Discharge Diagnosis  Asthma exacerbation [J45.901] Requires supplemental oxygen [Z99.81]    Active Problems:   Hypoxia      Past Medical History  Diagnosis Date  . Arthritis   . Asthma   . CHF (congestive heart failure) ([place])   . Hypertension   . Stroke ([place])   . Diabetes mellitus without complication ([place])   . Osteoarthritis   . Cancer ([place])   . Hyperlipemia   . Gout     Past Surgical History  Procedure Laterality Date  . Cholecystectomy         History of present illness and  Hospital Course:     Kindly see H&P for history of present illness and admission details, please review complete Labs, Consult reports and Test reports for all details in brief  HPI  from the history and physical done on the day of admission Mario Blackburn is a 70 y.o. male with a known history of morbid obesity, sleep apnea and noncompliant to CPAP, 2 diabetes, chronic lower extremity edema with chronic venous congestion, hypertension comes to the emergency room with increasing shortness of breath. Patient complains of some cough and congestion. Denies fever. He has found to be hypoxic at 86% on room air while conversing. Patient was placed on 2 L nasal cannula oxygen. He is being admitted for COPD exacerbation acute, mild. Chest x-ray does not show any evidence of pneumonia.    Hospital Course  #1 acute respiratory failure with hypoxia  secondary to COPD exacerbation: Symptoms nicely improved with nebulizers, steroids, oxygen. Did not have any wheezing today. saturations improved, patient maintained above 88% even on room air at rest and exertion. Discharged home and patient will follow-up with his physician and they will arrange for physical therapy. Discharge home with tapering course of prednisone. Diabetes mellitus type 2: Patient to he is on Lantus 35 units daily at bedtime, NovoLog 10 units 3 times a day with meals continue that. 3 morbid obesity: The sleep apnea: Noncompliant with CPAP. #4 history of for chronic diastolic heart failure: Stable 5.History of hypertension ;stable 6.History of hyperlipidemia 7. Hypothyroidism continue Synthroid.   Discharge Condition: stable   Follow UP      Discharge Instructions  and  Discharge Medications        Medication List    TAKE these medications        acetaminophen 650 MG CR tablet  Commonly known as:  TYLENOL  Take 1,300 mg by mouth 2 (two) times daily.     albuterol 108 (90 Base) MCG/ACT inhaler  Commonly known as:  PROVENTIL HFA;VENTOLIN HFA  Inhale 2 puffs into the lungs every 4 (four) hours as needed for wheezing or shortness of breath.     albuterol (2.5 MG/3ML) 0.083% nebulizer solution  Commonly known as:  PROVENTIL  Take 2.5 mg by nebulization 4 (four) times daily as needed for wheezing or shortness of breath.     allopurinol 100 MG tablet  Commonly known as:  ZYLOPRIM  Take 100 mg by mouth daily.     amLODipine-atorvastatin 10-80 MG tablet  Commonly known as:  CADUET  Take 1 tablet by mouth at bedtime.     ARIPiprazole 10 MG tablet  Commonly known as:  ABILIFY  Take 10 mg by mouth daily.     aspirin EC 81 MG tablet  Take 81 mg by mouth daily.     COMBIGAN 0.2-0.5 % ophthalmic solution  Generic drug:  brimonidine-timolol  Place 1 drop into both eyes 2 (two) times daily.     DESITIN 13 % Crea  Generic drug:  Zinc Oxide  Apply 1  application topically daily as needed (for irritation).     divalproex 250 MG DR tablet  Commonly known as:  DEPAKOTE  Take 750 mg by mouth 2 (two) times daily.     fluticasone-salmeterol 230-21 MCG/ACT inhaler  Commonly known as:  ADVAIR HFA  Inhale 1 puff into the lungs 2 (two) times daily.     furosemide 40 MG tablet  Commonly known as:  LASIX  Take 40 mg by mouth daily.     gabapentin 300 MG capsule  Commonly known as:  NEURONTIN  Take 300 mg by mouth at bedtime.     guaifenesin 100 MG/5ML syrup  Commonly known as:  ROBITUSSIN  Take 100-200 mg by mouth every 4 (four) hours as needed for cough.     insulin aspart 100 UNIT/ML injection  Commonly known as:  novoLOG  Inject 10 Units into the skin 3 (three) times daily before meals.     insulin glargine 100 UNIT/ML injection  Commonly known as:  LANTUS  Inject 35 Units into the skin at bedtime.     IODOSORB 0.9 % gel  Generic drug:  cadexomer iodine  Apply 1 application topically daily as needed for wound care.     ketoconazole 2 % shampoo  Commonly known as:  NIZORAL  Apply 1 application topically 3 (three) times a week.     levothyroxine 50 MCG tablet  Commonly known as:  SYNTHROID, LEVOTHROID  Take 50 mcg by mouth daily before breakfast.     lisinopril 40 MG tablet  Commonly known as:  PRINIVIL,ZESTRIL  Take 40 mg by mouth daily.     LUMIGAN 0.01 % Soln  Generic drug:  bimatoprost  Place 1 drop into both eyes at bedtime.     metoprolol succinate 25 MG 24 hr tablet  Commonly known as:  TOPROL-XL  Take 25 mg by mouth daily.     montelukast 10 MG tablet  Commonly known as:  SINGULAIR  Take 10 mg by mouth at bedtime.     nitroGLYCERIN 0.4 MG SL tablet  Commonly known as:  NITROSTAT  Place 0.4 mg under the tongue every 5 (five) minutes as needed for chest pain.     omeprazole 20 MG capsule  Commonly known as:  PRILOSEC  Take 20 mg by mouth daily.     ondansetron 4 MG disintegrating tablet  Commonly  known as:  ZOFRAN-ODT  Take 4 mg by mouth every 8 (eight) hours as needed for nausea or vomiting.     potassium chloride 8 MEQ tablet  Commonly known as:  KLOR-CON  Take 8 mEq by mouth daily.     predniSONE 10 MG (21) Tbpk tablet  Commonly known as:  STERAPRED UNI-PAK 21 TAB  Take 1 tablet (10 mg total) by mouth daily. Take as directed     tiotropium 18 MCG inhalation capsule  Commonly known as:  SPIRIVA  Place 18 mcg into inhaler and inhale daily.  venlafaxine XR 150 MG 24 hr capsule  Commonly known as:  EFFEXOR-XR  Take 150 mg by mouth 2 (two) times daily.     Vitamin D (Ergocalciferol) 50000 units Caps capsule  Commonly known as:  DRISDOL  Take 50,000 Units by mouth every 30 (thirty) days.          Diet and Activity recommendation: See Discharge Instructions above   Consults obtained - PT   Major procedures and Radiology Reports - PLEASE review detailed and final reports for all details, in brief -      Dg Chest Portable 1 View  08/20/2015  CLINICAL DATA:  Productive cough.  Hypoxia. EXAM: PORTABLE CHEST 1 VIEW COMPARISON:  04/27/2015 FINDINGS: Heart size and pulmonary vascularity are normal. Slight haziness at the lung bases is felt to be due to a shallow inspiration. No definable acute infiltrates or effusions. No acute osseous abnormality. IMPRESSION: No acute abnormalities.  Hypoaeration at the lung bases. Electronically Signed   By: Lorriane Shire M.D.   On: 08/20/2015 12:22    Micro Results     No results found for this or any previous visit (from the past 240 hour(s)).     Today   Subjective:   Mario Blackburn today has no headache,no chest abdominal pain,no new weakness tingling or numbness, feels much better wants to go home today.   Objective:   Blood pressure 104/42, pulse 69, temperature 97.8 F (36.6 C), temperature source Oral, resp. rate 17, height 5\' 9"  (1.753 m), weight 158.668 kg (349 lb 12.8 oz), SpO2 93 %.   Intake/Output Summary  (Last 24 hours) at 08/21/15 1205 Last data filed at 08/21/15 0731  Gross per 24 hour  Intake    480 ml  Output    600 ml  Net   -120 ml    Exam Awake Alert, Oriented x 3, No new F.N deficits, Normal affect [place].AT,PERRAL Supple Neck,No JVD, No cervical lymphadenopathy appriciated.  Symmetrical Chest wall movement, Good air movement bilaterally, CTAB RRR,No Gallops,Rubs or new Murmurs, No Parasternal Heave +ve B.Sounds, Abd Soft, Non tender, No organomegaly appriciated, No rebound -guarding or rigidity. No Cyanosis, Clubbing or edema, No new Rash or bruise  Data Review   CBC w Diff: Lab Results  Component Value Date   WBC 7.9 08/20/2015   WBC 11.9* 09/09/2013   HGB 12.1* 08/20/2015   HGB 12.4* 09/09/2013   HCT 36.4* 08/20/2015   HCT 38.8* 09/09/2013   PLT 221 08/20/2015   PLT 170 09/09/2013   LYMPHOPCT 36 08/20/2015   LYMPHOPCT 23.4 09/09/2013   MONOPCT 13 08/20/2015   MONOPCT 14.7 09/09/2013   EOSPCT 5 08/20/2015   EOSPCT 3.6 09/09/2013   BASOPCT 0 08/20/2015   BASOPCT 0.4 09/09/2013    CMP: Lab Results  Component Value Date   NA 141 08/20/2015   NA 136 09/09/2013   K 3.9 08/20/2015   K 4.8 09/09/2013   CL 103 08/20/2015   CL 101 09/09/2013   CO2 32 08/20/2015   CO2 32 09/09/2013   BUN 18 08/20/2015   BUN 13 09/09/2013   CREATININE 0.78 08/20/2015   CREATININE 0.95 09/09/2013   PROT 7.3 08/20/2015   PROT 7.1 09/09/2013   ALBUMIN 3.2* 08/20/2015   ALBUMIN 3.0* 09/09/2013   BILITOT 0.3 08/20/2015   BILITOT 0.7 09/09/2013   ALKPHOS 72 08/20/2015   ALKPHOS 84 09/09/2013   AST 26 08/20/2015   AST 44* 09/09/2013   ALT 21 08/20/2015   ALT 36 09/09/2013  .  Total Time in preparing paper work, data evaluation and todays exam - 54 minutes  Rowene Suto M.D on 08/21/2015 at 12:05 PM    Note: This dictation was prepared with Dragon dictation along with smaller phrase technology. Any transcriptional errors that result from this process are  unintentional.

## 2015-08-21 NOTE — Progress Notes (Signed)
Inpatient Diabetes Program Recommendations  AACE/ADA: New Consensus Statement on Inpatient Glycemic Control (2015)  Target Ranges:  Prepandial:   less than 140 mg/dL      Peak postprandial:   less than 180 mg/dL (1-2 hours)      Critically ill patients:  140 - 180 mg/dL  Results for ADRIAN, KUKUK (MRN [id number]) as of 08/21/2015 10:54  Ref. Range 08/20/2015 17:03 08/20/2015 21:39 08/21/2015 07:27  Glucose-Capillary Latest Ref Range: 65-99 mg/dL 198 (H) 305 (H) 172 (H)   Review of Glycemic Control  Diabetes history: DM2 Outpatient Diabetes medications: Lantus 35 units QHS, Novolog 10 units TID with meals Current orders for Inpatient glycemic control: Lantus 35 units QHS, Novolog 10 units TID with meals  Inpatient Diabetes Program Recommendations: Correction (SSI): While inpatient, please consider ordering Novolog correction scale ACHS (in addition to Novolog 10 units TID meal coverage).  Thanks, Barnie Alderman, RN, MSN, CDE Diabetes Coordinator Inpatient Diabetes Program [phone] (Team Pager from [place] to [place]) [phone] ([facility] office) [phone] [facility] office) [phone] [facility] office)

## 2015-08-21 NOTE — Care Management Obs Status (Signed)
[place] NOTIFICATION   Patient Details  Name: Mario Blackburn MRN: [id number] Date of Birth: 07-09-1946   Medicare Observation Status Notification Given:  Yes    Shelbie Ammons, RN 08/21/2015, 7:58 AM

## 2015-08-21 NOTE — Evaluation (Signed)
Physical Therapy Evaluation Patient Details Name: Steele Zimmerly MRN: [id number] DOB: [birth year] Today's Date: 08/21/2015   History of Present Illness  presented to ER secondary to worsening SOB, cough, noted hypoxic on RA at 86%; admitted with COPD exacerbation.  Clinical Impression  Upon evaluation, patient alert and oriented to basic information; mild expressive aphasia noted, but able to easily make needs known.  Bilat UE/LE strength and ROM grossly symmetrical and WFL for basic transfers and mobility.  Able to complete sit/stand, basic transfers and gait (20') with bariatric RW and cga for safety.  Decreased cadence and gait speed, but no overt buckling, LOB or significant safety concerns.  Able to maintain sats >93% on RA at rest and with exertion, though reports BORG rating of 8/10 after above mobility. Would benefit from skilled PT to address above deficits and promote optimal return to PLOF; Recommend transition to [facility] upon discharge from acute hospitalization.     Follow Up Recommendations Home health PT    Equipment Recommendations       Recommendations for Other Services       Precautions / Restrictions Precautions Precautions: Fall Restrictions Weight Bearing Restrictions: No      Mobility  Bed Mobility               General bed mobility comments: seated in recliner beginning/end of session  Transfers Overall transfer level: Needs assistance Equipment used: Rolling walker (2 wheeled) Transfers: Sit to/from Stand Sit to Stand: Min guard            Ambulation/Gait Ambulation/Gait assistance: Min guard Ambulation Distance (Feet): 80 Feet Assistive device: Rolling walker (2 wheeled)       General Gait Details: broad BOS with forward trunk flexion; decreased step height/length bilat, decreased heel strike/toe off; decreased cadence and overall gait speed, but no overt buckling or LOB  Stairs            Wheelchair Mobility    Modified Rankin  (Stroke Patients Only)       Balance Overall balance assessment: Needs assistance Sitting-balance support: No upper extremity supported;Feet supported Sitting balance-Leahy Scale: Good     Standing balance support: Bilateral upper extremity supported Standing balance-Leahy Scale: Fair                               Pertinent Vitals/Pain Pain Assessment: No/denies pain    Home Living Family/patient expects to be discharged to:: Private residence Living Arrangements: Spouse/significant other Available Help at Discharge: Family Type of Home: House Home Access: Stairs to enter Entrance Stairs-Rails: None Entrance Stairs-Number of Steps: 1 Home Layout: One level Home Equipment: Environmental consultant - 4 wheels;Walker - 2 wheels      Prior Function Level of Independence: Independent with assistive device(s)         Comments: Mod indep for household and limited community mobiltiy with RW vs. [id number]; denies recent fall history.  [facility] (goes to center 4 days/week, has aide 7 days/week for 2.5 hours/day).     Hand Dominance        Extremity/Trunk Assessment   Upper Extremity Assessment: Overall WFL for tasks assessed           Lower Extremity Assessment: Overall WFL for tasks assessed (grossly 4/5 throughout; redness to bilat LEs distally with dry, flaky skin, +1-2 pitting edema)         Communication   Communication: HOH  Cognition Arousal/Alertness: Awake/alert Behavior During  Therapy: WFL for tasks assessed/performed Overall Cognitive Status: Within Functional Limits for tasks assessed (mild expressive language difficulties (previous CVA), but able to make needs known)                      General Comments      Exercises        Assessment/Plan    PT Assessment Patient needs continued PT services  PT Diagnosis Difficulty walking;Generalized weakness   PT Problem List Decreased activity tolerance;Decreased  mobility;Cardiopulmonary status limiting activity  PT Treatment Interventions DME instruction;Gait training;Stair training;Functional mobility training;Therapeutic activities;Therapeutic exercise;Balance training;Patient/family education   PT Goals (Current goals can be found in the Care Plan section) Acute Rehab PT Goals Patient Stated Goal: "to be able to get back home" PT Goal Formulation: With patient Time For Goal Achievement: 09/04/15 Potential to Achieve Goals: Good    Frequency Min 2X/week   Barriers to discharge        Co-evaluation               End of Session Equipment Utilized During Treatment: Gait belt Activity Tolerance: Patient tolerated treatment well Patient left: in chair;with call bell/phone within reach;with chair alarm set Nurse Communication: Mobility status         Time: [id number] PT Time Calculation (min) (ACUTE ONLY): 14 min   Charges:   PT Evaluation $PT Eval Low Complexity: 1 Procedure     PT G Codes:   PT G-Codes **NOT FOR INPATIENT CLASS** Functional Assessment Tool Used: clinical judgement Functional Limitation: Mobility: Walking and moving around Mobility: Walking and Moving Around Current Status [id number]): At least 20 percent but less than 40 percent impaired, limited or restricted Mobility: Walking and Moving Around Goal Status [phone]): At least 1 percent but less than 20 percent impaired, limited or restricted   Spurgeon Gancarz H. Owens Shark, PT, DPT, NCS 08/21/2015, 11:14 AM [phone]

## 2016-12-06 ENCOUNTER — Encounter: Payer: Self-pay | Admitting: Emergency Medicine

## 2016-12-06 ENCOUNTER — Emergency Department
Admission: EM | Admit: 2016-12-06 | Discharge: 2016-12-06 | Disposition: A | Discharge status: Home / Self Care | Payer: Medicare (Managed Care) | Attending: Emergency Medicine | Admitting: Emergency Medicine

## 2016-12-06 ENCOUNTER — Emergency Department: Payer: Medicare (Managed Care)

## 2016-12-06 DIAGNOSIS — I509 Heart failure, unspecified: Secondary | ICD-10-CM | POA: Insufficient documentation

## 2016-12-06 DIAGNOSIS — J40 Bronchitis, not specified as acute or chronic: Secondary | ICD-10-CM | POA: Insufficient documentation

## 2016-12-06 DIAGNOSIS — I110 Hypertensive heart disease with heart failure: Secondary | ICD-10-CM | POA: Insufficient documentation

## 2016-12-06 DIAGNOSIS — R0981 Nasal congestion: Secondary | ICD-10-CM | POA: Diagnosis present

## 2016-12-06 DIAGNOSIS — J449 Chronic obstructive pulmonary disease, unspecified: Secondary | ICD-10-CM | POA: Diagnosis not present

## 2016-12-06 DIAGNOSIS — E119 Type 2 diabetes mellitus without complications: Secondary | ICD-10-CM | POA: Diagnosis not present

## 2016-12-06 DIAGNOSIS — Z7982 Long term (current) use of aspirin: Secondary | ICD-10-CM | POA: Insufficient documentation

## 2016-12-06 DIAGNOSIS — Z87891 Personal history of nicotine dependence: Secondary | ICD-10-CM | POA: Insufficient documentation

## 2016-12-06 DIAGNOSIS — Z794 Long term (current) use of insulin: Secondary | ICD-10-CM | POA: Insufficient documentation

## 2016-12-06 DIAGNOSIS — Z79899 Other long term (current) drug therapy: Secondary | ICD-10-CM | POA: Diagnosis not present

## 2016-12-06 MED ORDER — AZITHROMYCIN 250 MG PO TABS: ORAL_TABLET | ORAL | 0 refills | Status: AC

## 2016-12-06 MED ORDER — IPRATROPIUM BROMIDE 0.03 % NA SOLN: 2.0000 | Freq: Three times a day (TID) | NASAL | 0 refills | Status: DC

## 2016-12-06 NOTE — Discharge Instructions (Signed)
Please take all of your antibiotics as prescribed and follow up with your primary care physician as needed. Return to the emergency department for any concerns such as worsening shortness of breath, fevers, chills, or for any other concerns.  It was a pleasure to take care of you today, and thank you for coming to our emergency department.  If you have any questions or concerns before leaving please ask the nurse to grab me and I'm more than happy to go through your aftercare instructions again.  If you were prescribed any opioid pain medication today such as Norco, Vicodin, Percocet, morphine, hydrocodone, or oxycodone please make sure you do not drive when you are taking this medication as it can alter your ability to drive safely.  If you have any concerns once you are home that you are not improving or are in fact getting worse before you can make it to your follow-up appointment, please do not hesitate to call 911 and come back for further evaluation.  Darel Hong MD   Dg Chest 2 View  Result Date: 12/06/2016 CLINICAL DATA:  Patient with history of asthma, CHF, hypertension and diabetes. Cough. Shortness of breath. EXAM: CHEST  2 VIEW COMPARISON:  Chest radiograph 08/20/2015 FINDINGS: Cardiomegaly. Aortic atherosclerosis. No consolidative pulmonary opacities. No pleural effusion or pneumothorax. Thoracic spine degenerative changes. IMPRESSION: Cardiomegaly.  No acute cardiopulmonary process. Aortic Atherosclerosis (ICD10-I70.0). Electronically Signed   By: Lovey Newcomer M.D.   On: 12/06/2016 13:42

## 2016-12-06 NOTE — ED Provider Notes (Signed)
[facility] Emergency Department Provider Note  ____________________________________________   First MD Initiated Contact with Patient 12/06/16 1319     (approximate)  I have reviewed the triage vital signs and the nursing notes.   HISTORY  Chief Complaint Nasal Congestion    HPI Mario Blackburn is a 71 y.o. male who comes to the emergency department via EMS for nasal congestion and cough productive of small amounts of white sputum that began at 7 AM today. He has a long past medical history including congestive heart failure and COPD but he is not oxygen dependent. He took a dose of his albuterol this morning at home and did not report improvement. He said he has felt feverish but not checked a temperature and he has not had any chills. He denies chest pain. He denies any more leg swelling that he normally has. He feels generally well. He has several sick contacts.   Past Medical History:  Diagnosis Date  . Arthritis   . Asthma   . Cancer ([place])   . CHF (congestive heart failure) ([place])   . Diabetes mellitus without complication ([place])   . Gout   . Hyperlipemia   . Hypertension   . Osteoarthritis   . Stroke [facility])     Patient Active Problem List   Diagnosis Date Noted  . Hypoxia 08/20/2015  . Chest pain 04/27/2015    Past Surgical History:  Procedure Laterality Date  . CHOLECYSTECTOMY      Prior to Admission medications   Medication Sig Start Date End Date Taking? Authorizing Provider  acetaminophen (TYLENOL) 650 MG CR tablet Take 1,300 mg by mouth 2 (two) times daily.    Historical Provider, MD  albuterol (PROVENTIL HFA;VENTOLIN HFA) 108 (90 BASE) MCG/ACT inhaler Inhale 2 puffs into the lungs every 4 (four) hours as needed for wheezing or shortness of breath.    Historical Provider, MD  albuterol (PROVENTIL) (2.5 MG/3ML) 0.083% nebulizer solution Take 2.5 mg by nebulization 4 (four) times daily as needed for wheezing or shortness of breath.     Historical Provider, MD  allopurinol (ZYLOPRIM) 100 MG tablet Take 100 mg by mouth daily.    Historical Provider, MD  amLODipine-atorvastatin (CADUET) 10-80 MG per tablet Take 1 tablet by mouth at bedtime.    Historical Provider, MD  ARIPiprazole (ABILIFY) 10 MG tablet Take 10 mg by mouth daily.    Historical Provider, MD  aspirin EC 81 MG tablet Take 81 mg by mouth daily.    Historical Provider, MD  azithromycin (ZITHROMAX Z-PAK) 250 MG tablet Take 2 tablets (500 mg) on  Day 1,  followed by 1 tablet (250 mg) once daily on Days 2 through 5. 12/06/16 12/11/16  Darel Hong, MD  bimatoprost (LUMIGAN) 0.01 % SOLN Place 1 drop into both eyes at bedtime.    Historical Provider, MD  brimonidine-timolol (COMBIGAN) 0.2-0.5 % ophthalmic solution Place 1 drop into both eyes 2 (two) times daily.    Historical Provider, MD  cadexomer iodine (IODOSORB) 0.9 % gel Apply 1 application topically daily as needed for wound care.    Historical Provider, MD  divalproex (DEPAKOTE) 250 MG DR tablet Take 750 mg by mouth 2 (two) times daily.    Historical Provider, MD  fluticasone-salmeterol (ADVAIR HFA) 230-21 MCG/ACT inhaler Inhale 1 puff into the lungs 2 (two) times daily.    Historical Provider, MD  furosemide (LASIX) 40 MG tablet Take 40 mg by mouth daily.     Historical Provider, MD  gabapentin (NEURONTIN) 300 MG capsule Take 300 mg by mouth at bedtime.    Historical Provider, MD  guaifenesin (ROBITUSSIN) 100 MG/5ML syrup Take 100-200 mg by mouth every 4 (four) hours as needed for cough.    Historical Provider, MD  insulin aspart (NOVOLOG) 100 UNIT/ML injection Inject 10 Units into the skin 3 (three) times daily before meals.    Historical Provider, MD  insulin glargine (LANTUS) 100 UNIT/ML injection Inject 35 Units into the skin at bedtime.    Historical Provider, MD  ipratropium (ATROVENT) 0.03 % nasal spray Place 2 sprays into the nose 3 (three) times daily. 12/06/16 12/06/17  Darel Hong, MD  ketoconazole  (NIZORAL) 2 % shampoo Apply 1 application topically 3 (three) times a week.    Historical Provider, MD  levothyroxine (SYNTHROID, LEVOTHROID) 50 MCG tablet Take 50 mcg by mouth daily before breakfast.    Historical Provider, MD  lisinopril (PRINIVIL,ZESTRIL) 40 MG tablet Take 40 mg by mouth daily.    Historical Provider, MD  metoprolol succinate (TOPROL-XL) 25 MG 24 hr tablet Take 25 mg by mouth daily.    Historical Provider, MD  montelukast (SINGULAIR) 10 MG tablet Take 10 mg by mouth at bedtime.    Historical Provider, MD  nitroGLYCERIN (NITROSTAT) 0.4 MG SL tablet Place 0.4 mg under the tongue every 5 (five) minutes as needed for chest pain.    Historical Provider, MD  omeprazole (PRILOSEC) 20 MG capsule Take 20 mg by mouth daily.    Historical Provider, MD  ondansetron (ZOFRAN-ODT) 4 MG disintegrating tablet Take 4 mg by mouth every 8 (eight) hours as needed for nausea or vomiting.    Historical Provider, MD  potassium chloride (KLOR-CON) 8 MEQ tablet Take 8 mEq by mouth daily.    Historical Provider, MD  predniSONE (STERAPRED UNI-PAK 21 TAB) 10 MG (21) TBPK tablet Take 1 tablet (10 mg total) by mouth daily. Take as directed 08/21/15   Epifanio Lesches, MD  tiotropium (SPIRIVA) 18 MCG inhalation capsule Place 18 mcg into inhaler and inhale daily.    Historical Provider, MD  venlafaxine XR (EFFEXOR-XR) 150 MG 24 hr capsule Take 150 mg by mouth 2 (two) times daily.    Historical Provider, MD  Vitamin D, Ergocalciferol, (DRISDOL) 50000 UNITS CAPS capsule Take 50,000 Units by mouth every 30 (thirty) days.    Historical Provider, MD  Zinc Oxide (DESITIN) 13 % CREA Apply 1 application topically daily as needed (for irritation).    Historical Provider, MD    Allergies Morphine and related; Percocet [oxycodone-acetaminophen]; Bactrim [sulfamethoxazole-trimethoprim]; Benadryl [diphenhydramine hcl]; Keflex [cephalexin]; and Penicillins  Family History  Problem Relation Age of Onset  . Family history  unknown: Yes    Social History Social History  Substance Use Topics  . Smoking status: Former Research scientist (life sciences)  . Smokeless tobacco: Never Used  . Alcohol use No    Review of Systems Constitutional: Positive fever no chills Eyes: No visual changes. ENT: Positive nasal congestion Cardiovascular: Denies chest pain. Respiratory: Positive shortness of breath. Gastrointestinal: No abdominal pain.  No nausea, no vomiting.  No diarrhea.  No constipation. Genitourinary: Negative for dysuria. Musculoskeletal: Negative for back pain. Skin: Negative for rash. Neurological: Negative for headaches, focal weakness or numbness.  10-point ROS otherwise negative.  ____________________________________________   PHYSICAL EXAM:  VITAL SIGNS: ED Triage Vitals [12/06/16 1316]  Enc Vitals Group     BP 122/62     Pulse Rate 89     Resp 16     Temp 98.4  F (36.9 C)     Temp Source Oral     SpO2 95 %     Weight (!) 361 lb (163.7 kg)     Height 5\' 9"  (1.753 m)     Head Circumference      Peak Flow      Pain Score      Pain Loc      Pain Edu?      Excl. in [place]?     Constitutional: Alert and oriented x 4 Chronically ill-appearing but in no acute distress. Speaks in full clear sentences Eyes: PERRL EOMI. Head: Atraumatic. Nose: No congestion/rhinnorhea. Mouth/Throat: No trismus uvula midline no pharyngeal erythema or exudate but thick white mucus in his mouth Neck: No stridor.   Cardiovascular: Normal rate, regular rhythm. Grossly normal heart sounds.  Good peripheral circulation. Respiratory: Normal respiratory effort.  No retractions. Lungs CTAB and moving good air Gastrointestinal: Morbidly obese but nontender Musculoskeletal: Chronic appearing bilateral lower extremity edema legs are equal in size Neurologic:  Normal speech and language. No gross focal neurologic deficits are appreciated. Skin:  Skin is warm, dry and intact. No rash noted. Psychiatric: Mood and affect are normal. Speech and  behavior are normal.    ____________________________________________ ________________________________   LABS (all labs ordered are listed, but only abnormal results are displayed)  Labs Reviewed - No data to display  ____________________________________  EKG   ____________________________________________  RADIOLOGY   ____________________________________________   PROCEDURES  Procedure(s) performed: no  Procedures  Critical Care performed: no  ____________________________________________   INITIAL IMPRESSION / ASSESSMENT AND PLAN / ED COURSE  Pertinent labs & imaging results that were available during my care of the patient were reviewed by me and considered in my medical decision making (see chart for details).  The patient arrives chronically ill-appearing but in no acute distress. His lungs are relatively clear and he is saturating 97% on room air. He primarily came to the emergency department today because it was Saturday and his primary care physician was closed. At this point while his chest x-ray is negative I think there is little harm in a short course of azithromycin and ipratropium nasal spray. He is discharged home in improved condition.      ____________________________________________   FINAL CLINICAL IMPRESSION(S) / ED DIAGNOSES  Final diagnoses:  Bronchitis      NEW MEDICATIONS STARTED DURING THIS VISIT:  New Prescriptions   AZITHROMYCIN (ZITHROMAX Z-PAK) 250 MG TABLET    Take 2 tablets (500 mg) on  Day 1,  followed by 1 tablet (250 mg) once daily on Days 2 through 5.   IPRATROPIUM (ATROVENT) 0.03 % NASAL SPRAY    Place 2 sprays into the nose 3 (three) times daily.     Note:  This document was prepared using Dragon voice recognition software and may include unintentional dictation errors.     Darel Hong, MD 12/06/16 [phone]

## 2016-12-06 NOTE — ED Notes (Signed)
Signature pad not working.  Patient verbalized understanding of discharge instructions and has no further questions.  Discharge instructions reviewed with [facility] over the phone.

## 2016-12-06 NOTE — ED Triage Notes (Signed)
Pt to ED via [facility] from home c/o cough and spitting up phlegm since 7am today.  Pt denies pain or fevers or SOB.  Pt with [facility] and states they told him to come here today when he called.

## 2016-12-06 NOTE — ED Notes (Signed)
Spoke with [facility] MD and updated about patient.

## 2017-09-30 ENCOUNTER — Encounter
Admission: RE | Admit: 2017-09-30 | Discharge: 2017-09-30 | Disposition: A | Discharge status: Home / Self Care | Payer: Medicare (Managed Care) | Source: Ambulatory Visit | Attending: Internal Medicine | Admitting: Internal Medicine

## 2017-11-16 ENCOUNTER — Encounter
Admission: RE | Admit: 2017-11-16 | Discharge: 2017-11-16 | Disposition: A | Discharge status: Home / Self Care | Payer: Medicare (Managed Care) | Source: Ambulatory Visit | Attending: Internal Medicine | Admitting: Internal Medicine

## 2018-03-15 ENCOUNTER — Encounter
Admission: RE | Admit: 2018-03-15 | Discharge: 2018-03-15 | Disposition: A | Discharge status: Home / Self Care | Payer: Medicare (Managed Care) | Source: Ambulatory Visit | Attending: Internal Medicine | Admitting: Internal Medicine

## 2018-03-18 ENCOUNTER — Other Ambulatory Visit: Payer: Self-pay

## 2018-03-18 MED ORDER — TRAMADOL HCL 50 MG PO TABS: 50.0000 mg | ORAL_TABLET | Freq: Four times a day (QID) | ORAL | 0 refills | Status: DC | PRN

## 2018-03-18 NOTE — Telephone Encounter (Signed)
Rx sent to [facility] phone : [phone] , fax : [phone]  

## 2018-09-12 ENCOUNTER — Emergency Department: Payer: Medicare (Managed Care)

## 2018-09-12 ENCOUNTER — Inpatient Hospital Stay
Admission: EM | Admit: 2018-09-12 | Discharge: 2018-09-16 | DRG: 603 | Disposition: A | Discharge status: Skilled Nursing Facility | Payer: Medicare (Managed Care) | Source: Skilled Nursing Facility | Attending: Internal Medicine | Admitting: Internal Medicine

## 2018-09-12 ENCOUNTER — Other Ambulatory Visit: Payer: Self-pay

## 2018-09-12 DIAGNOSIS — Z885 Allergy status to narcotic agent status: Secondary | ICD-10-CM | POA: Diagnosis not present

## 2018-09-12 DIAGNOSIS — Z79899 Other long term (current) drug therapy: Secondary | ICD-10-CM

## 2018-09-12 DIAGNOSIS — E119 Type 2 diabetes mellitus without complications: Secondary | ICD-10-CM | POA: Diagnosis not present

## 2018-09-12 DIAGNOSIS — E039 Hypothyroidism, unspecified: Secondary | ICD-10-CM | POA: Diagnosis present

## 2018-09-12 DIAGNOSIS — L89891 Pressure ulcer of other site, stage 1: Secondary | ICD-10-CM | POA: Diagnosis present

## 2018-09-12 DIAGNOSIS — I5032 Chronic diastolic (congestive) heart failure: Secondary | ICD-10-CM | POA: Diagnosis present

## 2018-09-12 DIAGNOSIS — H5789 Other specified disorders of eye and adnexa: Secondary | ICD-10-CM | POA: Diagnosis not present

## 2018-09-12 DIAGNOSIS — I739 Peripheral vascular disease, unspecified: Secondary | ICD-10-CM

## 2018-09-12 DIAGNOSIS — Z7952 Long term (current) use of systemic steroids: Secondary | ICD-10-CM

## 2018-09-12 DIAGNOSIS — Z794 Long term (current) use of insulin: Secondary | ICD-10-CM

## 2018-09-12 DIAGNOSIS — M1990 Unspecified osteoarthritis, unspecified site: Secondary | ICD-10-CM | POA: Diagnosis present

## 2018-09-12 DIAGNOSIS — I890 Lymphedema, not elsewhere classified: Secondary | ICD-10-CM | POA: Diagnosis present

## 2018-09-12 DIAGNOSIS — Z9049 Acquired absence of other specified parts of digestive tract: Secondary | ICD-10-CM | POA: Diagnosis not present

## 2018-09-12 DIAGNOSIS — Z7951 Long term (current) use of inhaled steroids: Secondary | ICD-10-CM

## 2018-09-12 DIAGNOSIS — L03115 Cellulitis of right lower limb: Secondary | ICD-10-CM

## 2018-09-12 DIAGNOSIS — I878 Other specified disorders of veins: Secondary | ICD-10-CM | POA: Diagnosis present

## 2018-09-12 DIAGNOSIS — M109 Gout, unspecified: Secondary | ICD-10-CM | POA: Diagnosis present

## 2018-09-12 DIAGNOSIS — Z8673 Personal history of transient ischemic attack (TIA), and cerebral infarction without residual deficits: Secondary | ICD-10-CM | POA: Diagnosis not present

## 2018-09-12 DIAGNOSIS — Z7984 Long term (current) use of oral hypoglycemic drugs: Secondary | ICD-10-CM | POA: Diagnosis not present

## 2018-09-12 DIAGNOSIS — G4733 Obstructive sleep apnea (adult) (pediatric): Secondary | ICD-10-CM | POA: Diagnosis present

## 2018-09-12 DIAGNOSIS — Z87891 Personal history of nicotine dependence: Secondary | ICD-10-CM | POA: Diagnosis not present

## 2018-09-12 DIAGNOSIS — Z881 Allergy status to other antibiotic agents status: Secondary | ICD-10-CM

## 2018-09-12 DIAGNOSIS — F329 Major depressive disorder, single episode, unspecified: Secondary | ICD-10-CM | POA: Diagnosis present

## 2018-09-12 DIAGNOSIS — J449 Chronic obstructive pulmonary disease, unspecified: Secondary | ICD-10-CM | POA: Diagnosis present

## 2018-09-12 DIAGNOSIS — Z6841 Body Mass Index (BMI) 40.0 and over, adult: Secondary | ICD-10-CM

## 2018-09-12 DIAGNOSIS — Z888 Allergy status to other drugs, medicaments and biological substances status: Secondary | ICD-10-CM

## 2018-09-12 DIAGNOSIS — Z7982 Long term (current) use of aspirin: Secondary | ICD-10-CM

## 2018-09-12 DIAGNOSIS — Z880 Allergy status to penicillin: Secondary | ICD-10-CM | POA: Diagnosis not present

## 2018-09-12 DIAGNOSIS — E785 Hyperlipidemia, unspecified: Secondary | ICD-10-CM | POA: Diagnosis present

## 2018-09-12 DIAGNOSIS — L8990 Pressure ulcer of unspecified site, unspecified stage: Secondary | ICD-10-CM

## 2018-09-12 DIAGNOSIS — Z79890 Hormone replacement therapy (postmenopausal): Secondary | ICD-10-CM

## 2018-09-12 DIAGNOSIS — I10 Essential (primary) hypertension: Secondary | ICD-10-CM | POA: Diagnosis present

## 2018-09-12 DIAGNOSIS — Z79891 Long term (current) use of opiate analgesic: Secondary | ICD-10-CM

## 2018-09-12 DIAGNOSIS — I110 Hypertensive heart disease with heart failure: Secondary | ICD-10-CM | POA: Diagnosis present

## 2018-09-12 DIAGNOSIS — K219 Gastro-esophageal reflux disease without esophagitis: Secondary | ICD-10-CM | POA: Diagnosis present

## 2018-09-12 LAB — CBC WITH DIFFERENTIAL/PLATELET
ABS IMMATURE GRANULOCYTES: 0.04 10*3/uL (ref 0.00–0.07)
Basophils Absolute: 0 10*3/uL (ref 0.0–0.1)
Basophils Relative: 0 %
Eosinophils Absolute: 1.1 10*3/uL — ABNORMAL HIGH (ref 0.0–0.5)
Eosinophils Relative: 9 %
HCT: 40.8 % (ref 39.0–52.0)
Hemoglobin: 12.7 g/dL — ABNORMAL LOW (ref 13.0–17.0)
Immature Granulocytes: 0 %
Lymphocytes Relative: 28 %
Lymphs Abs: 3.4 10*3/uL (ref 0.7–4.0)
MCH: 28.5 pg (ref 26.0–34.0)
MCHC: 31.1 g/dL (ref 30.0–36.0)
MCV: 91.7 fL (ref 80.0–100.0)
Monocytes Absolute: 1.5 10*3/uL — ABNORMAL HIGH (ref 0.1–1.0)
Monocytes Relative: 13 %
NEUTROS ABS: 6 10*3/uL (ref 1.7–7.7)
Neutrophils Relative %: 50 %
Platelets: 189 10*3/uL (ref 150–400)
RBC: 4.45 MIL/uL (ref 4.22–5.81)
RDW: 15.7 % — ABNORMAL HIGH (ref 11.5–15.5)
WBC: 12 10*3/uL — ABNORMAL HIGH (ref 4.0–10.5)
nRBC: 0 % (ref 0.0–0.2)

## 2018-09-12 LAB — LACTIC ACID, PLASMA
Lactic Acid, Venous: 1.9 mmol/L (ref 0.5–1.9)
Lactic Acid, Venous: 1.9 mmol/L (ref 0.5–1.9)

## 2018-09-12 LAB — COMPREHENSIVE METABOLIC PANEL
ALT: 9 U/L (ref 0–44)
AST: 13 U/L — ABNORMAL LOW (ref 15–41)
Albumin: 3.3 g/dL — ABNORMAL LOW (ref 3.5–5.0)
Alkaline Phosphatase: 51 U/L (ref 38–126)
Anion gap: 9 (ref 5–15)
BUN: 16 mg/dL (ref 8–23)
CO2: 27 mmol/L (ref 22–32)
CREATININE: 0.86 mg/dL (ref 0.61–1.24)
Calcium: 8.6 mg/dL — ABNORMAL LOW (ref 8.9–10.3)
Chloride: 103 mmol/L (ref 98–111)
GFR calc Af Amer: >60 mL/min (ref >60)
GFR calc non Af Amer: >60 mL/min (ref >60)
GLUCOSE: 142 mg/dL — AB (ref 70–99)
Potassium: 4.7 mmol/L (ref 3.5–5.1)
Sodium: 139 mmol/L (ref 135–145)
Total Bilirubin: 0.5 mg/dL (ref 0.3–1.2)
Total Protein: 6.5 g/dL (ref 6.5–8.1)

## 2018-09-12 MED ORDER — VANCOMYCIN HCL IN DEXTROSE 1-5 GM/200ML-% IV SOLN
1000.0000 mg | Freq: Once | INTRAVENOUS | Status: AC
  Administered 2018-09-13: 1000 mg via INTRAVENOUS
  Filled 2018-09-12: qty 200

## 2018-09-12 MED ORDER — VANCOMYCIN HCL IN DEXTROSE 1-5 GM/200ML-% IV SOLN
1000.0000 mg | Freq: Once | INTRAVENOUS | Status: AC
  Administered 2018-09-12: 1000 mg via INTRAVENOUS
  Filled 2018-09-12: qty 200

## 2018-09-12 MED ORDER — SODIUM CHLORIDE 0.9 % IV SOLN
1.0000 g | Freq: Three times a day (TID) | INTRAVENOUS | Status: DC
  Administered 2018-09-13: 1 g via INTRAVENOUS
  Filled 2018-09-12 (×4): qty 1

## 2018-09-12 NOTE — H&P (Signed)
[facility]    MR#:  [id number]  DATE OF BIRTH:  Aug 13, 1946  DATE OF ADMISSION:  09/12/2018  PRIMARY CARE PHYSICIAN: System, Pcp Not In   REQUESTING/REFERRING PHYSICIAN: Paduchowski, MD  CHIEF COMPLAINT:   Chief Complaint  Patient presents with  . Cellulitis    HISTORY OF PRESENT ILLNESS:  Mario Blackburn  is a 73 y.o. male who presents with chief complaint as above.  Patient presents with a complaint of right lower extremity pain with erythema and drainage.  He states that he had been having his legs wrapped due to chronic edema.  He states that that right leg was wrapped for 3 weeks straight.  He started evolving pain in that leg yesterday, and when he unwrapped it he was draining from his leg and foot, and had significant erythema.  He came to ED for evaluation.  Here he was found to have cellulitis in that leg and antibiotics were ordered and hospitalist called for admission  PAST MEDICAL HISTORY:   Past Medical History:  Diagnosis Date  . Arthritis   . Asthma   . Cancer ([place])   . CHF (congestive heart failure) ([place])   . Diabetes mellitus without complication ([place])   . Gout   . Hyperlipemia   . Hypertension   . Osteoarthritis   . Stroke [facility])      PAST SURGICAL HISTORY:   Past Surgical History:  Procedure Laterality Date  . CHOLECYSTECTOMY       SOCIAL HISTORY:   Social History   Tobacco Use  . Smoking status: Former Research scientist (life sciences)  . Smokeless tobacco: Never Used  Substance Use Topics  . Alcohol use: No     FAMILY HISTORY:    Family history reviewed and is non-contributory DRUG ALLERGIES:   Allergies  Allergen Reactions  . Morphine And Related Other (See Comments)    Pt states that this medication put him in a coma.  . Percocet [Oxycodone-Acetaminophen] Other (See Comments)    Pt states that this medication put him in a coma.  . Bactrim [Sulfamethoxazole-Trimethoprim]  Other (See Comments)    Reaction:  Unknown   . Benadryl [Diphenhydramine Hcl] Hives  . Keflex [Cephalexin] Hives  . Penicillins Hives and Other (See Comments)    Has patient had a PCN reaction causing immediate rash, facial/tongue/throat swelling, SOB or lightheadedness with hypotension: No Has patient had a PCN reaction causing severe rash involving mucus membranes or skin necrosis: No Has patient had a PCN reaction that required hospitalization No Has patient had a PCN reaction occurring within the last 10 years: No If all of the above answers are "NO", then may proceed with Cephalosporin use.    MEDICATIONS AT HOME:   Prior to Admission medications   Medication Sig Start Date End Date Taking? Authorizing Provider  acetaminophen (TYLENOL) 650 MG CR tablet Take 1,300 mg by mouth 2 (two) times daily.    [provider]  albuterol (PROVENTIL HFA;VENTOLIN HFA) 108 (90 BASE) MCG/ACT inhaler Inhale 2 puffs into the lungs every 4 (four) hours as needed for wheezing or shortness of breath.    [provider]  albuterol (PROVENTIL) (2.5 MG/3ML) 0.083% nebulizer solution Take 2.5 mg by nebulization 4 (four) times daily as needed for wheezing or shortness of breath.    [provider]  allopurinol (ZYLOPRIM) 100 MG tablet Take 100 mg by mouth daily.    [provider]  amLODipine-atorvastatin (CADUET)  10-80 MG per tablet Take 1 tablet by mouth at bedtime.    [provider]  ARIPiprazole (ABILIFY) 10 MG tablet Take 10 mg by mouth daily.    [provider]  aspirin EC 81 MG tablet Take 81 mg by mouth daily.    [provider]  bimatoprost (LUMIGAN) 0.01 % SOLN Place 1 drop into both eyes at bedtime.    [provider]  brimonidine-timolol (COMBIGAN) 0.2-0.5 % ophthalmic solution Place 1 drop into both eyes 2 (two) times daily.    [provider]  cadexomer iodine (IODOSORB) 0.9 % gel Apply 1 application topically daily as  needed for wound care.    [provider]  divalproex (DEPAKOTE) 250 MG DR tablet Take 750 mg by mouth 2 (two) times daily.    [provider]  fluticasone-salmeterol (ADVAIR HFA) 230-21 MCG/ACT inhaler Inhale 1 puff into the lungs 2 (two) times daily.    [provider]  furosemide (LASIX) 40 MG tablet Take 40 mg by mouth daily.     [provider]  gabapentin (NEURONTIN) 300 MG capsule Take 300 mg by mouth at bedtime.    [provider]  guaifenesin (ROBITUSSIN) 100 MG/5ML syrup Take 100-200 mg by mouth every 4 (four) hours as needed for cough.    [provider]  insulin aspart (NOVOLOG) 100 UNIT/ML injection Inject 10 Units into the skin 3 (three) times daily before meals.    [provider]  insulin glargine (LANTUS) 100 UNIT/ML injection Inject 35 Units into the skin at bedtime.    [provider]  ipratropium (ATROVENT) 0.03 % nasal spray Place 2 sprays into the nose 3 (three) times daily. 12/06/16 12/06/17  Darel Hong, MD  ketoconazole (NIZORAL) 2 % shampoo Apply 1 application topically 3 (three) times a week.    [provider]  levothyroxine (SYNTHROID, LEVOTHROID) 50 MCG tablet Take 50 mcg by mouth daily before breakfast.    [provider]  lisinopril (PRINIVIL,ZESTRIL) 40 MG tablet Take 40 mg by mouth daily.    [provider]  metoprolol succinate (TOPROL-XL) 25 MG 24 hr tablet Take 25 mg by mouth daily.    [provider]  montelukast (SINGULAIR) 10 MG tablet Take 10 mg by mouth at bedtime.    [provider]  nitroGLYCERIN (NITROSTAT) 0.4 MG SL tablet Place 0.4 mg under the tongue every 5 (five) minutes as needed for chest pain.    [provider]  omeprazole (PRILOSEC) 20 MG capsule Take 20 mg by mouth daily.    [provider]  ondansetron (ZOFRAN-ODT) 4 MG disintegrating tablet Take 4 mg by mouth every 8 (eight) hours as needed for nausea or  vomiting.    [provider]  potassium chloride (KLOR-CON) 8 MEQ tablet Take 8 mEq by mouth daily.    [provider]  predniSONE (STERAPRED UNI-PAK 21 TAB) 10 MG (21) TBPK tablet Take 1 tablet (10 mg total) by mouth daily. Take as directed 08/21/15   Epifanio Lesches, MD  tiotropium (SPIRIVA) 18 MCG inhalation capsule Place 18 mcg into inhaler and inhale daily.    [provider]  traMADol (ULTRAM) 50 MG tablet Take 1 tablet (50 mg total) by mouth every 6 (six) hours as needed. 03/18/18   Medina-Vargas, Monina C, NP  venlafaxine XR (EFFEXOR-XR) 150 MG 24 hr capsule Take 150 mg by mouth 2 (two) times daily.    [provider]  Vitamin D, Ergocalciferol, (DRISDOL) 50000 UNITS CAPS  capsule Take 50,000 Units by mouth every 30 (thirty) days.    [provider]  Zinc Oxide (DESITIN) 13 % CREA Apply 1 application topically daily as needed (for irritation).    [provider]    REVIEW OF SYSTEMS:  Review of Systems  Constitutional: Negative for chills, fever, malaise/fatigue and weight loss.  HENT: Negative for ear pain, hearing loss and tinnitus.   Eyes: Negative for blurred vision, double vision, pain and redness.  Respiratory: Negative for cough, hemoptysis and shortness of breath.   Cardiovascular: Negative for chest pain, palpitations, orthopnea and leg swelling.  Gastrointestinal: Negative for abdominal pain, constipation, diarrhea, nausea and vomiting.  Genitourinary: Negative for dysuria, frequency and hematuria.  Musculoskeletal: Negative for back pain, joint pain and neck pain.  Skin:       Right lower extremity erythema, tenderness, drainage  Neurological: Negative for dizziness, tremors, focal weakness and weakness.  Endo/Heme/Allergies: Negative for polydipsia. Does not bruise/bleed easily.  Psychiatric/Behavioral: Negative for depression. The patient is not nervous/anxious and does not have insomnia.      VITAL SIGNS:    Vitals:   09/12/18 2100 09/12/18 2130 09/12/18 2200 09/12/18 2230  BP: (!) 164/73 (!) 154/69 (!) 146/95 137/62  Pulse: 65 66 61 66  Resp: 14 18    Temp:      TempSrc:      SpO2: 99% 100% 99% 100%  Weight:      Height:       Wt Readings from Last 3 Encounters:  09/12/18 134.7 kg  12/06/16 (!) 163.7 kg  08/20/15 (!) 158.7 kg    PHYSICAL EXAMINATION:  Physical Exam  Vitals reviewed. Constitutional: He is oriented to person, place, and time. He appears well-developed and well-nourished. No distress.  HENT:  Head: Normocephalic and atraumatic.  Mouth/Throat: Oropharynx is clear and moist.  Eyes: Pupils are equal, round, and reactive to light. Conjunctivae and EOM are normal. No scleral icterus.  Neck: Normal range of motion. Neck supple. No JVD present. No thyromegaly present.  Cardiovascular: Normal rate, regular rhythm and intact distal pulses. Exam reveals no gallop and no friction rub.  No murmur heard. Respiratory: Effort normal and breath sounds normal. No respiratory distress. He has no wheezes. He has no rales.  GI: Soft. Bowel sounds are normal. He exhibits no distension. There is no abdominal tenderness.  Musculoskeletal: Normal range of motion.        General: No edema.     Comments: No arthritis, no gout  Lymphadenopathy:    He has no cervical adenopathy.  Neurological: He is alert and oriented to person, place, and time. No cranial nerve deficit.  No dysarthria, no aphasia  Skin: Skin is warm and dry. No rash noted. There is erythema (With swelling, tenderness, drainage of his right lower extremity).  Psychiatric: He has a normal mood and affect. His behavior is normal. Judgment and thought content normal.    LABORATORY PANEL:   CBC Recent Labs  Lab 09/12/18 2013  WBC 12.0*  HGB 12.7*  HCT 40.8  PLT 189   ------------------------------------------------------------------------------------------------------------------  Chemistries  Recent Labs  Lab  09/12/18 2013  NA 139  K 4.7  CL 103  CO2 27  GLUCOSE 142*  BUN 16  CREATININE 0.86  CALCIUM 8.6*  AST 13*  ALT 9  ALKPHOS 51  BILITOT 0.5   ------------------------------------------------------------------------------------------------------------------  Cardiac Enzymes No results for input(s): TROPONINI in the last 168 hours. ------------------------------------------------------------------------------------------------------------------  RADIOLOGY:  Dg Foot Complete Right  Result  Date: 09/12/2018 CLINICAL DATA:  Cellulitis EXAM: RIGHT FOOT COMPLETE - 3+ VIEW COMPARISON:  None. FINDINGS: Mild osteopenia. No fracture or dislocation. No soft tissue ulceration or emphysema. No osteolysis. IMPRESSION: No radiographic evidence of osteomyelitis. Electronically Signed   By: Ulyses Jarred M.D.   On: 09/12/2018 21:55    EKG:   Orders placed or performed during the hospital encounter of 08/20/15  . ED EKG  . ED EKG  . ED EKG  . ED EKG  . EKG 12-Lead  . EKG 12-Lead    IMPRESSION AND PLAN:  Principal Problem:   Cellulitis in diabetic foot ([place]) -IV vancomycin initiated in the ED, given that the patient is a diabetic I will add IV meropenem as well for Pseudomonas coverage. Active Problems:   Diabetes ([place]) -sliding scale insulin coverage   HTN (hypertension) -continue home dose antihypertensives   Chronic diastolic CHF (congestive heart failure) ([place]) -continue home medications   COPD (chronic obstructive pulmonary disease) ([place]) -continue home dose inhalers   HLD (hyperlipidemia) -continue home dose antilipid  Chart review performed and case discussed with ED provider. Labs, imaging and/or ECG reviewed by provider and discussed with patient/family. Management plans discussed with the patient and/or family.  DVT PROPHYLAXIS: SubQ lovenox   GI PROPHYLAXIS:  None  ADMISSION STATUS: Inpatient     CODE STATUS: Full Code Status History    Date Active Date Inactive Code  Status Order ID Comments User Context   08/20/2015 1657 08/21/2015 1659 Full Code [id number]  Fritzi Mandes, MD Inpatient   04/27/2015 1420 04/28/2015 1418 Full Code [id number]  Theodoro Grist, MD ED      TOTAL TIME TAKING CARE OF THIS PATIENT: 45 minutes.   Ethlyn Daniels 09/12/2018, 10:48 PM  [facility]  Office  [phone]  CC: Primary care physician; System, Pcp Not In  Note:  This document was prepared using Dragon voice recognition software and may include unintentional dictation errors.

## 2018-09-12 NOTE — ED Provider Notes (Signed)
[facility] Emergency Department Provider Note  Time seen: 8:13 PM  I have reviewed the triage vital signs and the nursing notes.   HISTORY  Chief Complaint Cellulitis    HPI Jezreel Justiniano is a 73 y.o. male with a past medical history of asthma, arthritis, CHF, diabetes, hypertension, hyperlipidemia, lower extremity edema/chronic venous stasis who presents to the emergency department for possible infection of the right lower extremity.  According to report per pace physician who sees the patient at his facility and she states the patient has chronic venous stasis with leg wraps, leg wrap was removed today showing significant sloughing of skin redness and drainage of the right lower extremity.  Patient denies any fever, states nausea but denies any vomiting.  Largely negative review of systems otherwise.   Past Medical History:  Diagnosis Date  . Arthritis   . Asthma   . Cancer ([place])   . CHF (congestive heart failure) ([place])   . Diabetes mellitus without complication ([place])   . Gout   . Hyperlipemia   . Hypertension   . Osteoarthritis   . Stroke [facility])     Patient Active Problem List   Diagnosis Date Noted  . Hypoxia 08/20/2015  . Chest pain 04/27/2015    Past Surgical History:  Procedure Laterality Date  . CHOLECYSTECTOMY      Prior to Admission medications   Medication Sig Start Date End Date Taking? Authorizing Provider  acetaminophen (TYLENOL) 650 MG CR tablet Take 1,300 mg by mouth 2 (two) times daily.    [provider]  albuterol (PROVENTIL HFA;VENTOLIN HFA) 108 (90 BASE) MCG/ACT inhaler Inhale 2 puffs into the lungs every 4 (four) hours as needed for wheezing or shortness of breath.    [provider]  albuterol (PROVENTIL) (2.5 MG/3ML) 0.083% nebulizer solution Take 2.5 mg by nebulization 4 (four) times daily as needed for wheezing or shortness of breath.    [provider]  allopurinol (ZYLOPRIM) 100 MG tablet  Take 100 mg by mouth daily.    [provider]  amLODipine-atorvastatin (CADUET) 10-80 MG per tablet Take 1 tablet by mouth at bedtime.    [provider]  ARIPiprazole (ABILIFY) 10 MG tablet Take 10 mg by mouth daily.    [provider]  aspirin EC 81 MG tablet Take 81 mg by mouth daily.    [provider]  bimatoprost (LUMIGAN) 0.01 % SOLN Place 1 drop into both eyes at bedtime.    [provider]  brimonidine-timolol (COMBIGAN) 0.2-0.5 % ophthalmic solution Place 1 drop into both eyes 2 (two) times daily.    [provider]  cadexomer iodine (IODOSORB) 0.9 % gel Apply 1 application topically daily as needed for wound care.    [provider]  divalproex (DEPAKOTE) 250 MG DR tablet Take 750 mg by mouth 2 (two) times daily.    [provider]  fluticasone-salmeterol (ADVAIR HFA) 230-21 MCG/ACT inhaler Inhale 1 puff into the lungs 2 (two) times daily.    [provider]  furosemide (LASIX) 40 MG tablet Take 40 mg by mouth daily.     [provider]  gabapentin (NEURONTIN) 300 MG capsule Take 300 mg by mouth at bedtime.    [provider]  guaifenesin (ROBITUSSIN) 100 MG/5ML syrup Take 100-200 mg by mouth every 4 (four) hours as needed for cough.    [provider]  insulin aspart (NOVOLOG) 100 UNIT/ML injection Inject 10 Units into the skin 3 (three) times  daily before meals.    [provider]  insulin glargine (LANTUS) 100 UNIT/ML injection Inject 35 Units into the skin at bedtime.    [provider]  ipratropium (ATROVENT) 0.03 % nasal spray Place 2 sprays into the nose 3 (three) times daily. 12/06/16 12/06/17  Darel Hong, MD  ketoconazole (NIZORAL) 2 % shampoo Apply 1 application topically 3 (three) times a week.    [provider]  levothyroxine (SYNTHROID, LEVOTHROID) 50 MCG tablet Take 50 mcg by mouth daily before breakfast.    [provider]    lisinopril (PRINIVIL,ZESTRIL) 40 MG tablet Take 40 mg by mouth daily.    [provider]  metoprolol succinate (TOPROL-XL) 25 MG 24 hr tablet Take 25 mg by mouth daily.    [provider]  montelukast (SINGULAIR) 10 MG tablet Take 10 mg by mouth at bedtime.    [provider]  nitroGLYCERIN (NITROSTAT) 0.4 MG SL tablet Place 0.4 mg under the tongue every 5 (five) minutes as needed for chest pain.    [provider]  omeprazole (PRILOSEC) 20 MG capsule Take 20 mg by mouth daily.    [provider]  ondansetron (ZOFRAN-ODT) 4 MG disintegrating tablet Take 4 mg by mouth every 8 (eight) hours as needed for nausea or vomiting.    [provider]  potassium chloride (KLOR-CON) 8 MEQ tablet Take 8 mEq by mouth daily.    [provider]  predniSONE (STERAPRED UNI-PAK 21 TAB) 10 MG (21) TBPK tablet Take 1 tablet (10 mg total) by mouth daily. Take as directed 08/21/15   Epifanio Lesches, MD  tiotropium (SPIRIVA) 18 MCG inhalation capsule Place 18 mcg into inhaler and inhale daily.    [provider]  traMADol (ULTRAM) 50 MG tablet Take 1 tablet (50 mg total) by mouth every 6 (six) hours as needed. 03/18/18   Medina-Vargas, Monina C, NP  venlafaxine XR (EFFEXOR-XR) 150 MG 24 hr capsule Take 150 mg by mouth 2 (two) times daily.    [provider]  Vitamin D, Ergocalciferol, (DRISDOL) 50000 UNITS CAPS capsule Take 50,000 Units by mouth every 30 (thirty) days.    [provider]  Zinc Oxide (DESITIN) 13 % CREA Apply 1 application topically daily as needed (for irritation).    [provider]    Allergies  Allergen Reactions  . Morphine And Related Other (See Comments)    Pt states that this medication put him in a coma.  . Percocet [Oxycodone-Acetaminophen] Other (See Comments)    Pt states that this medication put him in a coma.  . Bactrim [Sulfamethoxazole-Trimethoprim] Other (See Comments)    Reaction:   Unknown   . Benadryl [Diphenhydramine Hcl] Hives  . Keflex [Cephalexin] Hives  . Penicillins Hives and Other (See Comments)    Has patient had a PCN reaction causing immediate rash, facial/tongue/throat swelling, SOB or lightheadedness with hypotension: No Has patient had a PCN reaction causing severe rash involving mucus membranes or skin necrosis: No Has patient had a PCN reaction that required hospitalization No Has patient had a PCN reaction occurring within the last 10 years: No If all of the above answers are "NO", then may proceed with Cephalosporin use.    Family History  Family history unknown: Yes    Social History Social History   Tobacco Use  . Smoking status: Former Research scientist (life sciences)  . Smokeless tobacco: Never Used  Substance Use Topics  . Alcohol use: No  . Drug use: No  Review of Systems Constitutional: Negative for fever. Cardiovascular: Negative for chest pain. Respiratory: Negative for shortness of breath. Gastrointestinal: Negative for abdominal pain.  Negative for vomiting but positive for nausea. Musculoskeletal: Right leg pain 8/10 dull aching pain.  Positive for redness sloughing of the skin pain and drainage to the right lower extremity Skin: Skin sloughing and edema of the right lower extremity with erythema. Neurological: Negative for headache All other ROS negative  ____________________________________________   PHYSICAL EXAM:  VITAL SIGNS: ED Triage Vitals  Enc Vitals Group     BP 09/12/18 2012 (!) 180/81     Pulse Rate 09/12/18 2012 70     Resp --      Temp 09/12/18 2012 98.5 F (36.9 C)     Temp Source 09/12/18 2012 Oral     SpO2 09/12/18 2012 98 %     Weight 09/12/18 2010 297 lb (134.7 kg)     Height 09/12/18 2010 5\' 9"  (1.753 m)     Head Circumference --      Peak Flow --      Pain Score 09/12/18 2008 4     Pain Loc --      Pain Edu? --      Excl. in [place]? --    Constitutional: Alert and oriented. Well appearing and in no  distress. Eyes: Normal exam ENT   Head: Normocephalic and atraumatic.   Mouth/Throat: Mucous membranes are moist. Cardiovascular: Normal rate, regular rhythm.  Respiratory: Normal respiratory effort without tachypnea nor retractions. Breath sounds are clear  Gastrointestinal: Soft and nontender. No distention.   Musculoskeletal: Patient has skin sloughing especially on the dorsal aspect of the foot with significant edema in the leg as well as the foot with mild drainage to the bottom of the foot. Neurologic:  Normal speech and language. No gross focal neurologic deficits Skin: Patient has skin sloughing and drainage to the right lower extremity/foot. Psychiatric: Mood and affect are normal.   ____________________________________________   INITIAL IMPRESSION / ASSESSMENT AND PLAN / ED COURSE  Pertinent labs & imaging results that were available during my care of the patient were reviewed by me and considered in my medical decision making (see chart for details).  Patient presents emergency department for evaluation of right lower extremity.  Patient has a history of chronic venous stasis and peripheral edema, typically has his legs wrapped.  Wrap was removed today per patient physician, exam consistent with cellulitis was sent to the ER for evaluation.  Here the patient has skin sloughing, erythema and pain 8/10 of the right lower extremity especially of the ankle and foot.  Patient is significant edema of the foot, with drainage.  We will check labs, blood cultures, lactic acid.  Given the presentation of the right lower extremity anticipate likely admission to the hospital for IV antibiotics.  We will start on IV vancomycin while awaiting results.  Patient agreeable to plan of care.  White blood cell count of 12.0.  Patient will be admitted to the hospital service for continued IV antibiotics.  Lactate of 1.9.      ____________________________________________   FINAL CLINICAL  IMPRESSION(S) / ED DIAGNOSES  Right lower extremity cellulitis   Harvest Dark, MD 09/12/18 2128

## 2018-09-12 NOTE — ED Triage Notes (Signed)
Pt to ED via EMS from [facility]. Pt has obvious right leg cellulitis with hx of PVD. Pts right leg blistered bright red and weeping green liquid. VSS. NAD.

## 2018-09-12 NOTE — ED Notes (Signed)
Multiple unsuccessful attempts to get 2nd set of blood cultures, okay to start antibiotics after obtaining first set of cultures per md

## 2018-09-13 LAB — BASIC METABOLIC PANEL
Anion gap: 6 (ref 5–15)
BUN: 15 mg/dL (ref 8–23)
CHLORIDE: 105 mmol/L (ref 98–111)
CO2: 30 mmol/L (ref 22–32)
Calcium: 8.3 mg/dL — ABNORMAL LOW (ref 8.9–10.3)
Creatinine, Ser: 0.91 mg/dL (ref 0.61–1.24)
GFR calc Af Amer: >60 mL/min (ref >60)
GFR calc non Af Amer: >60 mL/min (ref >60)
Glucose, Bld: 207 mg/dL — ABNORMAL HIGH (ref 70–99)
Potassium: 3.7 mmol/L (ref 3.5–5.1)
Sodium: 141 mmol/L (ref 135–145)

## 2018-09-13 LAB — GLUCOSE, CAPILLARY
GLUCOSE-CAPILLARY: 129 mg/dL — AB (ref 70–99)
Glucose-Capillary: 91 mg/dL (ref 70–99)
Glucose-Capillary: 93 mg/dL (ref 70–99)
Glucose-Capillary: 112 mg/dL — ABNORMAL HIGH (ref 70–99)
Glucose-Capillary: 128 mg/dL — ABNORMAL HIGH (ref 70–99)

## 2018-09-13 LAB — CBC
HCT: 35.8 % — ABNORMAL LOW (ref 39.0–52.0)
Hemoglobin: 11.3 g/dL — ABNORMAL LOW (ref 13.0–17.0)
MCH: 28.1 pg (ref 26.0–34.0)
MCHC: 31.6 g/dL (ref 30.0–36.0)
MCV: 89.1 fL (ref 80.0–100.0)
Platelets: 176 10*3/uL (ref 150–400)
RBC: 4.02 MIL/uL — ABNORMAL LOW (ref 4.22–5.81)
RDW: 15.7 % — ABNORMAL HIGH (ref 11.5–15.5)
WBC: 9.8 10*3/uL (ref 4.0–10.5)
nRBC: 0 % (ref 0.0–0.2)

## 2018-09-13 LAB — MRSA PCR SCREENING: MRSA BY PCR: POSITIVE — AB

## 2018-09-13 MED ORDER — AMLODIPINE-ATORVASTATIN 10-80 MG PO TABS: 1.0000 | ORAL_TABLET | Freq: Every day | ORAL | Status: DC

## 2018-09-13 MED ORDER — METOPROLOL SUCCINATE ER 25 MG PO TB24
25.0000 mg | ORAL_TABLET | Freq: Every day | ORAL | Status: DC
  Administered 2018-09-13 – 2018-09-16 (×4): 25 mg via ORAL
  Filled 2018-09-13 (×4): qty 1

## 2018-09-13 MED ORDER — AMLODIPINE BESYLATE 10 MG PO TABS
10.0000 mg | ORAL_TABLET | Freq: Every day | ORAL | Status: DC
  Administered 2018-09-13 – 2018-09-15 (×4): 10 mg via ORAL
  Filled 2018-09-13 (×4): qty 1

## 2018-09-13 MED ORDER — MUPIROCIN 2 % EX OINT
1.0000 "application " | TOPICAL_OINTMENT | Freq: Two times a day (BID) | CUTANEOUS | Status: DC
  Administered 2018-09-13 – 2018-09-16 (×7): 1 via NASAL
  Filled 2018-09-13: qty 22

## 2018-09-13 MED ORDER — ENOXAPARIN SODIUM 40 MG/0.4ML ~~LOC~~ SOLN
40.0000 mg | Freq: Two times a day (BID) | SUBCUTANEOUS | Status: DC
  Administered 2018-09-13 – 2018-09-16 (×7): 40 mg via SUBCUTANEOUS
  Filled 2018-09-13 (×7): qty 0.4

## 2018-09-13 MED ORDER — VANCOMYCIN HCL 10 G IV SOLR
1500.0000 mg | Freq: Two times a day (BID) | INTRAVENOUS | Status: DC
  Administered 2018-09-13 – 2018-09-15 (×4): 1500 mg via INTRAVENOUS
  Filled 2018-09-13 (×6): qty 1500

## 2018-09-13 MED ORDER — INSULIN GLARGINE 100 UNIT/ML ~~LOC~~ SOLN
20.0000 [IU] | Freq: Every day | SUBCUTANEOUS | Status: DC
  Administered 2018-09-13 – 2018-09-15 (×4): 20 [IU] via SUBCUTANEOUS
  Filled 2018-09-13 (×5): qty 0.2

## 2018-09-13 MED ORDER — MOMETASONE FURO-FORMOTEROL FUM 200-5 MCG/ACT IN AERO
2.0000 | INHALATION_SPRAY | Freq: Two times a day (BID) | RESPIRATORY_TRACT | Status: DC
  Administered 2018-09-13 – 2018-09-16 (×6): 2 via RESPIRATORY_TRACT
  Filled 2018-09-13 (×2): qty 8.8

## 2018-09-13 MED ORDER — PANTOPRAZOLE SODIUM 40 MG PO TBEC
40.0000 mg | DELAYED_RELEASE_TABLET | Freq: Every day | ORAL | Status: DC
  Administered 2018-09-13 – 2018-09-16 (×4): 40 mg via ORAL
  Filled 2018-09-13 (×4): qty 1

## 2018-09-13 MED ORDER — INSULIN ASPART 100 UNIT/ML ~~LOC~~ SOLN: 0.0000 [IU] | Freq: Every day | SUBCUTANEOUS | Status: DC

## 2018-09-13 MED ORDER — INSULIN ASPART 100 UNIT/ML ~~LOC~~ SOLN
0.0000 [IU] | Freq: Three times a day (TID) | SUBCUTANEOUS | Status: DC
  Administered 2018-09-13 – 2018-09-15 (×3): 1 [IU] via SUBCUTANEOUS
  Administered 2018-09-15: 2 [IU] via SUBCUTANEOUS
  Administered 2018-09-16: 1 [IU] via SUBCUTANEOUS
  Filled 2018-09-13 (×5): qty 1

## 2018-09-13 MED ORDER — ATORVASTATIN CALCIUM 20 MG PO TABS
80.0000 mg | ORAL_TABLET | Freq: Every day | ORAL | Status: DC
  Administered 2018-09-13 – 2018-09-15 (×4): 80 mg via ORAL
  Filled 2018-09-13 (×4): qty 4

## 2018-09-13 MED ORDER — ACETAMINOPHEN 650 MG RE SUPP: 650.0000 mg | Freq: Four times a day (QID) | RECTAL | Status: DC | PRN

## 2018-09-13 MED ORDER — DIPHENHYDRAMINE HCL 12.5 MG/5ML PO ELIX
12.5000 mg | ORAL_SOLUTION | Freq: Once | ORAL | Status: AC
  Administered 2018-09-13: 12.5 mg via ORAL
  Filled 2018-09-13: qty 5

## 2018-09-13 MED ORDER — FUROSEMIDE 40 MG PO TABS
40.0000 mg | ORAL_TABLET | Freq: Every day | ORAL | Status: DC
  Administered 2018-09-13 – 2018-09-16 (×4): 40 mg via ORAL
  Filled 2018-09-13 (×4): qty 1

## 2018-09-13 MED ORDER — SODIUM CHLORIDE 0.9 % IV SOLN
1.0000 g | Freq: Once | INTRAVENOUS | Status: AC
  Administered 2018-09-13: 1 g via INTRAVENOUS
  Filled 2018-09-13: qty 1

## 2018-09-13 MED ORDER — CHLORHEXIDINE GLUCONATE CLOTH 2 % EX PADS: 6.0000 | MEDICATED_PAD | Freq: Every day | CUTANEOUS | Status: DC

## 2018-09-13 MED ORDER — LEVOTHYROXINE SODIUM 50 MCG PO TABS
50.0000 ug | ORAL_TABLET | Freq: Every day | ORAL | Status: DC
  Administered 2018-09-13 – 2018-09-16 (×4): 50 ug via ORAL
  Filled 2018-09-13 (×4): qty 1

## 2018-09-13 MED ORDER — ASPIRIN EC 81 MG PO TBEC
81.0000 mg | DELAYED_RELEASE_TABLET | Freq: Every day | ORAL | Status: DC
  Administered 2018-09-13 – 2018-09-16 (×4): 81 mg via ORAL
  Filled 2018-09-13 (×4): qty 1

## 2018-09-13 MED ORDER — ACETAMINOPHEN 325 MG PO TABS
650.0000 mg | ORAL_TABLET | Freq: Four times a day (QID) | ORAL | Status: DC | PRN
  Administered 2018-09-13 – 2018-09-15 (×6): 650 mg via ORAL
  Filled 2018-09-13 (×7): qty 2

## 2018-09-13 MED ORDER — LISINOPRIL 20 MG PO TABS
40.0000 mg | ORAL_TABLET | Freq: Every day | ORAL | Status: DC
  Administered 2018-09-13 – 2018-09-16 (×4): 40 mg via ORAL
  Filled 2018-09-13 (×4): qty 2

## 2018-09-13 MED ORDER — MONTELUKAST SODIUM 10 MG PO TABS
10.0000 mg | ORAL_TABLET | Freq: Every day | ORAL | Status: DC
  Administered 2018-09-13 – 2018-09-15 (×4): 10 mg via ORAL
  Filled 2018-09-13 (×4): qty 1

## 2018-09-13 MED ORDER — TIOTROPIUM BROMIDE MONOHYDRATE 18 MCG IN CAPS
18.0000 ug | ORAL_CAPSULE | Freq: Every day | RESPIRATORY_TRACT | Status: DC
  Administered 2018-09-13 – 2018-09-16 (×4): 18 ug via RESPIRATORY_TRACT
  Filled 2018-09-13: qty 5

## 2018-09-13 MED ORDER — ONDANSETRON HCL 4 MG PO TABS: 4.0000 mg | ORAL_TABLET | Freq: Four times a day (QID) | ORAL | Status: DC | PRN

## 2018-09-13 MED ORDER — ONDANSETRON HCL 4 MG/2ML IJ SOLN
4.0000 mg | Freq: Four times a day (QID) | INTRAMUSCULAR | Status: DC | PRN
  Administered 2018-09-13 – 2018-09-14 (×2): 4 mg via INTRAVENOUS
  Filled 2018-09-13 (×2): qty 2

## 2018-09-13 NOTE — Evaluation (Signed)
Physical Therapy Evaluation Patient Details Name: Mario Blackburn MRN: [id number] DOB: 12/07/1945 Today's Date: 09/13/2018   History of Present Illness  Pt is a 73 y.o. male presenting to hospital 09/12/18 with R LE pain with erythema and drainage.  Pt admitted with cellulitis.  [facility]ACE representative reporting pt with h/o R UE fx (pt reports June 2019).  PMH includes asthma, arthritis, CHF, DM, htn, HLD, LE edema/chronic venous stasis, CA, stroke.  Clinical Impression  Prior to hospital admission, pt's PACE representative reporting pt was needing 4 assist to perform w/c transfers (they have lifts available at facility as needed).  Pt is currently at [facility] for social respite.  Currently pt is max assist for logrolling to Mario side.  Pt initially agreeable to sit on edge of bed but then refusing anything d/t pain all over and feeling "lazy"; nurse notified.  Pt would benefit from skilled PT to address noted impairments and functional limitations (see below for any additional details).  Upon hospital discharge, recommend pt discharge to [facility] pending pt's willingness to participate.    Follow Up Recommendations SNF    Equipment Recommendations  Wheelchair cushion (measurements PT);Wheelchair (measurements PT)(hoyer lift)    Recommendations for Other Services       Precautions / Restrictions Precautions Precautions: Fall Restrictions Weight Bearing Restrictions: No Other Position/Activity Restrictions: H/o R UE fx 2019 (pt reports minimal use of R UE d/t pain)      Mobility  Bed Mobility Overal bed mobility: Needs Assistance Bed Mobility: Rolling Rolling: Max assist         General bed mobility comments: x2 trials logrolling to Mario side (pt did not use R UE d/t reports of pain since h/o R UE fx and minimal use); pt refused to logroll to R side d/t pain all over  Transfers                 General transfer comment: Pt declined to get OOB d/t pain and feeling  "lazy"  Ambulation/Gait             General Gait Details: Pt has not recently ambulated  Stairs            Wheelchair Mobility    Modified Rankin (Stroke Patients Only)       Balance                                             Pertinent Vitals/Pain Pain Assessment: Faces Faces Pain Scale: Hurts little more Pain Location: R LE Pain Descriptors / Indicators: Tender;Sore Pain Intervention(s): Limited activity within patient's tolerance;Monitored during session;Repositioned(RN notified of pt's pain)    Home Living Family/patient expects to be discharged to:: Skilled nursing facility                 Additional Comments: Pt has been staying at [facility] for social respite.    Prior Function Level of Independence: Needs assistance   Gait / Transfers Assistance Needed: Per PACE representative: pt has required 4 assist for w/c transfers at [facility]; pt reports he has not ambulated recently (since June 2019).     Comments: Active PACE program     Hand Dominance        Extremity/Trunk Assessment   Upper Extremity Assessment Upper Extremity Assessment: (good B hand grip strength; pt refused to perform ROM with B UE's d/t  pain all over)    Lower Extremity Assessment Lower Extremity Assessment: (pt refused to move B LE's d/t pain all over)    Cervical / Trunk Assessment Cervical / Trunk Assessment: Kyphotic  Communication   Communication: HOH  Cognition Arousal/Alertness: Awake/alert Behavior During Therapy: WFL for tasks assessed/performed Overall Cognitive Status: Within Functional Limits for tasks assessed                                        General Comments   Nursing cleared pt for participation in physical therapy.  Pt agreeable to very limited PT session.    Exercises     Assessment/Plan    PT Assessment Patient needs continued PT services  PT Problem List Decreased strength;Decreased  range of motion;Decreased activity tolerance;Decreased balance;Decreased mobility;Decreased knowledge of precautions;Pain;Decreased skin integrity       PT Treatment Interventions DME instruction;Functional mobility training;Therapeutic activities;Therapeutic exercise;Balance training;Patient/family education    PT Goals (Current goals can be found in the Care Plan section)  Acute Rehab PT Goals Patient Stated Goal: to improve strength PT Goal Formulation: With patient Time For Goal Achievement: 09/27/18 Potential to Achieve Goals: Fair    Frequency Min 2X/week   Barriers to discharge        Co-evaluation               AM-PAC PT "6 Clicks" Mobility  Outcome Measure Help needed turning from your back to your side while in a flat bed without using bedrails?: A Lot Help needed moving from lying on your back to sitting on the side of a flat bed without using bedrails?: A Lot Help needed moving to and from a bed to a chair (including a wheelchair)?: Total Help needed standing up from a chair using your arms (e.g., wheelchair or bedside chair)?: Total Help needed to walk in hospital room?: Total Help needed climbing 3-5 steps with a railing? : Total 6 Click Score: 8    End of Session   Activity Tolerance: Patient limited by pain Patient left: in bed;with call bell/phone within reach;with bed alarm set Nurse Communication: Mobility status;Precautions PT Visit Diagnosis: Other abnormalities of gait and mobility (R26.89);Muscle weakness (generalized) (M62.81);Pain Pain - Right/Left: Right Pain - part of body: Leg    Time: 1120-1130 PT Time Calculation (min) (ACUTE ONLY): 10 min   Charges:   PT Evaluation $PT Eval Low Complexity: 1 Low         Lorin Hauck, PT 09/13/18, 1:06 PM [phone]

## 2018-09-13 NOTE — Plan of Care (Signed)
Discussed with patient plan of care for the evening, pain management and mRSA swab with some teach back displayed

## 2018-09-13 NOTE — Plan of Care (Signed)
Discussed with patient plan of care for the evening, pain management and talked about itching and paged md with some teach back displayed

## 2018-09-13 NOTE — Progress Notes (Signed)
[place] [facility] [place] NAME: Mario Blackburn    MR#:  [id number]  DATE OF BIRTH:  29-Nov-1945  SUBJECTIVE:   Patient presented to the hospital due to right foot redness and swelling suspected to have cellulitis.  Afebrile, hemodynamically stable.  Patient is complaining of pain in that right foot.  REVIEW OF SYSTEMS:    Review of Systems  Constitutional: Negative for chills and fever.  HENT: Negative for congestion and tinnitus.   Eyes: Negative for blurred vision and double vision.  Respiratory: Negative for cough, shortness of breath and wheezing.   Cardiovascular: Positive for leg swelling. Negative for chest pain, orthopnea and PND.  Gastrointestinal: Negative for abdominal pain, diarrhea, nausea and vomiting.  Genitourinary: Negative for dysuria and hematuria.  Neurological: Positive for weakness (generalized weakness. ). Negative for dizziness, sensory change and focal weakness.  All other systems reviewed and are negative.   Nutrition: Heart Healthy/Carb control Tolerating Diet: Yes Tolerating PT: AWait Eval.    DRUG ALLERGIES:   Allergies  Allergen Reactions  . Morphine And Related Other (See Comments)    Pt states that this medication put him in a coma.  . Percocet [Oxycodone-Acetaminophen] Other (See Comments)    Pt states that this medication put him in a coma.  . Bactrim [Sulfamethoxazole-Trimethoprim] Other (See Comments)    Reaction:  Unknown   . Benadryl [Diphenhydramine Hcl] Hives  . Keflex [Cephalexin] Hives  . Penicillins Hives and Other (See Comments)    Has patient had a PCN reaction causing immediate rash, facial/tongue/throat swelling, SOB or lightheadedness with hypotension: No Has patient had a PCN reaction causing severe rash involving mucus membranes or skin necrosis: No Has patient had a PCN reaction that required hospitalization No Has patient had a PCN reaction occurring within the last 10 years: No If  all of the above answers are "NO", then may proceed with Cephalosporin use.    VITALS:  Blood pressure (!) 125/59, pulse 67, temperature 98.6 F (37 C), temperature source Oral, resp. rate 19, height 5\' 9"  (1.753 m), weight (!) 137 kg, SpO2 93 %.  PHYSICAL EXAMINATION:   Physical Exam  GENERAL:  73 y.o.-year-old obese patient lying in bed in no acute distress.  EYES: Pupils equal, round, reactive to light and accommodation. No scleral icterus. Extraocular muscles intact.  HEENT: Head atraumatic, normocephalic. Oropharynx and nasopharynx clear.  NECK:  Supple, no jugular venous distention. No thyroid enlargement, no tenderness.  LUNGS: Normal breath sounds bilaterally, no wheezing, rales, rhonchi. No use of accessory muscles of respiration.  CARDIOVASCULAR: S1, S2 normal. No murmurs, rubs, or gallops.  ABDOMEN: Soft, nontender, nondistended. Bowel sounds present. No organomegaly or mass.  EXTREMITIES: No cyanosis, clubbing, +1-2 pitting edema bilaterally.  Signs of chronic venous stasis bilaterally.  Right foot erythema and slight warmth from superficial skin tears. NEUROLOGIC: Cranial nerves II through XII are intact. No focal Motor or sensory deficits b/l. Globally weak.   PSYCHIATRIC: The patient is alert and oriented x 3.  SKIN: No obvious rash, lesion, or ulcer.    LABORATORY PANEL:   CBC Recent Labs  Lab 09/13/18 0353  WBC 9.8  HGB 11.3*  HCT 35.8*  PLT 176   ------------------------------------------------------------------------------------------------------------------  Chemistries  Recent Labs  Lab 09/12/18 2013 09/13/18 0353  NA 139 141  K 4.7 3.7  CL 103 105  CO2 27 30  GLUCOSE 142* 207*  BUN 16 15  CREATININE 0.86 0.91  CALCIUM 8.6* 8.3*  AST 13*  --   ALT 9  --   ALKPHOS 51  --   BILITOT 0.5  --    ------------------------------------------------------------------------------------------------------------------  Cardiac Enzymes No results for  input(s): TROPONINI in the last 168 hours. ------------------------------------------------------------------------------------------------------------------  RADIOLOGY:  Dg Foot Complete Right  Result Date: 09/12/2018 CLINICAL DATA:  Cellulitis EXAM: RIGHT FOOT COMPLETE - 3+ VIEW COMPARISON:  None. FINDINGS: Mild osteopenia. No fracture or dislocation. No soft tissue ulceration or emphysema. No osteolysis. IMPRESSION: No radiographic evidence of osteomyelitis. Electronically Signed   By: Ulyses Jarred M.D.   On: 09/12/2018 21:55     ASSESSMENT AND PLAN:   73 year old male with past medical history of previous CVA, osteoarthritis, hypertension, hyperlipidemia, gout, CHF, diabetes, obstructive sleep apnea, who presented to the hospital due to right foot pain swelling and redness.  1.  Right foot redness swelling and pain- suspected to be secondary to mild cellulitis. - Clinically patient is afebrile and hemodynamically stable with no white cell count. - Some of his skin changes could be related to chronic venous stasis and he needs more local wound care.  I will consult wound team. -Continue IV vancomycin empirically for now, DC meropenem.  Follow clinically.  2 diabetes type 2 without complication- continue Lantus, sliding scale insulin.  3.  Essential hypertension-continue Norvasc, lisinopril, Toprol. -Blood pressure stable.  4.  COPD-no acute exacerbation.  Continue Dulera, Spiriva.  5.  GERD-continue Protonix.  6.  Hypothyroidism-continue Synthroid.  7. Hx of chronic diastolic CHF - cont. Toprol, Lasix.   Await PT eval.   All the records are reviewed and case discussed with Care Management/Social Worker. Management plans discussed with the patient, family and they are in agreement.  CODE STATUS: Full code  DVT Prophylaxis: Lovenox  TOTAL TIME TAKING CARE OF THIS PATIENT: 30 minutes.   POSSIBLE D/C IN 1-2 DAYS, DEPENDING ON CLINICAL CONDITION.   Henreitta Leber M.D on  09/13/2018 at 11:40 AM  Between 7am to 6pm - Pager - [phone]  After 6pm go to [email or web address] - Proofreader  [employer] [facility]  Office  [phone]  CC: Primary care physician; System, Pcp Not In

## 2018-09-13 NOTE — Clinical Social Work Note (Signed)
Clinical Social Work Assessment  Patient Details  Name: Mario Blackburn MRN: [id number] Date of Birth: 1946-02-27  Date of referral:  09/13/18               Reason for consult:  Other (Comment Required)(From [facility] )                Permission sought to share information with:    Permission granted to share information::     Name::        Agency::     Relationship::     Contact Information:     Housing/Transportation Living arrangements for the past 2 months:  [place] of Information:  Facility, Other (Comment Required)([facility] ) Patient Interpreter Needed:  None Criminal Activity/Legal Involvement Pertinent to Current Situation/Hospitalization:  No - Comment as needed Significant Relationships:  Spouse, Adult Children Lives with:  Facility Resident Do you feel safe going back to the place where you live?    Need for family participation in patient care:  Yes (Comment)  Care giving concerns:  Patient is at [facility] for respite care under [facility].    Social Worker assessment / plan:  Holiday representative ([place]) reviewed chart and noted that patient is from [facility]. Per Neoma Laming admissions coordinator at [facility] patient is there for respite care under a [facility] contract. Per Neoma Laming patient can return to [facility] when stable. CSW attempted to meet with patient however he was not alert and oriented and no family was at bedside. CSW attempted to contact patient's wife [name] and daughter Maudie [place] however they did not answer and voicemails could not be left. CSW spoke with Cat and Juliann Pulse from [facility]. Per PACE staff plan is for patient to return to [facility] from [facility]. FL2 complete. CSW will continue to follow and assist as needed.   Employment status:  Disabled (Comment on whether or not currently receiving Disability), Retired Forensic scientist:  Other (Comment Required)([facility] ) PT Recommendations:  Not assessed at this  time Information / Referral to community resources:  [place]  Patient/Family's Response to care:  Per PACE plan is for patient to D/C back to [facility]   Patient/Family's Understanding of and Emotional Response to Diagnosis, Current Treatment, and Prognosis:  Patient was pleasantly confused and CSW was not able to reach his wife or daughter via telephone.   Emotional Assessment Appearance:  Appears stated age Attitude/Demeanor/Rapport:  Unable to Assess Affect (typically observed):  Unable to Assess Orientation:  Oriented to Self, Fluctuating Orientation (Suspected and/or reported Sundowners) Alcohol / Substance use:  Not Applicable Psych involvement (Current and /or in the community):  No (Comment)  Discharge Needs  Concerns to be addressed:  Discharge Planning Concerns Readmission within the last 30 days:  No Current discharge risk:  Dependent with Mobility, Cognitively Impaired, Chronically ill Barriers to Discharge:  Continued Medical Work up   [employer], Veronia Beets, LCSW 09/13/2018, 1:37 PM

## 2018-09-13 NOTE — NC FL2 (Signed)
[place] LEVEL OF CARE SCREENING TOOL     IDENTIFICATION  Patient Name: Mario Blackburn Birthdate: 1945-10-06 Sex: male Admission Date (Current Location): 09/12/2018  [place] and [place] Number:  Engineering geologist and Address:  [facility], [address], [place], [place] [address]      Provider Number: [id number]  Attending Physician Name and Address:  Henreitta Leber, MD  Relative Name and Phone Number:       Current Level of Care: Hospital Recommended Level of Care: [place] Prior Approval Number:    Date Approved/Denied:   PASRR Number: [id number])  Discharge Plan: SNF    Current Diagnoses: Patient Active Problem List   Diagnosis Date Noted  . Diabetes ([place]) 09/12/2018  . HTN (hypertension) 09/12/2018  . HLD (hyperlipidemia) 09/12/2018  . Chronic diastolic CHF (congestive heart failure) ([place]) 09/12/2018  . COPD (chronic obstructive pulmonary disease) ([place]) 09/12/2018  . Cellulitis in diabetic foot ([place]) 09/12/2018  . Hypoxia 08/20/2015  . Chest pain 04/27/2015    Orientation RESPIRATION BLADDER Height & Weight     Self  Normal Incontinent Weight: (!) 302 lb 0.5 oz (137 kg) Height:  5\' 9"  (175.3 cm)  BEHAVIORAL SYMPTOMS/MOOD NEUROLOGICAL BOWEL NUTRITION STATUS      Incontinent Diet(Diet: Heart Healthy/ Carb Modified. )  AMBULATORY STATUS COMMUNICATION OF NEEDS Skin   Extensive Assist Verbally Other (Comment)(right foot redness and swelling )                       Personal Care Assistance Level of Assistance  Bathing, Feeding, Dressing Bathing Assistance: Limited assistance Feeding assistance: Independent Dressing Assistance: Limited assistance     Functional Limitations Info  Sight, Hearing, Speech Sight Info: Impaired Hearing Info: Impaired Speech Info: Adequate    SPECIAL CARE FACTORS FREQUENCY  PT (By licensed PT), OT (By licensed OT)     PT Frequency: (5) OT Frequency: (5)            Contractures      Additional Factors Info  Code Status, Allergies, Isolation Precautions Code Status Info: (Full Code. ) Allergies Info: (Morphine And Related, Percocet Oxycodone- acetaminophen, Bactrim Sulfamethoxazole-trimethoprim, Benadryl Diphenhydramine Hcl, Keflex Cephalexin, Penicillins)     Isolation Precautions Info: (MRSA Nasal Swab. )     Current Medications (09/13/2018):  This is the current hospital active medication list Current Facility-Administered Medications  Medication Dose Route Frequency Provider Last Rate Last Dose  . acetaminophen (TYLENOL) tablet 650 mg  650 mg Oral Q6H PRN Lance Coon, MD   650 mg at 09/13/18 1240   Or  . acetaminophen (TYLENOL) suppository 650 mg  650 mg Rectal Q6H PRN Lance Coon, MD      . amLODipine (NORVASC) tablet 10 mg  10 mg Oral Corwin Levins, MD   10 mg at 09/13/18 0202   And  . atorvastatin (LIPITOR) tablet 80 mg  80 mg Oral Corwin Levins, MD   80 mg at 09/13/18 0202  . aspirin EC tablet 81 mg  81 mg Oral Daily Lance Coon, MD   81 mg at 09/13/18 0831  . [START ON 09/14/2018] Chlorhexidine Gluconate Cloth 2 % PADS 6 each  6 each Topical Q0600 Lance Coon, MD      . enoxaparin (LOVENOX) injection 40 mg  40 mg Subcutaneous BID Lance Coon, MD   40 mg at 09/13/18 0830  . furosemide (LASIX) tablet 40 mg  40 mg Oral Daily Lance Coon, MD  40 mg at 09/13/18 0831  . insulin aspart (novoLOG) injection 0-5 Units  0-5 Units Subcutaneous QHS Lance Coon, MD      . insulin aspart (novoLOG) injection 0-9 Units  0-9 Units Subcutaneous TID WC Lance Coon, MD   1 Units at 09/13/18 1235  . insulin glargine (LANTUS) injection 20 Units  20 Units Subcutaneous QHS Lance Coon, MD   20 Units at 09/13/18 0202  . levothyroxine (SYNTHROID, LEVOTHROID) tablet 50 mcg  50 mcg Oral QAC breakfast Lance Coon, MD   50 mcg at 09/13/18 0830  . lisinopril (PRINIVIL,ZESTRIL) tablet 40 mg  40 mg Oral Daily Lance Coon, MD   40  mg at 09/13/18 0831  . metoprolol succinate (TOPROL-XL) 24 hr tablet 25 mg  25 mg Oral Daily Lance Coon, MD   25 mg at 09/13/18 0831  . mometasone-formoterol (DULERA) 200-5 MCG/ACT inhaler 2 puff  2 puff Inhalation BID Lance Coon, MD   2 puff at 09/13/18 0830  . montelukast (SINGULAIR) tablet 10 mg  10 mg Oral Corwin Levins, MD   10 mg at 09/13/18 0202  . mupirocin ointment (BACTROBAN) 2 % 1 application  1 application Nasal BID Lance Coon, MD   1 application at 93/26/71 [phone]  . ondansetron (ZOFRAN) tablet 4 mg  4 mg Oral Q6H PRN Lance Coon, MD       Or  . ondansetron [facility]) injection 4 mg  4 mg Intravenous Q6H PRN Lance Coon, MD      . pantoprazole (PROTONIX) EC tablet 40 mg  40 mg Oral Daily Lance Coon, MD   40 mg at 09/13/18 0830  . tiotropium (SPIRIVA) inhalation capsule ([facility] use ONLY) 18 mcg  18 mcg Inhalation Daily Lance Coon, MD   18 mcg at 09/13/18 0830  . vancomycin (VANCOCIN) 1,500 mg in sodium chloride 0.9 % 500 mL IVPB  1,500 mg Intravenous Q12H Lance Coon, MD         Discharge Medications: Please see discharge summary for a list of discharge medications.  Relevant Imaging Results:  Relevant Lab Results:   Additional Information (SSN: [id number])  Jerriah Ines, Veronia Beets, LCSW

## 2018-09-13 NOTE — Progress Notes (Signed)
Pharmacy Antibiotic Note  Mario Blackburn is a 73 y.o. male admitted on 09/12/2018 with cellulitis.  Pharmacy has been consulted for vancomycin and meropenem dosing.  Plan: DW 135 kg  Vd 88 L kei 0.070 hr-1  T1/2 10 hours  Vancomycin 1500 mg IV Q 12 hrs. Goal AUC 400-550. Expected AUC: 490 SCr used: 0.86   Height: 5\' 9"  (175.3 cm) Weight: (!) 302 lb (137 kg) IBW/kg (Calculated) : 70.7  Temp (24hrs), Avg:98.5 F (36.9 C), Min:98.5 F (36.9 C), Max:98.5 F (36.9 C)  Recent Labs  Lab 09/12/18 2013 09/12/18 2234  WBC 12.0*  --   CREATININE 0.86  --   LATICACIDVEN 1.9 1.9    Estimated Creatinine Clearance: 108.3 mL/min (by C-G formula based on SCr of 0.86 mg/dL).    Allergies  Allergen Reactions  . Morphine And Related Other (See Comments)    Pt states that this medication put him in a coma.  . Percocet [Oxycodone-Acetaminophen] Other (See Comments)    Pt states that this medication put him in a coma.  . Bactrim [Sulfamethoxazole-Trimethoprim] Other (See Comments)    Reaction:  Unknown   . Benadryl [Diphenhydramine Hcl] Hives  . Keflex [Cephalexin] Hives  . Penicillins Hives and Other (See Comments)    Has patient had a PCN reaction causing immediate rash, facial/tongue/throat swelling, SOB or lightheadedness with hypotension: No Has patient had a PCN reaction causing severe rash involving mucus membranes or skin necrosis: No Has patient had a PCN reaction that required hospitalization No Has patient had a PCN reaction occurring within the last 10 years: No If all of the above answers are "NO", then may proceed with Cephalosporin use.    Antimicrobials this admission: Vancomycin, meropenem 1/27  >>    >>   Dose adjustments this admission:   Microbiology results: 1/27 BCx: pending 1/27 MRSA PCR: pending  Thank you for allowing pharmacy to be a part of this patient's care.  Athalia Setterlund S 09/13/2018 2:21 AM

## 2018-09-13 NOTE — Progress Notes (Signed)
Lovenox changed to 40 mg BID for BMI >40 and CrCl >30. 

## 2018-09-14 DIAGNOSIS — L03115 Cellulitis of right lower limb: Principal | ICD-10-CM

## 2018-09-14 DIAGNOSIS — E119 Type 2 diabetes mellitus without complications: Secondary | ICD-10-CM

## 2018-09-14 DIAGNOSIS — I739 Peripheral vascular disease, unspecified: Secondary | ICD-10-CM

## 2018-09-14 DIAGNOSIS — I10 Essential (primary) hypertension: Secondary | ICD-10-CM

## 2018-09-14 DIAGNOSIS — E785 Hyperlipidemia, unspecified: Secondary | ICD-10-CM

## 2018-09-14 LAB — BASIC METABOLIC PANEL
Anion gap: 5 (ref 5–15)
BUN: 15 mg/dL (ref 8–23)
CO2: 29 mmol/L (ref 22–32)
Calcium: 8.3 mg/dL — ABNORMAL LOW (ref 8.9–10.3)
Chloride: 106 mmol/L (ref 98–111)
Creatinine, Ser: 0.85 mg/dL (ref 0.61–1.24)
GFR calc Af Amer: >60 mL/min (ref >60)
GFR calc non Af Amer: >60 mL/min (ref >60)
Glucose, Bld: 96 mg/dL (ref 70–99)
POTASSIUM: 3.7 mmol/L (ref 3.5–5.1)
Sodium: 140 mmol/L (ref 135–145)

## 2018-09-14 LAB — GLUCOSE, CAPILLARY
GLUCOSE-CAPILLARY: 195 mg/dL — AB (ref 70–99)
Glucose-Capillary: 99 mg/dL (ref 70–99)
Glucose-Capillary: 145 mg/dL — ABNORMAL HIGH (ref 70–99)
Glucose-Capillary: 113 mg/dL — ABNORMAL HIGH (ref 70–99)

## 2018-09-14 LAB — CBC
HCT: 34.2 % — ABNORMAL LOW (ref 39.0–52.0)
Hemoglobin: 10.9 g/dL — ABNORMAL LOW (ref 13.0–17.0)
MCH: 28.9 pg (ref 26.0–34.0)
MCHC: 31.9 g/dL (ref 30.0–36.0)
MCV: 90.7 fL (ref 80.0–100.0)
Platelets: 167 10*3/uL (ref 150–400)
RBC: 3.77 MIL/uL — ABNORMAL LOW (ref 4.22–5.81)
RDW: 15.7 % — ABNORMAL HIGH (ref 11.5–15.5)
WBC: 10.2 10*3/uL (ref 4.0–10.5)
nRBC: 0 % (ref 0.0–0.2)

## 2018-09-14 MED ORDER — VENLAFAXINE HCL ER 75 MG PO CP24
225.0000 mg | ORAL_CAPSULE | Freq: Every day | ORAL | Status: DC
  Administered 2018-09-14 – 2018-09-16 (×3): 225 mg via ORAL
  Filled 2018-09-14 (×3): qty 1

## 2018-09-14 MED ORDER — BRIMONIDINE TARTRATE 0.2 % OP SOLN
1.0000 [drp] | Freq: Two times a day (BID) | OPHTHALMIC | Status: DC
  Administered 2018-09-14 – 2018-09-16 (×5): 1 [drp] via OPHTHALMIC
  Filled 2018-09-14: qty 5

## 2018-09-14 MED ORDER — LATANOPROST 0.005 % OP SOLN
1.0000 [drp] | Freq: Every day | OPHTHALMIC | Status: DC
  Administered 2018-09-16: 1 [drp] via OPHTHALMIC
  Filled 2018-09-14 (×2): qty 2.5

## 2018-09-14 MED ORDER — CHLORHEXIDINE GLUCONATE CLOTH 2 % EX PADS
6.0000 | MEDICATED_PAD | Freq: Every day | CUTANEOUS | Status: DC
  Administered 2018-09-14 – 2018-09-16 (×3): 6 via TOPICAL

## 2018-09-14 MED ORDER — BRIMONIDINE TARTRATE-TIMOLOL 0.2-0.5 % OP SOLN
1.0000 [drp] | Freq: Two times a day (BID) | OPHTHALMIC | Status: DC
  Filled 2018-09-14: qty 5

## 2018-09-14 MED ORDER — TIMOLOL MALEATE 0.5 % OP SOLN
1.0000 [drp] | Freq: Two times a day (BID) | OPHTHALMIC | Status: DC
  Administered 2018-09-14 – 2018-09-16 (×5): 1 [drp] via OPHTHALMIC
  Filled 2018-09-14: qty 5

## 2018-09-14 MED ORDER — ARTIFICIAL TEARS OPHTHALMIC OINT
1.0000 "application " | TOPICAL_OINTMENT | OPHTHALMIC | Status: DC | PRN
  Administered 2018-09-14: 1 via OPHTHALMIC
  Filled 2018-09-14: qty 3.5

## 2018-09-14 MED ORDER — ARIPIPRAZOLE 5 MG PO TABS
5.0000 mg | ORAL_TABLET | Freq: Every day | ORAL | Status: DC
  Administered 2018-09-14 – 2018-09-16 (×3): 5 mg via ORAL
  Filled 2018-09-14 (×3): qty 1

## 2018-09-14 MED ORDER — DIVALPROEX SODIUM ER 500 MG PO TB24
1000.0000 mg | ORAL_TABLET | Freq: Two times a day (BID) | ORAL | Status: DC
  Administered 2018-09-14 – 2018-09-16 (×5): 1000 mg via ORAL
  Filled 2018-09-14 (×6): qty 2

## 2018-09-14 MED ORDER — HYDROXYZINE HCL 25 MG PO TABS
25.0000 mg | ORAL_TABLET | Freq: Three times a day (TID) | ORAL | Status: DC | PRN
  Administered 2018-09-14 – 2018-09-15 (×3): 25 mg via ORAL
  Filled 2018-09-14 (×4): qty 1

## 2018-09-14 NOTE — Progress Notes (Signed)
[facility] [place] NAME: Mario Blackburn    MR#:  [id number]  DATE OF BIRTH:  1946-04-08  SUBJECTIVE:   Patient presented to the hospital due to right foot redness and swelling suspected to have cellulitis.   No other acute events overnight, remains afebrile.  Seen by wound care this afternoon. Complaining of some dry itchy eyes and also itching all over body.   REVIEW OF SYSTEMS:    Review of Systems  Constitutional: Negative for chills and fever.  HENT: Negative for congestion and tinnitus.   Eyes: Positive for blurred vision. Negative for double vision.  Respiratory: Negative for cough, shortness of breath and wheezing.   Cardiovascular: Positive for leg swelling. Negative for chest pain, orthopnea and PND.  Gastrointestinal: Negative for abdominal pain, diarrhea, nausea and vomiting.  Genitourinary: Negative for dysuria and hematuria.  Skin: Positive for itching.  Neurological: Positive for weakness (generalized weakness. ). Negative for dizziness, sensory change and focal weakness.  All other systems reviewed and are negative.   Nutrition: Heart Healthy/Carb control Tolerating Diet: Yes Tolerating PT: Await Eval.    DRUG ALLERGIES:   Allergies  Allergen Reactions  . Morphine And Related Other (See Comments)    Pt states that this medication put him in a coma.  . Percocet [Oxycodone-Acetaminophen] Other (See Comments)    Pt states that this medication put him in a coma.  . Bactrim [Sulfamethoxazole-Trimethoprim] Other (See Comments)    Reaction:  Unknown   . Benadryl [Diphenhydramine Hcl] Hives  . Keflex [Cephalexin] Hives  . Penicillins Hives and Other (See Comments)    Has patient had a PCN reaction causing immediate rash, facial/tongue/throat swelling, SOB or lightheadedness with hypotension: No Has patient had a PCN reaction causing severe rash involving mucus membranes or skin necrosis: No Has patient had a PCN  reaction that required hospitalization No Has patient had a PCN reaction occurring within the last 10 years: No If all of the above answers are "NO", then may proceed with Cephalosporin use.    VITALS:  Blood pressure 134/64, pulse 96, temperature 98.5 F (36.9 C), temperature source Oral, resp. rate 19, height 5\' 9"  (1.753 m), weight (!) 137 kg, SpO2 96 %.  PHYSICAL EXAMINATION:   Physical Exam  GENERAL:  73 y.o.-year-old obese patient lying in bed in no acute distress.  EYES: Pupils equal, round, reactive to light and accommodation. No scleral icterus. Extraocular muscles intact.  HEENT: Head atraumatic, normocephalic. Oropharynx and nasopharynx clear.  NECK:  Supple, no jugular venous distention. No thyroid enlargement, no tenderness.  LUNGS: Normal breath sounds bilaterally, no wheezing, rales, rhonchi. No use of accessory muscles of respiration.  CARDIOVASCULAR: S1, S2 normal. No murmurs, rubs, or gallops.  ABDOMEN: Soft, nontender, nondistended. Bowel sounds present. No organomegaly or mass.  EXTREMITIES: No cyanosis, clubbing, +1-2 pitting edema bilaterally.  Signs of chronic venous stasis bilaterally.  Right foot erythema and slight warmth from superficial skin sloughing off.   NEUROLOGIC: Cranial nerves II through XII are intact. No focal Motor or sensory deficits b/l. Globally weak.   PSYCHIATRIC: The patient is alert and oriented x 3.  SKIN: No obvious rash, lesion, or ulcer.    LABORATORY PANEL:   CBC Recent Labs  Lab 09/14/18 0314  WBC 10.2  HGB 10.9*  HCT 34.2*  PLT 167   ------------------------------------------------------------------------------------------------------------------  Chemistries  Recent Labs  Lab 09/12/18 2013  09/14/18 0314  NA 139   < > 140  K 4.7   < > 3.7  CL 103   < > 106  CO2 27   < > 29  GLUCOSE 142*   < > 96  BUN 16   < > 15  CREATININE 0.86   < > 0.85  CALCIUM 8.6*   < > 8.3*  AST 13*  --   --   ALT 9  --   --   ALKPHOS  51  --   --   BILITOT 0.5  --   --    < > = values in this interval not displayed.   ------------------------------------------------------------------------------------------------------------------  Cardiac Enzymes No results for input(s): TROPONINI in the last 168 hours. ------------------------------------------------------------------------------------------------------------------  RADIOLOGY:  Dg Foot Complete Right  Result Date: 09/12/2018 CLINICAL DATA:  Cellulitis EXAM: RIGHT FOOT COMPLETE - 3+ VIEW COMPARISON:  None. FINDINGS: Mild osteopenia. No fracture or dislocation. No soft tissue ulceration or emphysema. No osteolysis. IMPRESSION: No radiographic evidence of osteomyelitis. Electronically Signed   By: Ulyses Jarred M.D.   On: 09/12/2018 21:55     ASSESSMENT AND PLAN:   73 year old male with past medical history of previous CVA, osteoarthritis, hypertension, hyperlipidemia, gout, CHF, diabetes, obstructive sleep apnea, who presented to the hospital due to right foot pain swelling and redness.  1.  Right foot redness swelling and pain- due to chronic stasis with mild superinfected cellulitis. - Clinically patient is afebrile and hemodynamically stable with no white cell count. - Cruciate wound team consult and continue local wound care for now.  Continue IV vancomycin  2 diabetes type 2 without complication- continue Lantus, sliding scale insulin.  3.  Essential hypertension-continue Norvasc, lisinopril, Toprol. -Blood pressure stable.  4.  COPD-no acute exacerbation.  Continue Dulera, Spiriva.  5.  GERD-continue Protonix.  6.  Hypothyroidism-continue Synthroid.  7. Hx of chronic diastolic CHF - cont. Toprol, Lasix.   Appreciate PT and pt. Is long term patient and [facility] and will discharge there tomorrow.   All the records are reviewed and case discussed with Care Management/Social Worker. Management plans discussed with the patient, family and they are  in agreement.  CODE STATUS: Full code  DVT Prophylaxis: Lovenox  TOTAL TIME TAKING CARE OF THIS PATIENT: 30 minutes.   POSSIBLE D/C IN 1-2 DAYS, DEPENDING ON CLINICAL CONDITION.   Henreitta Leber M.D on 09/14/2018 at 1:04 PM  Between 7am to 6pm - Pager - [phone]  After 6pm go to [email or web address] - Proofreader  [facility]  Office  [phone]  CC: Primary care physician; System, Pcp Not In

## 2018-09-14 NOTE — Consult Note (Signed)
[place] Nurse wound consult note Patient receiving care in [facility].  No family present at the time of my evaluation. Reason for Consult: Right foot cellulitis, weeping Wound type: It is presumed this is venous stasis according to Dr. Edward Jolly note from 09/13/18 at 11:40 a.m. "Some of his skin changes could be related to chronic venous stasis and he needs more local wound care." Measurement: See photo from 09/12/18 at 20:18.  At the time of my assessment the entire right foot is discolored, warm, I cannot palpate a dorsalis or posterior tibial pulse.  The foot is red, maroon, and purple.  Much of the discoloration does not blanche.  There are fissures along the dorsal surface some of which have weeping.  The lower leg has heavy amounts of dried, flaking, peeling skin.  The patient tells me the staff at the facility in which he resides placed a compressive wrap on it that they changed weekly.   I am concerned about blood flow to the foot and lower leg, and I do not support placing a compression wrap on the extremity without blood flow studies to rule out a DVT and to determine an ABI. My overarching concern is that this patient is at high risk for amputation of the limb. Dressing procedure/placement/frequency: Wash the right lower extremity with warm, soapy water. Pat dry.  Apply a heavy layer of moisturizing ointment over the entire lower leg.  Place as many Xeroform gauze Kellie Simmering # [id number]) as necessary to cover the foot and fissures.  Place ABD pads over these and wrap kerlex around the foot to just above the ankle to secure these dressings in place.  Change daily. Monitor the wound area(s) for worsening of condition such as: Signs/symptoms of infection,  Increase in size,  Development of or worsening of odor, Development of pain, or increased pain at the affected locations.  Notify the medical team if any of these develop.  Thank you for the consult.  Discussed plan of care with the patient and bedside nurse.   [place] nurse will not follow at this time.  Please re-consult the [facility] team if needed.  Val Riles, RN, MSN, CWOCN, CNS-BC, pager [phone]

## 2018-09-14 NOTE — Progress Notes (Signed)
   09/14/18 1300  Clinical Encounter Type  Visited With Patient  Visit Type Initial  Referral From Nurse  Stress Factors  Patient Stress Factors Health changes   Chaplain received an OR to visit this patient in the wake of continuous calls to the nurses' station for assistance and/or attention. Patient was lying in bed with his eyes closed, but stirring. Easily aroused with verbal prompt. Patient indicated that he was cold and requested another blanket; chaplain obliged. Upon return to the room, the patient was sleeping soundly so unable to continue the visit.

## 2018-09-14 NOTE — Consult Note (Signed)
[facility] Vascular Consult Note  MRN : [id number]  Jazziel Fitzsimmons is a 73 y.o. (1946/01/03) male who presents with chief complaint of  Chief Complaint  Patient presents with  . Cellulitis   History of Present Illness:  The patient is a 73 year old male with a past medical history of diabetes, hypertension, hyperlipidemia, chronic diastolic congestive heart failure, COPD, history of CVA, osteoarthritis, asthma, lymphedema/chronic venous insufficiency who presented to the [facility] emergency department via EMS from [facility].  The patient presented with a chief complaint of "right leg redness and pain".   The patient is a poor historian and is unable to provide an appropriate history.  Most of the information for this consult was obtained from previous epic notes.  The patient does have a known history of lymphedema/chronic venous stasis which has been treated with Unna wraps.  Apparently, his Unna wraps were removed today at his nursing facility and he was found to have significant sloughing of the skin, erythema and increased drainage to the right lower extremity.  Patient does note increased discomfort to the right lower extremity.  The patient states his legs have been "swelling" for "years".  Patient denies any fever, nausea vomiting.  Patient was seen by the wound care nurse who is concerned that there may be a contributing component of peripheral artery disease.  Vascular surgery was consulted by Dr. Verdell Carmine for further recommendations.  Current Facility-Administered Medications  Medication Dose Route Frequency Provider Last Rate Last Dose  . acetaminophen (TYLENOL) tablet 650 mg  650 mg Oral Q6H PRN Lance Coon, MD   650 mg at 09/14/18 3335   Or  . acetaminophen (TYLENOL) suppository 650 mg  650 mg Rectal Q6H PRN Lance Coon, MD      . amLODipine (NORVASC) tablet 10 mg  10 mg Oral Corwin Levins, MD   10 mg at 09/13/18  2024   And  . atorvastatin (LIPITOR) tablet 80 mg  80 mg Oral Corwin Levins, MD   80 mg at 09/13/18 2024  . ARIPiprazole (ABILIFY) tablet 5 mg  5 mg Oral Daily Henreitta Leber, MD   5 mg at 09/14/18 1319  . artificial tears (LACRILUBE) ophthalmic ointment 1 application  1 application Both Eyes K5G PRN Henreitta Leber, MD   1 application at 25/63/89 1303  . aspirin EC tablet 81 mg  81 mg Oral Daily Lance Coon, MD   81 mg at 09/14/18 0905  . brimonidine (ALPHAGAN) 0.2 % ophthalmic solution 1 drop  1 drop Both Eyes BID Henreitta Leber, MD   1 drop at 09/14/18 1318   And  . timolol (TIMOPTIC) 0.5 % ophthalmic solution 1 drop  1 drop Both Eyes BID Henreitta Leber, MD   1 drop at 09/14/18 1318  . Chlorhexidine Gluconate Cloth 2 % PADS 6 each  6 each Topical Q0600 Henreitta Leber, MD   6 each at 09/14/18 [phone]  . divalproex (DEPAKOTE ER) 24 hr tablet 1,000 mg  1,000 mg Oral BID Henreitta Leber, MD   1,000 mg at 09/14/18 1300  . enoxaparin (LOVENOX) injection 40 mg  40 mg Subcutaneous BID Lance Coon, MD   40 mg at 09/14/18 0914  . furosemide (LASIX) tablet 40 mg  40 mg Oral Daily Lance Coon, MD   40 mg at 09/14/18 0905  . hydrOXYzine (ATARAX/VISTARIL) tablet 25 mg  25 mg Oral TID PRN Henreitta Leber, MD   25  mg at 09/14/18 1301  . insulin aspart (novoLOG) injection 0-5 Units  0-5 Units Subcutaneous QHS Lance Coon, MD      . insulin aspart (novoLOG) injection 0-9 Units  0-9 Units Subcutaneous TID WC Lance Coon, MD   1 Units at 09/14/18 1259  . insulin glargine (LANTUS) injection 20 Units  20 Units Subcutaneous QHS Lance Coon, MD   20 Units at 09/13/18 2019  . latanoprost (XALATAN) 0.005 % ophthalmic solution 1 drop  1 drop Both Eyes QHS Sainani, Belia Heman, MD      . levothyroxine (SYNTHROID, LEVOTHROID) tablet 50 mcg  50 mcg Oral QAC breakfast Lance Coon, MD   50 mcg at 09/14/18 0905  . lisinopril (PRINIVIL,ZESTRIL) tablet 40 mg  40 mg Oral Daily Lance Coon, MD   40 mg at  09/14/18 0913  . metoprolol succinate (TOPROL-XL) 24 hr tablet 25 mg  25 mg Oral Daily Lance Coon, MD   25 mg at 09/14/18 0905  . mometasone-formoterol (DULERA) 200-5 MCG/ACT inhaler 2 puff  2 puff Inhalation BID Lance Coon, MD   2 puff at 09/14/18 [phone]  . montelukast (SINGULAIR) tablet 10 mg  10 mg Oral Corwin Levins, MD   10 mg at 09/13/18 2020  . mupirocin ointment (BACTROBAN) 2 % 1 application  1 application Nasal BID Lance Coon, MD   1 application at 25/85/27 0901  . ondansetron (ZOFRAN) tablet 4 mg  4 mg Oral Q6H PRN Lance Coon, MD       Or  . ondansetron [facility]) injection 4 mg  4 mg Intravenous Q6H PRN Lance Coon, MD   4 mg at 09/14/18 0200  . pantoprazole (PROTONIX) EC tablet 40 mg  40 mg Oral Daily Lance Coon, MD   40 mg at 09/14/18 0905  . tiotropium (SPIRIVA) inhalation capsule ([facility] use ONLY) 18 mcg  18 mcg Inhalation Daily Lance Coon, MD   18 mcg at 09/14/18 0902  . vancomycin (VANCOCIN) 1,500 mg in sodium chloride 0.9 % 500 mL IVPB  1,500 mg Intravenous Laurence Spates, MD 250 mL/hr at 09/14/18 1323 1,500 mg at 09/14/18 1323  . venlafaxine XR (EFFEXOR-XR) 24 hr capsule 225 mg  225 mg Oral Q breakfast Henreitta Leber, MD   225 mg at 09/14/18 1300   Past Medical History:  Diagnosis Date  . Arthritis   . Asthma   . Cancer ([place])   . CHF (congestive heart failure) ([place])   . Diabetes mellitus without complication ([place])   . Gout   . Hyperlipemia   . Hypertension   . Osteoarthritis   . Stroke [facility])    Past Surgical History:  Procedure Laterality Date  . CHOLECYSTECTOMY     Social History Social History   Tobacco Use  . Smoking status: Former Research scientist (life sciences)  . Smokeless tobacco: Never Used  Substance Use Topics  . Alcohol use: No  . Drug use: No   Family History Family History  Family history unknown: Yes  Denies family history of peripheral artery disease, venous disease or bleeding/clotting disorder.  Allergies  Allergen Reactions  .  Morphine And Related Other (See Comments)    Pt states that this medication put him in a coma.  . Percocet [Oxycodone-Acetaminophen] Other (See Comments)    Pt states that this medication put him in a coma.  . Bactrim [Sulfamethoxazole-Trimethoprim] Other (See Comments)    Reaction:  Unknown   . Benadryl [Diphenhydramine Hcl] Hives  . Keflex [Cephalexin] Hives  . Penicillins Hives and  Other (See Comments)    Has patient had a PCN reaction causing immediate rash, facial/tongue/throat swelling, SOB or lightheadedness with hypotension: No Has patient had a PCN reaction causing severe rash involving mucus membranes or skin necrosis: No Has patient had a PCN reaction that required hospitalization No Has patient had a PCN reaction occurring within the last 10 years: No If all of the above answers are "NO", then may proceed with Cephalosporin use.   REVIEW OF SYSTEMS (Negative unless checked)  Constitutional: [] Weight loss  [] Fever  [] Chills Cardiac: [] Chest pain   [] Chest pressure   [] Palpitations   [] Shortness of breath when laying flat   [] Shortness of breath at rest   [x] Shortness of breath with exertion. Vascular:  [] Pain in legs with walking   [x] Pain in legs at rest   [x] Pain in legs when laying flat   [] Claudication   [] Pain in feet when walking  [x] Pain in feet at rest  [x] Pain in feet when laying flat   [] History of DVT   [] Phlebitis   [x] Swelling in legs   [] Varicose veins   [x] Non-healing ulcers Pulmonary:   [] Uses home oxygen   [] Productive cough   [] Hemoptysis   [] Wheeze  [x] COPD   [] Asthma Neurologic:  [] Dizziness  [] Blackouts   [] Seizures   [x] History of stroke   [] History of TIA  [] Aphasia   [] Temporary blindness   [] Dysphagia   [] Weakness or numbness in arms   [] Weakness or numbness in legs Musculoskeletal:  [] Arthritis   [] Joint swelling   [] Joint pain   [] Low back pain Hematologic:  [] Easy bruising  [] Easy bleeding   [] Hypercoagulable state   [] Anemic   [] Hepatitis Gastrointestinal:  [] Blood in stool   [] Vomiting blood  [] Gastroesophageal reflux/heartburn   [] Difficulty swallowing. Genitourinary:  [] Chronic kidney disease   [] Difficult urination  [] Frequent urination  [] Burning with urination   [] Blood in urine Skin:  [x] Rashes   [x] Ulcers   [x] Wounds Psychological:  [] History of anxiety   []  History of major depression.  Physical Examination  Vitals:   09/13/18 1605 09/13/18 2021 09/13/18 2226 09/14/18 0845  BP: (!) 145/66 (!) 141/46 (!) 154/63 134/64  Pulse: 69 76 72 96  Resp:  18 18 19   Temp: 98.4 F (36.9 C) 98.7 F (37.1 C) 98.9 F (37.2 C) 98.5 F (36.9 C)  TempSrc: Oral Oral Oral Oral  SpO2: 96% 98% 97% 96%  Weight:      Height:       Body mass index is 44.6 kg/m. Gen:  NAD, Morbidly Obese Head: [place]/AT, No temporalis wasting. Prominent temp pulse not noted. Ear/Nose/Throat: Hearing grossly intact, nares w/o erythema or drainage, oropharynx w/o Erythema/Exudate Eyes: Sclera non-icteric, conjunctiva clear Neck: Trachea midline.  No JVD.  Pulmonary:  Good air movement, respirations not labored, equal bilaterally.  Cardiac: RRR, normal S1, S2. Vascular:  Vessel Right Left  Radial Palpable Palpable  Ulnar Palpable Palpable  Brachial Palpable Palpable  Carotid Palpable, without bruit Palpable, without bruit  Aorta Not palpable N/A  Femoral Palpable Palpable  Popliteal Non-Palpable Non-Palpable  PT Non-Palpable Non-Palpable  DP Non-Palpable Non-Palpable   Right lower extremity: Severe stasis dermatitis noted.  Fibrosis noted.  Unable to palpate pedal pulses due to body habitus and edema however the extremity is warm down to the toes.  Weeping ulcerations noted.  Foul odor.  Moderate to severe nonpitting edema noted. Left lower extremity: Severe stasis dermatitis noted.  Fibrosis noted.  Unable to palpate pedal pulses due to body habitus and edema however  the extremity is warm down to the toes.  Moderate to severe  nonpitting edema noted.  Gastrointestinal: soft, non-tender/non-distended. No guarding/reflex.  Musculoskeletal: M/S 5/5 throughout.  Extremities without ischemic changes.  Neurologic: Sensation grossly intact in extremities.  Symmetrical.  Speech is fluent. Motor exam as listed above. Psychiatric: Judgment intact, Mood & affect appropriate for pt's clinical situation. Dermatologic: As above Lymph : No Cervical, Axillary, or Inguinal lymphadenopathy.  CBC Lab Results  Component Value Date   WBC 10.2 09/14/2018   HGB 10.9 (L) 09/14/2018   HCT 34.2 (L) 09/14/2018   MCV 90.7 09/14/2018   PLT 167 09/14/2018   BMET    Component Value Date/Time   NA 140 09/14/2018 0314   NA 136 09/09/2013 2101   K 3.7 09/14/2018 0314   K 4.8 09/09/2013 2101   CL 106 09/14/2018 0314   CL 101 09/09/2013 2101   CO2 29 09/14/2018 0314   CO2 32 09/09/2013 2101   GLUCOSE 96 09/14/2018 0314   GLUCOSE 115 (H) 09/09/2013 2101   BUN 15 09/14/2018 0314   BUN 13 09/09/2013 2101   CREATININE 0.85 09/14/2018 0314   CREATININE 0.95 09/09/2013 2101   CALCIUM 8.3 (L) 09/14/2018 0314   CALCIUM 8.5 09/09/2013 2101   GFRNONAA >60 09/14/2018 0314   GFRNONAA >60 09/09/2013 2101   GFRAA >60 09/14/2018 0314   GFRAA >60 09/09/2013 2101   Estimated Creatinine Clearance: 109.6 mL/min (by C-G formula based on SCr of 0.85 mg/dL).  COAG No results found for: INR, PROTIME  Radiology Dg Foot Complete Right  Result Date: 09/12/2018 CLINICAL DATA:  Cellulitis EXAM: RIGHT FOOT COMPLETE - 3+ VIEW COMPARISON:  None. FINDINGS: Mild osteopenia. No fracture or dislocation. No soft tissue ulceration or emphysema. No osteolysis. IMPRESSION: No radiographic evidence of osteomyelitis. Electronically Signed   By: Ulyses Jarred M.D.   On: 09/12/2018 21:55   Assessment/Plan The patient is a 73 year old male with a past medical history of diabetes, hypertension, hyperlipidemia, chronic diastolic congestive heart failure, COPD,  history of CVA, osteoarthritis, asthma, lymphedema/chronic venous insufficiency who presented to the [facility] emergency department via EMS from [facility].  Patient was admitted with cellulitis, worsening edema and weeping lower extremity ulcerations 1. Lymphedema / Venous Stasis: I do not see any imaging in the patient's charts confirming venous reflux disease.  Most likely has a component of both. Patient is unable to provide an adequate history.  Patient states that he has never had any "tests" / " treatments" to his legs.  Patient is minimally ambulatory. Would recommend three layer zinc oxide unna wraps to the bilateral extremities changed at least once weekly unless increased drainage is noted would change more often.  Patient should elevate his legs heart level higher as much as possible.  A formal venous reflux study to the bilateral extremities would be helpful to rule out if any venous disease is present.  Patient would also benefit from a lymphedema pump.  We will be happy to complete this as an outpatient. 2. PAD: The patient has multiple risk factors for peripheral artery disease.  The patient is minimally ambulatory and does not describe claudication-like symptoms however I will order an ABI to rule out any contributing peripheral artery disease.  The contraindication to unna wraps is an ABI notable for severe or critical atherosclerotic disease (generally <0.5).  If significant peripheral artery disease is noted we will move forward with an angiogram in an attempt to revascularize the leg. Will follow  up ABI.  3. Diabetes: On appropriate medications. Encouraged good control as its slows the progression of atherosclerotic disease and impedes wound healing 4. Hypertension: On appropriate medications. Encouraged good control as its slows the progression of atherosclerotic disease 5. Hyperlipidemia: On ASA and statin. Encouraged good control as its slows the progression  of atherosclerotic disease.  Discussed with Dr. Francene Castle, PA-C  09/14/2018 3:08 PM  This note was created with Dragon medical transcription system.  Any error is purely unintentional.

## 2018-09-15 DIAGNOSIS — L8990 Pressure ulcer of unspecified site, unspecified stage: Secondary | ICD-10-CM

## 2018-09-15 LAB — GLUCOSE, CAPILLARY
GLUCOSE-CAPILLARY: 98 mg/dL (ref 70–99)
Glucose-Capillary: 149 mg/dL — ABNORMAL HIGH (ref 70–99)
Glucose-Capillary: 171 mg/dL — ABNORMAL HIGH (ref 70–99)
Glucose-Capillary: 112 mg/dL — ABNORMAL HIGH (ref 70–99)

## 2018-09-15 MED ORDER — DOXYCYCLINE HYCLATE 100 MG PO TABS
100.0000 mg | ORAL_TABLET | Freq: Two times a day (BID) | ORAL | Status: DC
  Administered 2018-09-15 – 2018-09-16 (×3): 100 mg via ORAL
  Filled 2018-09-15 (×3): qty 1

## 2018-09-15 NOTE — Progress Notes (Signed)
Juliann Pulse with [facility] is aware that per MD note today patient is not stable for D/C. Plan is for patient to D/C back to [place] will transport when medically stable.   [employer], LCSW [phone]

## 2018-09-15 NOTE — Care Management (Signed)
Patient is on Contact precautions, unable to get the Important message from [facility] signed.  Left a copy in the room.

## 2018-09-15 NOTE — Progress Notes (Signed)
Physical Therapy Treatment Patient Details Name: Mario Blackburn MRN: [id number] DOB: [birth year] Today's Date: 09/15/2018    History of Present Illness Pt is a 73 y.o. male presenting to hospital 09/12/18 with R LE pain with erythema and drainage.  Pt admitted with cellulitis.  [facility] representative reporting pt with h/o R UE fx (pt reports June 2019).  PMH includes asthma, arthritis, CHF, DM, htn, HLD, LE edema/chronic venous stasis, CA, stroke.    PT Comments    Pt lethargic but agrees to session.  Participated in exerises as described below.  Pt with little to no assist with exercises despite encouragement and education.  Pt c/o multiple soreness areas with hand holds for assist - gentle, open palm used to support LE's  Pt falling asleep during session.  Mobility deferred at this time for pt and staff safety due to lethargy.     Follow Up Recommendations  SNF     Equipment Recommendations       Recommendations for Other Services       Precautions / Restrictions Precautions Precautions: Fall Restrictions Weight Bearing Restrictions: No Other Position/Activity Restrictions: H/o R UE fx 2019 (pt reports minimal use of R UE d/t pain)    Mobility  Bed Mobility               General bed mobility comments: deferred due to lethargy  Transfers                    Ambulation/Gait                 Stairs             Wheelchair Mobility    Modified Rankin (Stroke Patients Only)       Balance                                            Cognition Arousal/Alertness: Lethargic Behavior During Therapy: WFL for tasks assessed/performed Overall Cognitive Status: Within Functional Limits for tasks assessed                                        Exercises Other Exercises Other Exercises: BLE AA/PROM BLE x 10 for ankle pumps, heel slides, aba/dd and slr - little to no effort by pt    General Comments         Pertinent Vitals/Pain Pain Assessment: Faces Faces Pain Scale: Hurts little more Pain Location: general discomfort and complaints with any LE ROM and assist points Pain Descriptors / Indicators: Tender;Sore Pain Intervention(s): Limited activity within patient's tolerance    Home Living                      Prior Function            PT Goals (current goals can now be found in the care plan section) Progress towards PT goals: Progressing toward goals    Frequency    Min 2X/week      PT Plan Current plan remains appropriate    Co-evaluation              AM-PAC PT "6 Clicks" Mobility   Outcome Measure  Help needed turning from your back to your side while in a flat bed without using  bedrails?: Total Help needed moving from lying on your back to sitting on the side of a flat bed without using bedrails?: Total Help needed moving to and from a bed to a chair (including a wheelchair)?: Total Help needed standing up from a chair using your arms (e.g., wheelchair or bedside chair)?: Total Help needed to walk in hospital room?: Total Help needed climbing 3-5 steps with a railing? : Total 6 Click Score: 6    End of Session   Activity Tolerance: Patient limited by lethargy;Other (comment) Patient left: in bed;with bed alarm set;with call bell/phone within reach;with family/visitor present   Pain - Right/Left: Right Pain - part of body: Leg     Time: 0175-1025 PT Time Calculation (min) (ACUTE ONLY): 10 min  Charges:  $Therapeutic Exercise: 8-22 mins                     Chesley Noon, PTA 09/15/18, 9:52 AM

## 2018-09-15 NOTE — Care Management Important Message (Signed)
Important Message  Patient Details  Name: Mario Blackburn MRN: [id number] Date of Birth: 10/01/1945   Medicare Important Message Given:  Yes    Su Hilt, RN 09/15/2018, 4:03 PM

## 2018-09-15 NOTE — Progress Notes (Signed)
[facility] NAME: Mario Blackburn    MR#:  [id number]  DATE OF BIRTH:  24-Jan-1946  SUBJECTIVE:   Patient presented to the hospital due to right foot redness and swelling suspected to have cellulitis.   Wound team yesterday and right leg wrapped they were worried about possible vascular insufficiency.  Seen by vascular surgery and ABIs ordered but currently pending.  No other acute events overnight.  REVIEW OF SYSTEMS:    Review of Systems  Constitutional: Negative for chills and fever.  HENT: Negative for congestion and tinnitus.   Eyes: Negative for blurred vision and double vision.  Respiratory: Negative for cough, shortness of breath and wheezing.   Cardiovascular: Positive for leg swelling. Negative for chest pain, orthopnea and PND.  Gastrointestinal: Negative for abdominal pain, diarrhea, nausea and vomiting.  Genitourinary: Negative for dysuria and hematuria.  Skin: Negative for itching.  Neurological: Positive for weakness (generalized weakness. ). Negative for dizziness, sensory change and focal weakness.  All other systems reviewed and are negative.   Nutrition: Heart Healthy/Carb control Tolerating Diet: Yes Tolerating PT: Await Eval.    DRUG ALLERGIES:   Allergies  Allergen Reactions  . Morphine And Related Other (See Comments)    Pt states that this medication put him in a coma.  . Percocet [Oxycodone-Acetaminophen] Other (See Comments)    Pt states that this medication put him in a coma.  . Bactrim [Sulfamethoxazole-Trimethoprim] Other (See Comments)    Reaction:  Unknown   . Benadryl [Diphenhydramine Hcl] Hives  . Keflex [Cephalexin] Hives  . Penicillins Hives and Other (See Comments)    Has patient had a PCN reaction causing immediate rash, facial/tongue/throat swelling, SOB or lightheadedness with hypotension: No Has patient had a PCN reaction causing severe rash involving mucus membranes or skin  necrosis: No Has patient had a PCN reaction that required hospitalization No Has patient had a PCN reaction occurring within the last 10 years: No If all of the above answers are "NO", then may proceed with Cephalosporin use.    VITALS:  Blood pressure (!) 143/65, pulse 76, temperature 99.1 F (37.3 C), resp. rate 20, height 5\' 9"  (1.753 m), weight 134.8 kg, SpO2 95 %.  PHYSICAL EXAMINATION:   Physical Exam  GENERAL:  73 y.o.-year-old obese patient lying in bed in no acute distress.  EYES: Pupils equal, round, reactive to light and accommodation. No scleral icterus. Extraocular muscles intact.  HEENT: Head atraumatic, normocephalic. Oropharynx and nasopharynx clear.  NECK:  Supple, no jugular venous distention. No thyroid enlargement, no tenderness.  LUNGS: Normal breath sounds bilaterally, no wheezing, rales, rhonchi. No use of accessory muscles of respiration.  CARDIOVASCULAR: S1, S2 normal. No murmurs, rubs, or gallops.  ABDOMEN: Soft, nontender, nondistended. Bowel sounds present. No organomegaly or mass.  EXTREMITIES: No cyanosis, clubbing, +1-2 pitting edema bilaterally.  Signs of chronic venous stasis bilaterally.  Right foot wrapped in kerlex from local wound care from yesterday.  NEUROLOGIC: Cranial nerves II through XII are intact. No focal Motor or sensory deficits b/l. Globally weak.   PSYCHIATRIC: The patient is alert and oriented x 3.  SKIN: No obvious rash, lesion, or ulcer.    LABORATORY PANEL:   CBC Recent Labs  Lab 09/14/18 0314  WBC 10.2  HGB 10.9*  HCT 34.2*  PLT 167   ------------------------------------------------------------------------------------------------------------------  Chemistries  Recent Labs  Lab 09/12/18 2013  09/14/18 0314  NA 139   < > 140  K 4.7   < > 3.7  CL 103   < > 106  CO2 27   < > 29  GLUCOSE 142*   < > 96  BUN 16   < > 15  CREATININE 0.86   < > 0.85  CALCIUM 8.6*   < > 8.3*  AST 13*  --   --   ALT 9  --   --     ALKPHOS 51  --   --   BILITOT 0.5  --   --    < > = values in this interval not displayed.   ------------------------------------------------------------------------------------------------------------------  Cardiac Enzymes No results for input(s): TROPONINI in the last 168 hours. ------------------------------------------------------------------------------------------------------------------  RADIOLOGY:  No results found.   ASSESSMENT AND PLAN:   73 year old male with past medical history of previous CVA, osteoarthritis, hypertension, hyperlipidemia, gout, CHF, diabetes, obstructive sleep apnea, who presented to the hospital due to right foot pain swelling and redness.  1.  Right foot redness swelling and pain- due to chronic venous stasis with mild superinfected cellulitis. - Clinically patient is afebrile and hemodynamically stable with no white cell count. -We will DC IV vancomycin, switch to oral doxycycline today.  Appreciate wound team consult and continue local wound care.  Vascular consult also appreciated and ABIs pending and if negative would place Unna boots.  2 diabetes type 2 without complication- continue Lantus, sliding scale insulin.  3.  Essential hypertension-continue Norvasc, lisinopril, Toprol. -Blood pressure stable.  4.  COPD-no acute exacerbation.  Continue Dulera, Spiriva.  5.  GERD-continue Protonix.  6.  Hypothyroidism-continue Synthroid.  7. Hx of chronic diastolic CHF - cont. Toprol, Lasix.  - clinically not in CHF.    Appreciate PT and pt. Is long term patient and [employer].    All the records are reviewed and case discussed with Care Management/Social Worker. Management plans discussed with the patient, family and they are in agreement.  CODE STATUS: Full code  DVT Prophylaxis: Lovenox  TOTAL TIME TAKING CARE OF THIS PATIENT: 30 minutes.   POSSIBLE D/C IN 1-2 DAYS, DEPENDING ON CLINICAL CONDITION.   Henreitta Leber M.D on  09/15/2018 at 1:15 PM  Between 7am to 6pm - Pager - [phone]  After 6pm go to [email or web address] - Proofreader  [facility]  Office  [phone]  CC: Primary care physician; System, Pcp Not In

## 2018-09-16 ENCOUNTER — Inpatient Hospital Stay: Payer: Medicare (Managed Care)

## 2018-09-16 LAB — GLUCOSE, CAPILLARY
Glucose-Capillary: 106 mg/dL — ABNORMAL HIGH (ref 70–99)
Glucose-Capillary: 128 mg/dL — ABNORMAL HIGH (ref 70–99)

## 2018-09-16 MED ORDER — TIOTROPIUM BROMIDE MONOHYDRATE 18 MCG IN CAPS: 18.0000 ug | ORAL_CAPSULE | Freq: Every day | RESPIRATORY_TRACT | 12 refills | Status: AC

## 2018-09-16 MED ORDER — MUPIROCIN 2 % EX OINT: 1.0000 "application " | TOPICAL_OINTMENT | Freq: Two times a day (BID) | CUTANEOUS | 0 refills | Status: AC

## 2018-09-16 MED ORDER — AMLODIPINE BESYLATE 10 MG PO TABS: 10.0000 mg | ORAL_TABLET | Freq: Every day | ORAL | 0 refills | Status: AC

## 2018-09-16 MED ORDER — DOXYCYCLINE HYCLATE 100 MG PO TABS: 100.0000 mg | ORAL_TABLET | Freq: Two times a day (BID) | ORAL | 0 refills | Status: AC

## 2018-09-16 MED ORDER — PANTOPRAZOLE SODIUM 40 MG PO TBEC: 40.0000 mg | DELAYED_RELEASE_TABLET | Freq: Every day | ORAL | 0 refills | Status: AC

## 2018-09-16 NOTE — Discharge Summary (Signed)
Mario Blackburn, is a 73 y.o. male  DOB 09/28/1945  MRN [id number].  Admission date:  09/12/2018  Admitting Physician  Lance Coon, MD  Discharge Date:  09/16/2018   Primary MD  System, Pcp Not In  Recommendations for primary care physician for things to follow:  Follow-up with vascular surgery in 1 week   Admission Diagnosis  Cellulitis of right lower extremity [L03.115]   Discharge Diagnosis  Cellulitis of right lower extremity [L03.115]    Principal Problem:   Cellulitis in diabetic foot ([place]) Active Problems:   Diabetes ([place])   HTN (hypertension)   HLD (hyperlipidemia)   Chronic diastolic CHF (congestive heart failure) ([facility])   COPD (chronic obstructive pulmonary disease) ([facility])   Pressure injury of skin      Past Medical History:  Diagnosis Date  . Arthritis   . Asthma   . Cancer ([place])   . CHF (congestive heart failure) ([place])   . Diabetes mellitus without complication ([place])   . Gout   . Hyperlipemia   . Hypertension   . Osteoarthritis   . Stroke [facility])     Past Surgical History:  Procedure Laterality Date  . CHOLECYSTECTOMY         History of present illness and  Hospital Course:     Kindly see H&P for history of present illness and admission details, please review complete Labs, Consult reports and Test reports for all details in brief  HPI  from the history and physical done on the day of admission 73 year old male with history of diabetes, hypertension, hyperlipidemia, chronic diastolic heart failure, COPD, chronic venous stasis of the legs admitted because of right leg redness, pain, admitted forright leg cellulitis.   Hospital Course   #1 right foot cellulitis, initially received IV vancomycin, changed to oral doxycycline, continue doxycycline for 5 days at discharge, patient has history  of chronic venous stasis in the legs, seen by vascular, ordered ABI, pending those test. 2.  Chronic venous stasis of the legs, has lymphedema, has a history of lymphedema for a long time, has wound wraps all the time but removed at the nursing facility secondary to increased drainage of the right leg before he came, patient has swelling of the legs for years.  Did not have any fever, nausea, vomiting. Plan is to have wound no wraps to bilateral legs change at nursing home once a week unless increased drainage is noted ABI ordered, patient went for arterial ultrasound to evaluate for peripheral artery disease.likely discharge later today    3.  Diabetes mellitus type 2: Controlled.  Continue Lantus, sliding scale insulin with coverage  4.  Essential hypertension: Controlled, continue Norvasc, lisinopril, Toprol.  Chronic diastolic heart failure: Stable For history of depression, patient is on high-dose Depakote, Abilify, venlafaxine. Marland Kitchen Discharge Condition: Stable   Follow UP  Contact information for after-discharge care    Destination    [facility] .   Service:  Skilled Nursing Contact information: [address] [place] [phone]                Discharge Instructions  and  Discharge Medications     Allergies as of 09/16/2018      Reactions   Morphine And Related Other (See Comments)   Pt states that this medication put him in a coma.   Percocet [oxycodone-acetaminophen] Other (See Comments)   Pt states that this medication put him in a coma.   Bactrim [  sulfamethoxazole-trimethoprim] Other (See Comments)   Reaction:  Unknown    Benadryl [diphenhydramine Hcl] Hives   Keflex [cephalexin] Hives   Penicillins Hives, Other (See Comments)   Has patient had a PCN reaction causing immediate rash, facial/tongue/throat swelling, SOB or lightheadedness with hypotension: No Has patient had a PCN reaction causing  severe rash involving mucus membranes or skin necrosis: No Has patient had a PCN reaction that required hospitalization No Has patient had a PCN reaction occurring within the last 10 years: No If all of the above answers are "NO", then may proceed with Cephalosporin use.      Medication List    STOP taking these medications   omeprazole 20 MG capsule Commonly known as:  PRILOSEC Replaced by:  pantoprazole 40 MG tablet   traMADol 50 MG tablet Commonly known as:  ULTRAM     TAKE these medications   acetaminophen 650 MG CR tablet Commonly known as:  TYLENOL Take 650 mg by mouth 2 (two) times daily.   albuterol (2.5 MG/3ML) 0.083% nebulizer solution Commonly known as:  PROVENTIL Take 2.5 mg by nebulization 4 (four) times daily as needed for wheezing or shortness of breath.   amLODipine 10 MG tablet Commonly known as:  NORVASC Take 1 tablet (10 mg total) by mouth at bedtime.   ARIPiprazole 5 MG tablet Commonly known as:  ABILIFY Take 5 mg by mouth daily.   aspirin EC 81 MG tablet Take 81 mg by mouth daily.   atorvastatin 80 MG tablet Commonly known as:  LIPITOR Take 80 mg by mouth at bedtime.   COMBIGAN 0.2-0.5 % ophthalmic solution Generic drug:  brimonidine-timolol Place 1 drop into both eyes 2 (two) times daily.   DESITIN 13 % Crea Generic drug:  Zinc Oxide Apply 1 application topically 2 (two) times daily. Apply to scrotum   diclofenac sodium 1 % Gel Commonly known as:  VOLTAREN Apply 2-4 g topically 2 (two) times daily. Apply 2g to right shoulder and 4g to right knee   divalproex 500 MG 24 hr tablet Commonly known as:  DEPAKOTE ER Take 1,000 mg by mouth 2 (two) times daily.   doxycycline 100 MG tablet Commonly known as:  VIBRA-TABS Take 1 tablet (100 mg total) by mouth every 12 (twelve) hours for 5 days.   fluticasone 50 MCG/ACT nasal spray Commonly known as:  FLONASE Place 1 spray into both nostrils daily.   Fluticasone-Salmeterol 250-50 MCG/DOSE  Aepb Commonly known as:  ADVAIR Inhale 1 puff into the lungs 2 (two) times daily.   furosemide 40 MG tablet Commonly known as:  LASIX Take 40 mg by mouth daily.   gabapentin 300 MG capsule Commonly known as:  NEURONTIN Take 600 mg by mouth at bedtime.   insulin aspart 100 UNIT/ML injection Commonly known as:  novoLOG Inject 12-14 Units into the skin 2 (two) times daily with a meal. 12 units with breakfast and 14 units with dinner   insulin glargine 100 UNIT/ML injection Commonly known as:  LANTUS Inject 10 Units into the skin at bedtime.   ipratropium 0.03 % nasal spray Commonly known as:  ATROVENT Place 2 sprays into the nose 3 (three) times daily.   levothyroxine 50 MCG tablet Commonly known as:  SYNTHROID, LEVOTHROID Take 50 mcg by mouth daily before breakfast.   lisinopril 40 MG tablet Commonly known as:  PRINIVIL,ZESTRIL Take 40 mg by mouth 2 (two) times daily.   LUMIGAN 0.01 % Soln Generic drug:  bimatoprost Place 1 drop into both  eyes at bedtime.   magnesium oxide 400 MG tablet Commonly known as:  MAG-OX Take 400 mg by mouth daily.   metoprolol succinate 25 MG 24 hr tablet Commonly known as:  TOPROL-XL Take 25 mg by mouth daily.   mupirocin ointment 2 % Commonly known as:  BACTROBAN Place 1 application into the nose 2 (two) times daily.   nitroGLYCERIN 0.4 MG SL tablet Commonly known as:  NITROSTAT Place 0.4 mg under the tongue every 5 (five) minutes as needed for chest pain.   pantoprazole 40 MG tablet Commonly known as:  PROTONIX Take 1 tablet (40 mg total) by mouth daily. Start taking on:  September 17, 2018 Replaces:  omeprazole 20 MG capsule   ranitidine 150 MG tablet Commonly known as:  ZANTAC Take 150 mg by mouth 2 (two) times daily.   spironolactone 25 MG tablet Commonly known as:  ALDACTONE Take 25 mg by mouth daily.   tiotropium 18 MCG inhalation capsule Commonly known as:  SPIRIVA Place 1 capsule (18 mcg total) into inhaler and  inhale daily. Start taking on:  September 17, 2018 What changed:  when to take this   venlafaxine XR 75 MG 24 hr capsule Commonly known as:  EFFEXOR-XR Take 75 mg by mouth daily. Take with 150mg  capsule for 225mg    venlafaxine XR 150 MG 24 hr capsule Commonly known as:  EFFEXOR-XR Take 150 mg by mouth daily with breakfast. Take with 75mg  capsule for 225mg    vitamin A & D ointment Apply 1 application topically 3 (three) times daily.         Diet and Activity recommendation: See Discharge Instructions above   Consults obtained; vascular surgery  Major procedures and Radiology Reports - PLEASE review detailed and final reports for all details, in brief -      Dg Foot Complete Right  Result Date: 09/12/2018 CLINICAL DATA:  Cellulitis EXAM: RIGHT FOOT COMPLETE - 3+ VIEW COMPARISON:  None. FINDINGS: Mild osteopenia. No fracture or dislocation. No soft tissue ulceration or emphysema. No osteolysis. IMPRESSION: No radiographic evidence of osteomyelitis. Electronically Signed   By: Ulyses Jarred M.D.   On: 09/12/2018 21:55    Micro Results     Recent Results (from the past 240 hour(s))  Blood culture (routine x 2)     Status: None (Preliminary result)   Collection Time: 09/12/18  8:19 PM  Result Value Ref Range Status   Specimen Description BLOOD LEFT HAND  Final   Special Requests   Final    BOTTLES DRAWN AEROBIC AND ANAEROBIC Blood Culture adequate volume   Culture   Final    NO GROWTH 4 DAYS Performed at [facility], [address] [place], [place] [address]    Report Status PENDING  Incomplete  MRSA PCR Screening     Status: Abnormal   Collection Time: 09/13/18 12:26 AM  Result Value Ref Range Status   MRSA by PCR POSITIVE (A) NEGATIVE Final    Comment:        The GeneXpert MRSA Assay (FDA approved for NASAL specimens only), is one component of a comprehensive MRSA colonization surveillance program. It is not intended to diagnose MRSA infection nor  to guide or monitor treatment for MRSA infections. RESULT CALLED TO, READ BACK BY AND VERIFIED WITH: MATT PAGE 09/13/18 AT 0213 BY HS Performed at [facility], [address] [place], [place] [address]   Blood culture (routine x 2)     Status: None (Preliminary result)  Collection Time: 09/13/18 12:50 AM  Result Value Ref Range Status   Specimen Description BLOOD LEFT HAND  Final   Special Requests   Final    BOTTLES DRAWN AEROBIC AND ANAEROBIC Blood Culture adequate volume   Culture   Final    NO GROWTH 3 DAYS Performed at [facility], [address] [place], [place] [address]    Report Status PENDING  Incomplete       Today   Subjective:   Mario Blackburn today feels better and wants to be discharged back to [facility].  Objective:   Blood pressure 130/60, pulse 84, temperature 99 F (37.2 C), temperature source Oral, resp. rate 18, height 5\' 9"  (1.753 m), weight 134.8 kg, SpO2 96 %.   Intake/Output Summary (Last 24 hours) at 09/16/2018 1316 Last data filed at 09/16/2018 0447 Gross per 24 hour  Intake 237 ml  Output 1850 ml  Net -1613 ml    Exam Awake Alert, Oriented x 3, No new F.N deficits, Normal affect [place].AT,PERRAL Supple Neck,No JVD, No cervical lymphadenopathy appriciated.  Symmetrical Chest wall movement, Good air movement bilaterally, CTAB RRR,No Gallops,Rubs or new Murmurs, No Parasternal Heave +ve B.Sounds, Abd Soft, Non tender, No organomegaly appriciated, No rebound -guarding or rigidity. No Cyanosis, no neck leg edema secondary to venous stasis.  Data Review   CBC w Diff:  Lab Results  Component Value Date   WBC 10.2 09/14/2018   HGB 10.9 (L) 09/14/2018   HGB 12.4 (L) 09/09/2013   HCT 34.2 (L) 09/14/2018   HCT 38.8 (L) 09/09/2013   PLT 167 09/14/2018   PLT 170 09/09/2013   LYMPHOPCT 28 09/12/2018   LYMPHOPCT 23.4 09/09/2013   MONOPCT 13 09/12/2018   MONOPCT 14.7 09/09/2013   EOSPCT 9 09/12/2018   EOSPCT 3.6  09/09/2013   BASOPCT 0 09/12/2018   BASOPCT 0.4 09/09/2013    CMP:  Lab Results  Component Value Date   NA 140 09/14/2018   NA 136 09/09/2013   K 3.7 09/14/2018   K 4.8 09/09/2013   CL 106 09/14/2018   CL 101 09/09/2013   CO2 29 09/14/2018   CO2 32 09/09/2013   BUN 15 09/14/2018   BUN 13 09/09/2013   CREATININE 0.85 09/14/2018   CREATININE 0.95 09/09/2013   PROT 6.5 09/12/2018   PROT 7.1 09/09/2013   ALBUMIN 3.3 (L) 09/12/2018   ALBUMIN 3.0 (L) 09/09/2013   BILITOT 0.5 09/12/2018   BILITOT 0.7 09/09/2013   ALKPHOS 51 09/12/2018   ALKPHOS 84 09/09/2013   AST 13 (L) 09/12/2018   AST 44 (H) 09/09/2013   ALT 9 09/12/2018   ALT 36 09/09/2013  .   Total Time in preparing paper work, data evaluation and todays exam - 35 minutes  Epifanio Lesches M.D on 09/16/2018 at 1:16 PM    Note: This dictation was prepared with Dragon dictation along with smaller phrase technology. Any transcriptional errors that result from this process are unintentional.

## 2018-09-16 NOTE — Progress Notes (Signed)
Report called and given to [place] at [facility]. IVs removed. Pt dressed. Awaiting [facility] for transport back to [facility].

## 2018-09-16 NOTE — Progress Notes (Signed)
Waiting for ABI results before discharging the patient/ epic text message to vascular PA Marcelle Overlie waiting for reply.  Patient is eager to be discharged but would like to have ABI result.

## 2018-09-16 NOTE — Progress Notes (Signed)
Patient is medically stable for D/C back to [facility] today. Per Juliann Pulse with [facility] they will transport patient between 4:15 pm and 4:30 pm. Per Neoma Laming admissions coordinator at [facility] patient can return today to room 214-A. RN will call report to [facility]. Clinical Education officer, museum (CSW) sent D/C orders to [facility] via [place]. Patient's wife [name] is at bedside and aware of above. Please reconsult if future social work needs arise. CSW signing off.   [employer], LCSW [phone]

## 2018-09-16 NOTE — Plan of Care (Signed)
  Problem: Pain Managment: Goal: General experience of comfort will improve Outcome: Progressing   

## 2018-09-17 ENCOUNTER — Other Ambulatory Visit (INDEPENDENT_AMBULATORY_CARE_PROVIDER_SITE_OTHER): Payer: Self-pay | Admitting: Vascular Surgery

## 2018-09-17 DIAGNOSIS — M79605 Pain in left leg: Principal | ICD-10-CM

## 2018-09-17 DIAGNOSIS — M79604 Pain in right leg: Secondary | ICD-10-CM

## 2018-09-17 LAB — CULTURE, BLOOD (ROUTINE X 2)
Culture: NO GROWTH
Special Requests: ADEQUATE

## 2018-09-18 LAB — CULTURE, BLOOD (ROUTINE X 2)
Culture: NO GROWTH
Special Requests: ADEQUATE

## 2018-09-20 ENCOUNTER — Ambulatory Visit (INDEPENDENT_AMBULATORY_CARE_PROVIDER_SITE_OTHER): Payer: Medicare (Managed Care) | Admitting: Vascular Surgery

## 2018-09-20 ENCOUNTER — Encounter (INDEPENDENT_AMBULATORY_CARE_PROVIDER_SITE_OTHER): Payer: Medicare (Managed Care)

## 2018-09-21 ENCOUNTER — Telehealth (INDEPENDENT_AMBULATORY_CARE_PROVIDER_SITE_OTHER): Payer: Self-pay

## 2018-09-21 NOTE — Telephone Encounter (Signed)
Spoke with Dr. Meredith Staggers and per him he does not feel the patient needs intervention at this time due to his legs are healing.

## 2018-09-29 ENCOUNTER — Ambulatory Visit (INDEPENDENT_AMBULATORY_CARE_PROVIDER_SITE_OTHER): Payer: Medicare (Managed Care) | Admitting: Vascular Surgery

## 2018-09-29 ENCOUNTER — Other Ambulatory Visit: Payer: Self-pay

## 2018-09-29 ENCOUNTER — Ambulatory Visit (INDEPENDENT_AMBULATORY_CARE_PROVIDER_SITE_OTHER): Payer: Medicare (Managed Care)

## 2018-09-29 ENCOUNTER — Encounter (INDEPENDENT_AMBULATORY_CARE_PROVIDER_SITE_OTHER): Payer: Self-pay | Admitting: Vascular Surgery

## 2018-09-29 VITALS — BP 136/80 | HR 67 | Resp 14 | Ht 69.0 in | Wt 290.0 lb

## 2018-09-29 DIAGNOSIS — I5032 Chronic diastolic (congestive) heart failure: Secondary | ICD-10-CM

## 2018-09-29 DIAGNOSIS — M79604 Pain in right leg: Secondary | ICD-10-CM

## 2018-09-29 DIAGNOSIS — E785 Hyperlipidemia, unspecified: Secondary | ICD-10-CM | POA: Diagnosis not present

## 2018-09-29 DIAGNOSIS — E1169 Type 2 diabetes mellitus with other specified complication: Secondary | ICD-10-CM

## 2018-09-29 DIAGNOSIS — R600 Localized edema: Secondary | ICD-10-CM | POA: Insufficient documentation

## 2018-09-29 DIAGNOSIS — M79605 Pain in left leg: Secondary | ICD-10-CM | POA: Diagnosis not present

## 2018-09-29 NOTE — Progress Notes (Signed)
Subjective:    Patient ID: Mario Blackburn, male    DOB: 1945/10/01, 73 y.o.   MRN: [id number] Chief Complaint  Patient presents with  . Follow-up    Patient was initially seen as a consult at [facility] for bilateral lower extremity edema and cellulitis.  During his inpatient stay, the wound nurse was consulted to apply three layer zinc oxide Unna wraps however refused without an ABI.  The [facility]'s ultrasound department was unable to obtain an ABI during his inpatient stay.  The patient presents today to review vascular studies and start his outpatient work-up for bilateral lower extremity edema.  The patient is currently at rehab however will be transitioning to a nursing home October 17, 2018.  The patient notes a longstanding history of bilateral lower extremity edema.  The patient does not engage in conservative therapy on a daily basis.  The patient has been treated with Unna wraps in the past.  The patient had multiple and recurrent bouts of cellulitis.  The patient has experienced ulcerations due to his edema to the bilateral legs in the past.  The symptoms to his right lower extremity are worse than his left lower extremity.  Patient underwent a bilateral ABI which was notable for: Right: 1.72, biphasic anterior tibial artery, triphasic posterior tibial artery with normal great toe waveforms. Left: 1.37, biphasic anterior tibial artery and posterior tibial artery with normal great toe waveforms. Patient denies any fever, nausea vomiting.  Review of Systems  Constitutional: Negative.   HENT: Negative.   Eyes: Negative.   Respiratory: Negative.   Cardiovascular: Positive for leg swelling.  Gastrointestinal: Negative.   Endocrine: Negative.   Genitourinary: Negative.   Musculoskeletal: Negative.   Skin: Positive for color change and wound.  Allergic/Immunologic: Negative.   Neurological: Negative.   Hematological: Negative.     Psychiatric/Behavioral: Negative.       Objective:   Physical Exam Constitutional:      Appearance: Normal appearance. He is normal weight.  HENT:     Head: Normocephalic and atraumatic.     Right Ear: External ear normal.     Left Ear: External ear normal.     Nose: Nose normal.     Mouth/Throat:     Mouth: Mucous membranes are dry.     Pharynx: Oropharynx is clear.  Eyes:     Extraocular Movements: Extraocular movements intact.     Conjunctiva/sclera: Conjunctivae normal.     Pupils: Pupils are equal, round, and reactive to light.  Neck:     Musculoskeletal: Normal range of motion.  Cardiovascular:     Rate and Rhythm: Normal rate and regular rhythm.     Comments: To palpate pedal pulses due to body habitus and edema Pulmonary:     Effort: Pulmonary effort is normal.     Breath sounds: Normal breath sounds.  Musculoskeletal: Normal range of motion.        General: Swelling (Right lower extremity: Moderate nonpitting edema noted.  Fibr ) present.  Skin:    Comments: See above  Neurological:     General: No focal deficit present.     Mental Status: He is alert and oriented to person, place, and time. Mental status is at baseline.  Psychiatric:        Mood and Affect: Mood normal.        Behavior: Behavior normal.        Thought Content: Thought content normal.  Judgment: Judgment normal.    BP 136/80 (BP Location: Left Arm, Patient Position: Sitting, Cuff Size: Large)   Pulse 67   Resp 14   Ht 5\' 9"  (1.753 m)   Wt 290 lb (131.5 kg)   BMI 42.83 kg/m    Past Medical History:  Diagnosis Date  . Arthritis   . Asthma   . Cancer ([place])   . CHF (congestive heart failure) ([place])   . Diabetes mellitus without complication ([place])   . Gout   . Hyperlipemia   . Hypertension   . Osteoarthritis   . Stroke [facility])    Social History   Socioeconomic History  . Marital status: Married    Spouse name: Not on file  . Number of children: Not on file  . Years of  education: Not on file  . Highest education level: Not on file  Occupational History  . Not on file  Social Needs  . Financial resource strain: Not on file  . Food insecurity:    Worry: Not on file    Inability: Not on file  . Transportation needs:    Medical: Not on file    Non-medical: Not on file  Tobacco Use  . Smoking status: Former Research scientist (life sciences)  . Smokeless tobacco: Never Used  Substance and Sexual Activity  . Alcohol use: No  . Drug use: No  . Sexual activity: Not on file  Lifestyle  . Physical activity:    Days per week: Not on file    Minutes per session: Not on file  . Stress: Not on file  Relationships  . Social connections:    Talks on phone: Not on file    Gets together: Not on file    Attends religious service: Not on file    Active member of club or organization: Not on file    Attends meetings of clubs or organizations: Not on file    Relationship status: Not on file  . Intimate partner violence:    Fear of current or ex partner: Not on file    Emotionally abused: Not on file    Physically abused: Not on file    Forced sexual activity: Not on file  Other Topics Concern  . Not on file  Social History Narrative  . Not on file   Past Surgical History:  Procedure Laterality Date  . CHOLECYSTECTOMY     Family History  Family history unknown: Yes   Allergies  Allergen Reactions  . Morphine And Related Other (See Comments)    Pt states that this medication put him in a coma.  . Percocet [Oxycodone-Acetaminophen] Other (See Comments)    Pt states that this medication put him in a coma.  . Bactrim [Sulfamethoxazole-Trimethoprim] Other (See Comments)    Reaction:  Unknown   . Benadryl [Diphenhydramine Hcl] Hives  . Keflex [Cephalexin] Hives  . Penicillins Hives and Other (See Comments)    Has patient had a PCN reaction causing immediate rash, facial/tongue/throat swelling, SOB or lightheadedness with hypotension: No Has patient had a PCN reaction causing  severe rash involving mucus membranes or skin necrosis: No Has patient had a PCN reaction that required hospitalization No Has patient had a PCN reaction occurring within the last 10 years: No If all of the above answers are "NO", then may proceed with Cephalosporin use.      Assessment & Plan:   Patient was initially seen as a consult at [facility] for bilateral lower extremity edema  and cellulitis.  During his inpatient stay, the wound nurse was consulted to apply three layer zinc oxide Unna wraps however refused without an ABI.  The [facility]'s ultrasound department was unable to obtain an ABI during his inpatient stay.  The patient presents today to review vascular studies and start his outpatient work-up for bilateral lower extremity edema.  The patient is currently at rehab however will be transitioning to a nursing home October 17, 2018.  The patient notes a longstanding history of bilateral lower extremity edema.  The patient does not engage in conservative therapy on a daily basis.  The patient has been treated with Unna wraps in the past.  The patient had multiple and recurrent bouts of cellulitis.  The patient has experienced ulcerations due to his edema to the bilateral legs in the past.  The symptoms to his right lower extremity are worse than his left lower extremity.  Patient underwent a bilateral ABI which was notable for: Right: 1.72, biphasic anterior tibial artery, triphasic posterior tibial artery with normal great toe waveforms. Left: 1.37, biphasic anterior tibial artery and posterior tibial artery with normal great toe waveforms. Patient denies any fever, nausea vomiting.  1. Chronic diastolic CHF (congestive heart failure) (HCC) - Stable Attributing factor to the patient's bilateral lower extremity edema  2. Type 2 diabetes mellitus with other specified complication, unspecified whether long term insulin use (HCC) - Stable Encouraged  good control as this is a major risk factor for atherosclerotic disease and will slow down wound healing  3. Hyperlipidemia, unspecified hyperlipidemia type - Stable Encouraged good control as this is a major risk factor for atherosclerotic disease  4. Bilateral lower extremity edema - Stable Patient underwent bilateral ABI today which was greater than 0.5 -moving forward there is no contraindication to placing this patient in [place] wraps. The patient would actually greatly benefit from 3 layer zinc oxide Unna wraps in an attempt to gain control of his edema and allow his skin to heal. These bilateral Unna wraps were placed today in our office The patient should have them changed weekly for approximately 1 month I will see the patient back in 1 month to assess his progress with Unna boot therapy and have him undergo bilateral lower extremity venous duplex to rule out reflux The patient was encouraged to elevate his legs heart level or higher.  He expresses understanding The patient may be a candidate for lymphedema pump in the future Once the patient's edema is under control I can transition him into medical grade 1 compression socks in the future The patient and his family member who was present at this appointment are in agreement with the plan  - VAS [place] LOWER EXTREMITY VENOUS REFLUX; Future  Current Outpatient Medications on File Prior to Visit  Medication Sig Dispense Refill  . acetaminophen (TYLENOL) 650 MG CR tablet Take 650 mg by mouth 2 (two) times daily.     Marland Kitchen albuterol (PROVENTIL) (2.5 MG/3ML) 0.083% nebulizer solution Take 2.5 mg by nebulization 4 (four) times daily as needed for wheezing or shortness of breath.    Marland Kitchen amLODipine (NORVASC) 10 MG tablet Take 1 tablet (10 mg total) by mouth at bedtime. 30 tablet 0  . ARIPiprazole (ABILIFY) 5 MG tablet Take 5 mg by mouth daily.     Marland Kitchen aspirin EC 81 MG tablet Take 81 mg by mouth daily.    Marland Kitchen atorvastatin (LIPITOR) 80 MG tablet Take 80 mg  by mouth at bedtime.    Marland Kitchen  bimatoprost (LUMIGAN) 0.01 % SOLN Place 1 drop into both eyes at bedtime.    . brimonidine-timolol (COMBIGAN) 0.2-0.5 % ophthalmic solution Place 1 drop into both eyes 2 (two) times daily.    . diclofenac sodium (VOLTAREN) 1 % GEL Apply 2-4 g topically 2 (two) times daily. Apply 2g to right shoulder and 4g to right knee    . divalproex (DEPAKOTE ER) 500 MG 24 hr tablet Take 1,000 mg by mouth 2 (two) times daily.    . fluticasone (FLONASE) 50 MCG/ACT nasal spray Place 1 spray into both nostrils daily.    . Fluticasone-Salmeterol (ADVAIR) 250-50 MCG/DOSE AEPB Inhale 1 puff into the lungs 2 (two) times daily.    . furosemide (LASIX) 40 MG tablet Take 40 mg by mouth daily.     Marland Kitchen gabapentin (NEURONTIN) 300 MG capsule Take 600 mg by mouth at bedtime.     . insulin aspart (NOVOLOG) 100 UNIT/ML injection Inject 12-14 Units into the skin 2 (two) times daily with a meal. 12 units with breakfast and 14 units with dinner    . insulin glargine (LANTUS) 100 UNIT/ML injection Inject 10 Units into the skin at bedtime.     Marland Kitchen levothyroxine (SYNTHROID, LEVOTHROID) 50 MCG tablet Take 50 mcg by mouth daily before breakfast.    . lisinopril (PRINIVIL,ZESTRIL) 40 MG tablet Take 40 mg by mouth 2 (two) times daily.     . magnesium oxide (MAG-OX) 400 MG tablet Take 400 mg by mouth daily.    . metoprolol succinate (TOPROL-XL) 25 MG 24 hr tablet Take 25 mg by mouth daily.    . mupirocin ointment (BACTROBAN) 2 % Place 1 application into the nose 2 (two) times daily. 22 g 0  . nitroGLYCERIN (NITROSTAT) 0.4 MG SL tablet Place 0.4 mg under the tongue every 5 (five) minutes as needed for chest pain.    . pantoprazole (PROTONIX) 40 MG tablet Take 1 tablet (40 mg total) by mouth daily. 30 tablet 0  . ranitidine (ZANTAC) 150 MG tablet Take 150 mg by mouth 2 (two) times daily.    Marland Kitchen spironolactone (ALDACTONE) 25 MG tablet Take 25 mg by mouth daily.    Marland Kitchen tiotropium (SPIRIVA) 18 MCG inhalation capsule Place 1  capsule (18 mcg total) into inhaler and inhale daily. 30 capsule 12  . venlafaxine XR (EFFEXOR-XR) 150 MG 24 hr capsule Take 150 mg by mouth daily with breakfast. Take with 75mg  capsule for 225mg     . venlafaxine XR (EFFEXOR-XR) 75 MG 24 hr capsule Take 75 mg by mouth daily. Take with 150mg  capsule for 225mg     . Vitamins A & D (VITAMIN A & D) ointment Apply 1 application topically 3 (three) times daily.    . Zinc Oxide (DESITIN) 13 % CREA Apply 1 application topically 2 (two) times daily. Apply to scrotum    . ipratropium (ATROVENT) 0.03 % nasal spray Place 2 sprays into the nose 3 (three) times daily. 30 mL 0   No current facility-administered medications on file prior to visit.    There are no Patient Instructions on file for this visit. No follow-ups on file.  Tirso Laws A Cortney Beissel, PA-C

## 2018-10-06 ENCOUNTER — Encounter (INDEPENDENT_AMBULATORY_CARE_PROVIDER_SITE_OTHER): Payer: Medicare (Managed Care)

## 2018-10-13 ENCOUNTER — Encounter (INDEPENDENT_AMBULATORY_CARE_PROVIDER_SITE_OTHER): Payer: Medicare (Managed Care)

## 2018-10-20 ENCOUNTER — Encounter (INDEPENDENT_AMBULATORY_CARE_PROVIDER_SITE_OTHER): Payer: Self-pay | Admitting: Vascular Surgery

## 2018-10-20 ENCOUNTER — Ambulatory Visit (INDEPENDENT_AMBULATORY_CARE_PROVIDER_SITE_OTHER): Payer: Medicare Other | Admitting: Vascular Surgery

## 2018-10-20 ENCOUNTER — Ambulatory Visit (INDEPENDENT_AMBULATORY_CARE_PROVIDER_SITE_OTHER): Payer: Medicare Other

## 2018-10-20 VITALS — BP 126/78 | HR 73 | Resp 18 | Ht 69.0 in | Wt 292.0 lb

## 2018-10-20 DIAGNOSIS — R600 Localized edema: Secondary | ICD-10-CM | POA: Diagnosis not present

## 2018-10-20 DIAGNOSIS — I890 Lymphedema, not elsewhere classified: Secondary | ICD-10-CM

## 2018-10-20 DIAGNOSIS — I872 Venous insufficiency (chronic) (peripheral): Secondary | ICD-10-CM

## 2018-10-20 DIAGNOSIS — Z7982 Long term (current) use of aspirin: Secondary | ICD-10-CM

## 2018-10-20 MED ORDER — DOXYCYCLINE HYCLATE 100 MG PO CAPS: 100.0000 mg | ORAL_CAPSULE | Freq: Every day | ORAL | 0 refills | Status: AC

## 2018-10-20 NOTE — Progress Notes (Signed)
Subjective:    Patient ID: Mario Blackburn, male    DOB: 1946-03-19, 73 y.o.   MRN: [id number] Chief Complaint  Patient presents with  . Follow-up    ultrasound follow up and unna check    Patient presents for a monthly bilateral lower extremity unna boot therapy follow up.  The patient has been released from rehab and is now back home.  The patient presents today to undergo a bilateral lower extremity venous reflux study.  The study is notable for: Right: Reflux noted in the right popliteal vein.  Findings consistent with chronic deep vein thrombosis involving the right common femoral vein and right femoral vein.  Findings consistent with chronic right superficial vein thrombosis in the right small saphenous vein. Left: No evidence of deep vein thrombosis.  No indirect evidence of obstruction proximal to the inguinal ligament.  There is no evidence of superficial venous thrombosis.  Of note: The study was very hard to conduct due to body habitus, poor ultrasound/tissue interface and the patient's refusal to do Valsalva.  The patient states that he is only been elevating his legs at night when in bed.  Patient denies any worsening in his discomfort or edema however his bilateral toes have progressively become more painful and red.  He denies any fever, nausea vomiting.  Review of Systems  Constitutional: Negative.   HENT: Negative.   Eyes: Negative.   Respiratory: Negative.   Cardiovascular: Positive for leg swelling.  Gastrointestinal: Negative.   Endocrine: Negative.   Genitourinary: Negative.   Musculoskeletal: Negative.   Skin: Negative.   Allergic/Immunologic: Negative.   Neurological: Negative.   Hematological: Negative.   Psychiatric/Behavioral: Negative.       Objective:   Physical Exam Vitals signs reviewed.  Constitutional:      Appearance: Normal appearance. He is normal weight.  HENT:     Head: Normocephalic and atraumatic.     Right Ear: External ear normal.   Left Ear: External ear normal.     Nose: Nose normal.     Mouth/Throat:     Mouth: Mucous membranes are dry.     Pharynx: Oropharynx is clear.  Eyes:     Extraocular Movements: Extraocular movements intact.     Conjunctiva/sclera: Conjunctivae normal.     Pupils: Pupils are equal, round, and reactive to light.  Neck:     Musculoskeletal: Normal range of motion.  Cardiovascular:     Rate and Rhythm: Normal rate and regular rhythm.     Heart sounds: Normal heart sounds.     Comments: Unable to palpate pedal pulses due to body habitus and edema Pulmonary:     Effort: Pulmonary effort is normal.     Breath sounds: Normal breath sounds.  Musculoskeletal: Normal range of motion.        General: Swelling (Swelling has improved bilaterally however the bilateral toes are cellulitic.) present.  Skin:    Comments: Right lower extremity: There is a 4 cm x 3 cm circular wound on the medial aspect of the foot. Left lower extremity: Ulcerations have healed.  Neurological:     General: No focal deficit present.     Mental Status: He is alert and oriented to person, place, and time. Mental status is at baseline.  Psychiatric:        Mood and Affect: Mood normal.        Behavior: Behavior normal.        Thought Content: Thought content normal.  Judgment: Judgment normal.    BP 126/78 (BP Location: Right Arm)   Pulse 73   Resp 18   Ht 5\' 9"  (1.753 m)   Wt 292 lb (132.5 kg)   BMI 43.12 kg/m    Past Medical History:  Diagnosis Date  . Arthritis   . Asthma   . Cancer ([place])   . CHF (congestive heart failure) ([place])   . Diabetes mellitus without complication ([place])   . Gout   . Hyperlipemia   . Hypertension   . Osteoarthritis   . Stroke [facility])    Social History   Socioeconomic History  . Marital status: Married    Spouse name: Not on file  . Number of children: Not on file  . Years of education: Not on file  . Highest education level: Not on file  Occupational History  . Not  on file  Social Needs  . Financial resource strain: Not on file  . Food insecurity:    Worry: Not on file    Inability: Not on file  . Transportation needs:    Medical: Not on file    Non-medical: Not on file  Tobacco Use  . Smoking status: Former Research scientist (life sciences)  . Smokeless tobacco: Never Used  Substance and Sexual Activity  . Alcohol use: No  . Drug use: No  . Sexual activity: Not on file  Lifestyle  . Physical activity:    Days per week: Not on file    Minutes per session: Not on file  . Stress: Not on file  Relationships  . Social connections:    Talks on phone: Not on file    Gets together: Not on file    Attends religious service: Not on file    Active member of club or organization: Not on file    Attends meetings of clubs or organizations: Not on file    Relationship status: Not on file  . Intimate partner violence:    Fear of current or ex partner: Not on file    Emotionally abused: Not on file    Physically abused: Not on file    Forced sexual activity: Not on file  Other Topics Concern  . Not on file  Social History Narrative  . Not on file   Past Surgical History:  Procedure Laterality Date  . CHOLECYSTECTOMY     Family History  Family history unknown: Yes   Allergies  Allergen Reactions  . Morphine And Related Other (See Comments)    Pt states that this medication put him in a coma.  . Percocet [Oxycodone-Acetaminophen] Other (See Comments)    Pt states that this medication put him in a coma.  . Bactrim [Sulfamethoxazole-Trimethoprim] Other (See Comments)    Reaction:  Unknown   . Benadryl [Diphenhydramine Hcl] Hives  . Keflex [Cephalexin] Hives  . Penicillins Hives and Other (See Comments)    Has patient had a PCN reaction causing immediate rash, facial/tongue/throat swelling, SOB or lightheadedness with hypotension: No Has patient had a PCN reaction causing severe rash involving mucus membranes or skin necrosis: No Has patient had a PCN reaction that  required hospitalization No Has patient had a PCN reaction occurring within the last 10 years: No If all of the above answers are "NO", then may proceed with Cephalosporin use.      Assessment & Plan:   Patient presents for a monthly bilateral lower extremity unna boot therapy follow up.  The patient has been released from rehab and is  now back home.  The patient presents today to undergo a bilateral lower extremity venous reflux study.  The study is notable for: Right: Reflux noted in the right popliteal vein.  Findings consistent with chronic deep vein thrombosis involving the right common femoral vein and right femoral vein.  Findings consistent with chronic right superficial vein thrombosis in the right small saphenous vein. Left: No evidence of deep vein thrombosis.  No indirect evidence of obstruction proximal to the inguinal ligament.  There is no evidence of superficial venous thrombosis.  Of note: The study was very hard to conduct due to body habitus, poor ultrasound/tissue interface and the patient's refusal to do Valsalva.  The patient states that he is only been elevating his legs at night when in bed.  Patient denies any worsening in his discomfort or edema however his bilateral toes have progressively become more painful and red.  He denies any fever, nausea vomiting.  1. Chronic venous insufficiency - New Poor study due to body habitus and lack of compliance. Possible venous reflux noted to the right popliteal vein. The bilateral edema has improved however the patient clearly did not elevate his legs and he presents today with edematous and cellulitic bilateral toes. I will call in doxycycline 100 mg 1 tab by mouth x7 days for the cellulitis The patient and his wife understands that if he starts to experience fever, nausea vomiting he must go the ED for further evaluation He was instructed to elevate his legs constantly over the next 2 days to help drain the toes of fluid. I will  bring him back on Friday and place him in 3 layer zinc oxide Unna boots for another month and attempt to control his edema and help his ulcerations heal Once his ulcerations have healed I can transition him into medical grade 1 compression socks He will most likely be a candidate for lymphedema pump in the future  2. Lymphedema - New As above  3. Chronic DVT - Stable Patient with chronic DVT involving the right common femoral vein and right femoral vein.  Findings consistent with chronic right superficial vein thrombosis in the right small saphenous vein. Since the DVT and SVT are chronic systemic anticoagulation is not warranted. Patient is already on an aspirin  Current Outpatient Medications on File Prior to Visit  Medication Sig Dispense Refill  . acetaminophen (TYLENOL) 650 MG CR tablet Take 650 mg by mouth 2 (two) times daily.     Marland Kitchen albuterol (PROVENTIL) (2.5 MG/3ML) 0.083% nebulizer solution Take 2.5 mg by nebulization 4 (four) times daily as needed for wheezing or shortness of breath.    Marland Kitchen amLODipine (NORVASC) 10 MG tablet Take 1 tablet (10 mg total) by mouth at bedtime. 30 tablet 0  . ARIPiprazole (ABILIFY) 5 MG tablet Take 5 mg by mouth daily.     Marland Kitchen aspirin EC 81 MG tablet Take 81 mg by mouth daily.    Marland Kitchen atorvastatin (LIPITOR) 80 MG tablet Take 80 mg by mouth at bedtime.    . bimatoprost (LUMIGAN) 0.01 % SOLN Place 1 drop into both eyes at bedtime.    . brimonidine-timolol (COMBIGAN) 0.2-0.5 % ophthalmic solution Place 1 drop into both eyes 2 (two) times daily.    . diclofenac sodium (VOLTAREN) 1 % GEL Apply 2-4 g topically 2 (two) times daily. Apply 2g to right shoulder and 4g to right knee    . divalproex (DEPAKOTE ER) 500 MG 24 hr tablet Take 1,000 mg by mouth 2 (two)  times daily.    . fluticasone (FLONASE) 50 MCG/ACT nasal spray Place 1 spray into both nostrils daily.    . Fluticasone-Salmeterol (ADVAIR) 250-50 MCG/DOSE AEPB Inhale 1 puff into the lungs 2 (two) times daily.      . furosemide (LASIX) 40 MG tablet Take 40 mg by mouth daily.     Marland Kitchen gabapentin (NEURONTIN) 300 MG capsule Take 600 mg by mouth at bedtime.     . insulin aspart (NOVOLOG) 100 UNIT/ML injection Inject 12-14 Units into the skin 2 (two) times daily with a meal. 12 units with breakfast and 14 units with dinner    . insulin glargine (LANTUS) 100 UNIT/ML injection Inject 10 Units into the skin at bedtime.     Marland Kitchen levothyroxine (SYNTHROID, LEVOTHROID) 50 MCG tablet Take 50 mcg by mouth daily before breakfast.    . lisinopril (PRINIVIL,ZESTRIL) 40 MG tablet Take 40 mg by mouth 2 (two) times daily.     . magnesium oxide (MAG-OX) 400 MG tablet Take 400 mg by mouth daily.    . metoprolol succinate (TOPROL-XL) 25 MG 24 hr tablet Take 25 mg by mouth daily.    . mupirocin ointment (BACTROBAN) 2 % Place 1 application into the nose 2 (two) times daily. 22 g 0  . nitroGLYCERIN (NITROSTAT) 0.4 MG SL tablet Place 0.4 mg under the tongue every 5 (five) minutes as needed for chest pain.    . pantoprazole (PROTONIX) 40 MG tablet Take 1 tablet (40 mg total) by mouth daily. 30 tablet 0  . ranitidine (ZANTAC) 150 MG tablet Take 150 mg by mouth 2 (two) times daily.    Marland Kitchen spironolactone (ALDACTONE) 25 MG tablet Take 25 mg by mouth daily.    Marland Kitchen tiotropium (SPIRIVA) 18 MCG inhalation capsule Place 1 capsule (18 mcg total) into inhaler and inhale daily. 30 capsule 12  . venlafaxine XR (EFFEXOR-XR) 150 MG 24 hr capsule Take 150 mg by mouth daily with breakfast. Take with 75mg  capsule for 225mg     . venlafaxine XR (EFFEXOR-XR) 75 MG 24 hr capsule Take 75 mg by mouth daily. Take with 150mg  capsule for 225mg     . Vitamins A & D (VITAMIN A & D) ointment Apply 1 application topically 3 (three) times daily.    . Zinc Oxide (DESITIN) 13 % CREA Apply 1 application topically 2 (two) times daily. Apply to scrotum    . ipratropium (ATROVENT) 0.03 % nasal spray Place 2 sprays into the nose 3 (three) times daily. 30 mL 0   No current  facility-administered medications on file prior to visit.    There are no Patient Instructions on file for this visit. No follow-ups on file.  Marji Kuehnel A Channin Agustin, PA-C

## 2018-10-21 ENCOUNTER — Telehealth (INDEPENDENT_AMBULATORY_CARE_PROVIDER_SITE_OTHER): Payer: Self-pay

## 2018-10-21 NOTE — Telephone Encounter (Signed)
Spoke with the patients wife to let her know that homehealth had been set up with [facility] and they will be coming out starting Friday 10/22/2018 to place unna boots with the patient. Patient's wife stated he will not lift his leg to help at all. I advised that with his appts that she may need to have someone with them that can help lift him.

## 2018-10-22 ENCOUNTER — Encounter (INDEPENDENT_AMBULATORY_CARE_PROVIDER_SITE_OTHER): Payer: Medicare Other

## 2018-10-27 ENCOUNTER — Telehealth (INDEPENDENT_AMBULATORY_CARE_PROVIDER_SITE_OTHER): Payer: Self-pay | Admitting: Vascular Surgery

## 2018-10-27 NOTE — Telephone Encounter (Signed)
Felicia from [facility] home health calling stated she went to patient house today. Reports that patients R foot very swollen and red. Drainage coming from toes-white/yellow in color and that the toes are cold.   Please advise what to do. AS, CMA

## 2018-10-28 NOTE — Telephone Encounter (Signed)
Spoke with Arna Medici. She wanted me to ask the home health nurse if the drainage on the toes was coming from the toes or were the bandages on the legs saturated as well. She is concerned that it could be urine. Also wanted to know if the patient was elevating his legs.  Spoke with [facility] health nurse. She states that the patient is not elevating his legs. That the patient is sleeping with his legs down in his wheel chair because he does not like the bed. She has educated him on the fact that if he continues to not elevate his legs they are going to continue to swell.  Also, she said that the drainage very well could be urine. The patient says that he is continent but that he does smell of urine. He does have a slit in his toe where his legs are swollen and macerated. But that this is because the patient is non-compliant with care.   Arna Medici is aware of this and requested that the home health nurse reiterate that patient needs to elevate legs and keep dressings clean. If not then the legs could worsen or even turn into cellulitis.  Solmon Ice is aware and said she would continue home health and patient education. AS, CMA

## 2018-11-02 ENCOUNTER — Other Ambulatory Visit: Payer: Self-pay

## 2018-11-02 ENCOUNTER — Emergency Department: Payer: Medicare Other

## 2018-11-02 ENCOUNTER — Inpatient Hospital Stay
Admission: EM | Admit: 2018-11-02 | Discharge: 2018-11-04 | DRG: 603 | Disposition: A | Discharge status: Home Health | Payer: Medicare Other | Attending: Internal Medicine | Admitting: Internal Medicine

## 2018-11-02 DIAGNOSIS — Z9119 Patient's noncompliance with other medical treatment and regimen: Secondary | ICD-10-CM

## 2018-11-02 DIAGNOSIS — I890 Lymphedema, not elsewhere classified: Secondary | ICD-10-CM | POA: Diagnosis present

## 2018-11-02 DIAGNOSIS — E039 Hypothyroidism, unspecified: Secondary | ICD-10-CM | POA: Diagnosis present

## 2018-11-02 DIAGNOSIS — I82811 Embolism and thrombosis of superficial veins of right lower extremities: Secondary | ICD-10-CM | POA: Diagnosis present

## 2018-11-02 DIAGNOSIS — M7989 Other specified soft tissue disorders: Secondary | ICD-10-CM | POA: Diagnosis not present

## 2018-11-02 DIAGNOSIS — E785 Hyperlipidemia, unspecified: Secondary | ICD-10-CM | POA: Diagnosis present

## 2018-11-02 DIAGNOSIS — I872 Venous insufficiency (chronic) (peripheral): Secondary | ICD-10-CM | POA: Diagnosis present

## 2018-11-02 DIAGNOSIS — Z87891 Personal history of nicotine dependence: Secondary | ICD-10-CM

## 2018-11-02 DIAGNOSIS — I110 Hypertensive heart disease with heart failure: Secondary | ICD-10-CM | POA: Diagnosis present

## 2018-11-02 DIAGNOSIS — M1990 Unspecified osteoarthritis, unspecified site: Secondary | ICD-10-CM | POA: Diagnosis present

## 2018-11-02 DIAGNOSIS — J449 Chronic obstructive pulmonary disease, unspecified: Secondary | ICD-10-CM | POA: Diagnosis present

## 2018-11-02 DIAGNOSIS — Z66 Do not resuscitate: Secondary | ICD-10-CM | POA: Diagnosis present

## 2018-11-02 DIAGNOSIS — Z79890 Hormone replacement therapy (postmenopausal): Secondary | ICD-10-CM

## 2018-11-02 DIAGNOSIS — Z6841 Body Mass Index (BMI) 40.0 and over, adult: Secondary | ICD-10-CM | POA: Diagnosis not present

## 2018-11-02 DIAGNOSIS — Z7401 Bed confinement status: Secondary | ICD-10-CM

## 2018-11-02 DIAGNOSIS — Z888 Allergy status to other drugs, medicaments and biological substances status: Secondary | ICD-10-CM

## 2018-11-02 DIAGNOSIS — L03115 Cellulitis of right lower limb: Secondary | ICD-10-CM

## 2018-11-02 DIAGNOSIS — L905 Scar conditions and fibrosis of skin: Secondary | ICD-10-CM | POA: Diagnosis present

## 2018-11-02 DIAGNOSIS — Z794 Long term (current) use of insulin: Secondary | ICD-10-CM

## 2018-11-02 DIAGNOSIS — L03116 Cellulitis of left lower limb: Secondary | ICD-10-CM | POA: Diagnosis present

## 2018-11-02 DIAGNOSIS — L0390 Cellulitis, unspecified: Secondary | ICD-10-CM | POA: Diagnosis present

## 2018-11-02 DIAGNOSIS — Z885 Allergy status to narcotic agent status: Secondary | ICD-10-CM

## 2018-11-02 DIAGNOSIS — R609 Edema, unspecified: Secondary | ICD-10-CM | POA: Diagnosis not present

## 2018-11-02 DIAGNOSIS — Z881 Allergy status to other antibiotic agents status: Secondary | ICD-10-CM

## 2018-11-02 DIAGNOSIS — Z791 Long term (current) use of non-steroidal anti-inflammatories (NSAID): Secondary | ICD-10-CM | POA: Diagnosis not present

## 2018-11-02 DIAGNOSIS — I82501 Chronic embolism and thrombosis of unspecified deep veins of right lower extremity: Secondary | ICD-10-CM | POA: Diagnosis present

## 2018-11-02 DIAGNOSIS — Z7982 Long term (current) use of aspirin: Secondary | ICD-10-CM

## 2018-11-02 DIAGNOSIS — Z79899 Other long term (current) drug therapy: Secondary | ICD-10-CM

## 2018-11-02 DIAGNOSIS — F0390 Unspecified dementia without behavioral disturbance: Secondary | ICD-10-CM | POA: Diagnosis present

## 2018-11-02 DIAGNOSIS — Z7951 Long term (current) use of inhaled steroids: Secondary | ICD-10-CM

## 2018-11-02 DIAGNOSIS — I5032 Chronic diastolic (congestive) heart failure: Secondary | ICD-10-CM | POA: Diagnosis present

## 2018-11-02 DIAGNOSIS — Z882 Allergy status to sulfonamides status: Secondary | ICD-10-CM

## 2018-11-02 DIAGNOSIS — Z8673 Personal history of transient ischemic attack (TIA), and cerebral infarction without residual deficits: Secondary | ICD-10-CM

## 2018-11-02 DIAGNOSIS — E119 Type 2 diabetes mellitus without complications: Secondary | ICD-10-CM | POA: Diagnosis present

## 2018-11-02 DIAGNOSIS — Z880 Allergy status to penicillin: Secondary | ICD-10-CM

## 2018-11-02 DIAGNOSIS — I82511 Chronic embolism and thrombosis of right femoral vein: Secondary | ICD-10-CM | POA: Diagnosis not present

## 2018-11-02 LAB — CBC WITH DIFFERENTIAL/PLATELET
Abs Immature Granulocytes: 0.04 10*3/uL (ref 0.00–0.07)
Basophils Absolute: 0 10*3/uL (ref 0.0–0.1)
Basophils Relative: 0 %
EOS ABS: 0.7 10*3/uL — AB (ref 0.0–0.5)
Eosinophils Relative: 6 %
HCT: 37.5 % — ABNORMAL LOW (ref 39.0–52.0)
Hemoglobin: 12.1 g/dL — ABNORMAL LOW (ref 13.0–17.0)
Immature Granulocytes: 0 %
Lymphocytes Relative: 34 %
Lymphs Abs: 3.6 10*3/uL (ref 0.7–4.0)
MCH: 28.7 pg (ref 26.0–34.0)
MCHC: 32.3 g/dL (ref 30.0–36.0)
MCV: 88.9 fL (ref 80.0–100.0)
MONOS PCT: 13 %
Monocytes Absolute: 1.4 10*3/uL — ABNORMAL HIGH (ref 0.1–1.0)
NEUTROS PCT: 47 %
Neutro Abs: 5 10*3/uL (ref 1.7–7.7)
Platelets: 261 10*3/uL (ref 150–400)
RBC: 4.22 MIL/uL (ref 4.22–5.81)
RDW: 15.7 % — AB (ref 11.5–15.5)
WBC: 10.8 10*3/uL — ABNORMAL HIGH (ref 4.0–10.5)
nRBC: 0 % (ref 0.0–0.2)

## 2018-11-02 LAB — COMPREHENSIVE METABOLIC PANEL
ALT: 13 U/L (ref 0–44)
AST: 19 U/L (ref 15–41)
Albumin: 3.3 g/dL — ABNORMAL LOW (ref 3.5–5.0)
Alkaline Phosphatase: 56 U/L (ref 38–126)
Anion gap: 10 (ref 5–15)
BUN: 18 mg/dL (ref 8–23)
CALCIUM: 8.8 mg/dL — AB (ref 8.9–10.3)
CO2: 27 mmol/L (ref 22–32)
Chloride: 101 mmol/L (ref 98–111)
Creatinine, Ser: 0.79 mg/dL (ref 0.61–1.24)
GFR calc Af Amer: >60 mL/min (ref >60)
GFR calc non Af Amer: >60 mL/min (ref >60)
Glucose, Bld: 137 mg/dL — ABNORMAL HIGH (ref 70–99)
Potassium: 4 mmol/L (ref 3.5–5.1)
Sodium: 138 mmol/L (ref 135–145)
Total Bilirubin: 0.5 mg/dL (ref 0.3–1.2)
Total Protein: 7 g/dL (ref 6.5–8.1)

## 2018-11-02 LAB — MRSA PCR SCREENING: MRSA by PCR: POSITIVE — AB

## 2018-11-02 LAB — HEMOGLOBIN A1C
Hgb A1c MFr Bld: 7 % — ABNORMAL HIGH (ref 4.8–5.6)
Mean Plasma Glucose: 154.2 mg/dL

## 2018-11-02 LAB — GLUCOSE, CAPILLARY
GLUCOSE-CAPILLARY: 90 mg/dL (ref 70–99)
Glucose-Capillary: 176 mg/dL — ABNORMAL HIGH (ref 70–99)

## 2018-11-02 LAB — LACTIC ACID, PLASMA
Lactic Acid, Venous: 2.8 mmol/L (ref 0.5–1.9)
Lactic Acid, Venous: 1.4 mmol/L (ref 0.5–1.9)

## 2018-11-02 MED ORDER — TRAMADOL HCL 50 MG PO TABS
50.0000 mg | ORAL_TABLET | Freq: Four times a day (QID) | ORAL | Status: DC | PRN
  Administered 2018-11-03 (×2): 50 mg via ORAL
  Filled 2018-11-02 (×2): qty 1

## 2018-11-02 MED ORDER — INSULIN ASPART 100 UNIT/ML ~~LOC~~ SOLN: 0.0000 [IU] | Freq: Every day | SUBCUTANEOUS | Status: DC

## 2018-11-02 MED ORDER — INSULIN GLARGINE 100 UNIT/ML ~~LOC~~ SOLN
10.0000 [IU] | Freq: Every day | SUBCUTANEOUS | Status: DC
  Administered 2018-11-02 – 2018-11-03 (×2): 10 [IU] via SUBCUTANEOUS
  Filled 2018-11-02 (×3): qty 0.1

## 2018-11-02 MED ORDER — TIMOLOL MALEATE 0.5 % OP SOLN
1.0000 [drp] | Freq: Two times a day (BID) | OPHTHALMIC | Status: DC
  Administered 2018-11-02 – 2018-11-04 (×4): 1 [drp] via OPHTHALMIC
  Filled 2018-11-02: qty 5

## 2018-11-02 MED ORDER — DIVALPROEX SODIUM ER 500 MG PO TB24
1000.0000 mg | ORAL_TABLET | Freq: Two times a day (BID) | ORAL | Status: DC
  Administered 2018-11-02 – 2018-11-04 (×4): 1000 mg via ORAL
  Filled 2018-11-02 (×5): qty 2

## 2018-11-02 MED ORDER — TIOTROPIUM BROMIDE MONOHYDRATE 18 MCG IN CAPS
18.0000 ug | ORAL_CAPSULE | Freq: Every day | RESPIRATORY_TRACT | Status: DC
  Administered 2018-11-03 – 2018-11-04 (×2): 18 ug via RESPIRATORY_TRACT
  Filled 2018-11-02: qty 5

## 2018-11-02 MED ORDER — ASPIRIN EC 81 MG PO TBEC
81.0000 mg | DELAYED_RELEASE_TABLET | Freq: Every day | ORAL | Status: DC
  Administered 2018-11-03 – 2018-11-04 (×2): 81 mg via ORAL
  Filled 2018-11-02 (×2): qty 1

## 2018-11-02 MED ORDER — CHLORHEXIDINE GLUCONATE CLOTH 2 % EX PADS
6.0000 | MEDICATED_PAD | Freq: Every day | CUTANEOUS | Status: DC
  Administered 2018-11-03: 6 via TOPICAL

## 2018-11-02 MED ORDER — SPIRONOLACTONE 25 MG PO TABS
25.0000 mg | ORAL_TABLET | Freq: Every day | ORAL | Status: DC
  Administered 2018-11-03 – 2018-11-04 (×2): 25 mg via ORAL
  Filled 2018-11-02 (×2): qty 1

## 2018-11-02 MED ORDER — FUROSEMIDE 40 MG PO TABS
40.0000 mg | ORAL_TABLET | Freq: Every day | ORAL | Status: DC
  Administered 2018-11-03 – 2018-11-04 (×2): 40 mg via ORAL
  Filled 2018-11-02 (×2): qty 1

## 2018-11-02 MED ORDER — VANCOMYCIN HCL 10 G IV SOLR
2500.0000 mg | Freq: Once | INTRAVENOUS | Status: AC
  Administered 2018-11-02: 2500 mg via INTRAVENOUS
  Filled 2018-11-02: qty 2500

## 2018-11-02 MED ORDER — BRIMONIDINE TARTRATE 0.2 % OP SOLN
1.0000 [drp] | Freq: Two times a day (BID) | OPHTHALMIC | Status: DC
  Administered 2018-11-02 – 2018-11-04 (×4): 1 [drp] via OPHTHALMIC
  Filled 2018-11-02: qty 5

## 2018-11-02 MED ORDER — PANTOPRAZOLE SODIUM 40 MG PO TBEC
40.0000 mg | DELAYED_RELEASE_TABLET | Freq: Every day | ORAL | Status: DC
  Administered 2018-11-03 – 2018-11-04 (×2): 40 mg via ORAL
  Filled 2018-11-02 (×2): qty 1

## 2018-11-02 MED ORDER — ONDANSETRON HCL 4 MG PO TABS: 4.0000 mg | ORAL_TABLET | Freq: Four times a day (QID) | ORAL | Status: DC | PRN

## 2018-11-02 MED ORDER — METOPROLOL SUCCINATE ER 25 MG PO TB24
25.0000 mg | ORAL_TABLET | Freq: Every day | ORAL | Status: DC
  Administered 2018-11-03 – 2018-11-04 (×2): 25 mg via ORAL
  Filled 2018-11-02 (×2): qty 1

## 2018-11-02 MED ORDER — LATANOPROST 0.005 % OP SOLN
1.0000 [drp] | Freq: Every day | OPHTHALMIC | Status: DC
  Administered 2018-11-02 – 2018-11-03 (×2): 1 [drp] via OPHTHALMIC
  Filled 2018-11-02: qty 2.5

## 2018-11-02 MED ORDER — ALBUTEROL SULFATE (2.5 MG/3ML) 0.083% IN NEBU: 2.5000 mg | INHALATION_SOLUTION | RESPIRATORY_TRACT | Status: DC | PRN

## 2018-11-02 MED ORDER — LISINOPRIL 20 MG PO TABS
40.0000 mg | ORAL_TABLET | Freq: Two times a day (BID) | ORAL | Status: DC
  Administered 2018-11-02 – 2018-11-04 (×4): 40 mg via ORAL
  Filled 2018-11-02 (×4): qty 2

## 2018-11-02 MED ORDER — VANCOMYCIN HCL IN DEXTROSE 1-5 GM/200ML-% IV SOLN
1000.0000 mg | Freq: Once | INTRAVENOUS | Status: DC
  Filled 2018-11-02: qty 200

## 2018-11-02 MED ORDER — GABAPENTIN 300 MG PO CAPS
600.0000 mg | ORAL_CAPSULE | Freq: Every day | ORAL | Status: DC
  Administered 2018-11-02 – 2018-11-03 (×2): 600 mg via ORAL
  Filled 2018-11-02 (×2): qty 2

## 2018-11-02 MED ORDER — ONDANSETRON HCL 4 MG/2ML IJ SOLN: 4.0000 mg | Freq: Four times a day (QID) | INTRAMUSCULAR | Status: DC | PRN

## 2018-11-02 MED ORDER — MAGNESIUM OXIDE 400 (241.3 MG) MG PO TABS
600.0000 mg | ORAL_TABLET | Freq: Every day | ORAL | Status: DC
  Administered 2018-11-03 – 2018-11-04 (×2): 600 mg via ORAL
  Filled 2018-11-02 (×2): qty 2

## 2018-11-02 MED ORDER — INSULIN ASPART 100 UNIT/ML ~~LOC~~ SOLN
0.0000 [IU] | Freq: Three times a day (TID) | SUBCUTANEOUS | Status: DC
  Administered 2018-11-03 – 2018-11-04 (×2): 2 [IU] via SUBCUTANEOUS
  Filled 2018-11-02 (×2): qty 1

## 2018-11-02 MED ORDER — LEVOTHYROXINE SODIUM 50 MCG PO TABS
50.0000 ug | ORAL_TABLET | Freq: Every day | ORAL | Status: DC
  Administered 2018-11-03 – 2018-11-04 (×2): 50 ug via ORAL
  Filled 2018-11-02 (×2): qty 1

## 2018-11-02 MED ORDER — ARIPIPRAZOLE 5 MG PO TABS
5.0000 mg | ORAL_TABLET | Freq: Every day | ORAL | Status: DC
  Administered 2018-11-03 – 2018-11-04 (×2): 5 mg via ORAL
  Filled 2018-11-02 (×2): qty 1

## 2018-11-02 MED ORDER — MUPIROCIN 2 % EX OINT
1.0000 "application " | TOPICAL_OINTMENT | Freq: Two times a day (BID) | CUTANEOUS | Status: DC
  Administered 2018-11-02 – 2018-11-04 (×4): 1 via NASAL
  Filled 2018-11-02: qty 22

## 2018-11-02 MED ORDER — VANCOMYCIN HCL 10 G IV SOLR
1250.0000 mg | Freq: Two times a day (BID) | INTRAVENOUS | Status: DC
  Administered 2018-11-03 – 2018-11-04 (×3): 1250 mg via INTRAVENOUS
  Filled 2018-11-02 (×5): qty 1250

## 2018-11-02 MED ORDER — MOMETASONE FURO-FORMOTEROL FUM 200-5 MCG/ACT IN AERO
2.0000 | INHALATION_SPRAY | Freq: Two times a day (BID) | RESPIRATORY_TRACT | Status: DC
  Administered 2018-11-02 – 2018-11-04 (×4): 2 via RESPIRATORY_TRACT
  Filled 2018-11-02: qty 8.8

## 2018-11-02 MED ORDER — FLUTICASONE PROPIONATE 50 MCG/ACT NA SUSP
1.0000 | Freq: Every day | NASAL | Status: DC
  Administered 2018-11-03 – 2018-11-04 (×2): 1 via NASAL
  Filled 2018-11-02: qty 16

## 2018-11-02 MED ORDER — NITROGLYCERIN 0.4 MG SL SUBL: 0.4000 mg | SUBLINGUAL_TABLET | SUBLINGUAL | Status: DC | PRN

## 2018-11-02 MED ORDER — BACITRACIN-NEOMYCIN-POLYMYXIN 400-5-5000 EX OINT
TOPICAL_OINTMENT | CUTANEOUS | Status: AC
  Administered 2018-11-02: 3
  Filled 2018-11-02: qty 3

## 2018-11-02 MED ORDER — ATORVASTATIN CALCIUM 20 MG PO TABS
80.0000 mg | ORAL_TABLET | Freq: Every day | ORAL | Status: DC
  Administered 2018-11-02 – 2018-11-03 (×2): 80 mg via ORAL
  Filled 2018-11-02 (×2): qty 4

## 2018-11-02 MED ORDER — ACETAMINOPHEN 650 MG RE SUPP: 650.0000 mg | Freq: Four times a day (QID) | RECTAL | Status: DC | PRN

## 2018-11-02 MED ORDER — ACETAMINOPHEN 325 MG PO TABS
650.0000 mg | ORAL_TABLET | Freq: Four times a day (QID) | ORAL | Status: DC | PRN
  Administered 2018-11-02 – 2018-11-04 (×3): 650 mg via ORAL
  Filled 2018-11-02 (×3): qty 2

## 2018-11-02 MED ORDER — POLYETHYLENE GLYCOL 3350 17 G PO PACK: 17.0000 g | PACK | Freq: Every day | ORAL | Status: DC | PRN

## 2018-11-02 MED ORDER — VENLAFAXINE HCL ER 75 MG PO CP24
150.0000 mg | ORAL_CAPSULE | Freq: Every day | ORAL | Status: DC
  Administered 2018-11-03: 150 mg via ORAL
  Filled 2018-11-02: qty 2

## 2018-11-02 MED ORDER — BRIMONIDINE TARTRATE-TIMOLOL 0.2-0.5 % OP SOLN
1.0000 [drp] | Freq: Two times a day (BID) | OPHTHALMIC | Status: DC
  Filled 2018-11-02: qty 5

## 2018-11-02 MED ORDER — ENOXAPARIN SODIUM 40 MG/0.4ML ~~LOC~~ SOLN
40.0000 mg | SUBCUTANEOUS | Status: DC
  Administered 2018-11-02: 40 mg via SUBCUTANEOUS
  Filled 2018-11-02: qty 0.4

## 2018-11-02 NOTE — Consult Note (Signed)
Pharmacy Antibiotic Note  Mario Blackburn is a 73 y.o. male admitted on 11/02/2018 with cellulitis.  Pharmacy has been consulted for vancomycin dosing.  Plan: Vancomycin 1250 mg IV every 12 hours following a loading dose of 2,500 mg. AUC goal 400-550 Calculated AUC: 491.8 Calculated Css: 14.2 Scr: 0.79 mg/dL  Height: 5\' 9"  (175.3 cm) Weight: 300 lb (136.1 kg) IBW/kg (Calculated) : 70.7  Temp (24hrs), Avg:98 F (36.7 C), Min:98 F (36.7 C), Max:98 F (36.7 C)  Recent Labs  Lab 11/02/18 1235 11/02/18 1322  WBC 10.8*  --   CREATININE 0.79  --   LATICACIDVEN  --  2.8*    Estimated Creatinine Clearance: 116.1 mL/min (by C-G formula based on SCr of 0.79 mg/dL).    Allergies  Allergen Reactions  . Morphine And Related Other (See Comments)    Pt states that this medication put him in a coma.  . Percocet [Oxycodone-Acetaminophen] Other (See Comments)    Pt states that this medication put him in a coma.  . Bactrim [Sulfamethoxazole-Trimethoprim] Other (See Comments)    Reaction:  Unknown   . Benadryl [Diphenhydramine Hcl] Hives  . Keflex [Cephalexin] Hives  . Penicillins Hives and Other (See Comments)    Has patient had a PCN reaction causing immediate rash, facial/tongue/throat swelling, SOB or lightheadedness with hypotension: No Has patient had a PCN reaction causing severe rash involving mucus membranes or skin necrosis: No Has patient had a PCN reaction that required hospitalization No Has patient had a PCN reaction occurring within the last 10 years: No If all of the above answers are "NO", then may proceed with Cephalosporin use.    Antimicrobials this admission: Vancomycin 3/17 >>   Dose adjustments this admission:  Microbiology results: 3/17 BCx: pending  Thank you for allowing pharmacy to be a part of this patient's care.  Lance Coon A Aliz Meritt 11/02/2018 4:13 PM

## 2018-11-02 NOTE — ED Notes (Signed)
2nd RN attempted to pull Lactic from line without success.

## 2018-11-02 NOTE — ED Triage Notes (Signed)
Pt has home health nurse who wraps legs. bilat leg swelling and drainage. No recent antibiotics for legs. Legs are wrapped in triage. Redness noted above wraps. A&O, in wheelchair. HOH.

## 2018-11-02 NOTE — ED Notes (Signed)
Attempted for 2nd Lactic by current IV. Won't pull back blood. Pt adamant he doesn't want to be stuck again.

## 2018-11-02 NOTE — Progress Notes (Signed)
Advance care planning  Purpose of Encounter Cellulitis CODE STATUS discussion  Parties in Attendance Patient, wife at bedside  Patients Decisional capacity Patient with dementia.  Unable to make medical decisions. Wife is documented healthcare power of attorney  No advance care planning documents in place.  Wife has not discussed regarding CODE STATUS in the past.  We discussed regarding patient's cellulitis and need for admission, treatment plan and prognosis.  All questions answered  CODE STATUS discussed.  Explained to patient regarding full code versus DNR/DNI.  She asked appropriate questions.  All answered.  Wife would like patient to be DO NOT RESUSCITATE and DO NOT INTUBATE.  Orders entered and CODE STATUS changed  Time spent - 18 minutes

## 2018-11-02 NOTE — H&P (Signed)
[facility] [place] NAME: Mario Blackburn    MR#:  [id number]  DATE OF BIRTH:  12-11-1945  DATE OF ADMISSION:  11/02/2018  PRIMARY CARE PHYSICIAN: Patient, No Pcp Per   REQUESTING/REFERRING PHYSICIAN: Dr. Cherylann Banas  CHIEF COMPLAINT:   Chief Complaint  Patient presents with  . Leg Swelling     HISTORY OF PRESENT ILLNESS:  Mario Blackburn  is a 73 y.o. male with a known history of dementia, hypertension, diabetes, bedbound status, chronic ulcerations on his legs who follows at the wound care center was sent in by his home health nurse due to worsening redness, warmth of bilateral legs and purulent discharge.  Patient has no fever and normal WBC but lactic acid found to be 2.8 with bilateral lower extremity cellulitis and is being admitted on IV antibiotics.  He has allergy to penicillins and cephalosporins. Poor historian.  History obtained from wife at bedside and reviewing old records.  PAST MEDICAL HISTORY:   Past Medical History:  Diagnosis Date  . Arthritis   . Asthma   . Cancer ([place])   . CHF (congestive heart failure) ([place])   . Diabetes mellitus without complication ([place])   . Gout   . Hyperlipemia   . Hypertension   . Osteoarthritis   . Stroke [facility])     PAST SURGICAL HISTORY:   Past Surgical History:  Procedure Laterality Date  . CHOLECYSTECTOMY      SOCIAL HISTORY:   Social History   Tobacco Use  . Smoking status: Former Research scientist (life sciences)  . Smokeless tobacco: Never Used  Substance Use Topics  . Alcohol use: No    FAMILY HISTORY:   Family History  Family history unknown: Yes   Unable to obtain due to patient's dementia  DRUG ALLERGIES:   Allergies  Allergen Reactions  . Morphine And Related Other (See Comments)    Pt states that this medication put him in a coma.  . Percocet [Oxycodone-Acetaminophen] Other (See Comments)    Pt states that this medication put him in a coma.  . Bactrim  [Sulfamethoxazole-Trimethoprim] Other (See Comments)    Reaction:  Unknown   . Benadryl [Diphenhydramine Hcl] Hives  . Keflex [Cephalexin] Hives  . Penicillins Hives and Other (See Comments)    Has patient had a PCN reaction causing immediate rash, facial/tongue/throat swelling, SOB or lightheadedness with hypotension: No Has patient had a PCN reaction causing severe rash involving mucus membranes or skin necrosis: No Has patient had a PCN reaction that required hospitalization No Has patient had a PCN reaction occurring within the last 10 years: No If all of the above answers are "NO", then may proceed with Cephalosporin use.    REVIEW OF SYSTEMS:   Review of Systems  Unable to perform ROS: Dementia    MEDICATIONS AT HOME:   Prior to Admission medications   Medication Sig Start Date End Date Taking? Authorizing Provider  acetaminophen (TYLENOL) 650 MG CR tablet Take 650 mg by mouth 2 (two) times daily.     [provider]  albuterol (PROVENTIL) (2.5 MG/3ML) 0.083% nebulizer solution Take 2.5 mg by nebulization 4 (four) times daily as needed for wheezing or shortness of breath.    [provider]  amLODipine (NORVASC) 10 MG tablet Take 1 tablet (10 mg total) by mouth at bedtime. 09/16/18   Epifanio Lesches, MD  ARIPiprazole (ABILIFY) 5 MG tablet Take 5 mg by mouth daily.     [provider]  aspirin  EC 81 MG tablet Take 81 mg by mouth daily.    [provider]  atorvastatin (LIPITOR) 80 MG tablet Take 80 mg by mouth at bedtime.    [provider]  bimatoprost (LUMIGAN) 0.01 % SOLN Place 1 drop into both eyes at bedtime.    [provider]  brimonidine-timolol (COMBIGAN) 0.2-0.5 % ophthalmic solution Place 1 drop into both eyes 2 (two) times daily.    [provider]  diclofenac sodium (VOLTAREN) 1 % GEL Apply 2-4 g topically 2 (two) times daily. Apply 2g to right shoulder and 4g to right knee    [provider]   divalproex (DEPAKOTE ER) 500 MG 24 hr tablet Take 1,000 mg by mouth 2 (two) times daily.    [provider]  fluticasone (FLONASE) 50 MCG/ACT nasal spray Place 1 spray into both nostrils daily.    [provider]  Fluticasone-Salmeterol (ADVAIR) 250-50 MCG/DOSE AEPB Inhale 1 puff into the lungs 2 (two) times daily.    [provider]  furosemide (LASIX) 40 MG tablet Take 40 mg by mouth daily.     [provider]  gabapentin (NEURONTIN) 300 MG capsule Take 600 mg by mouth at bedtime.     [provider]  insulin aspart (NOVOLOG) 100 UNIT/ML injection Inject 12-14 Units into the skin 2 (two) times daily with a meal. 12 units with breakfast and 14 units with dinner    [provider]  insulin glargine (LANTUS) 100 UNIT/ML injection Inject 10 Units into the skin at bedtime.     [provider]  ipratropium (ATROVENT) 0.03 % nasal spray Place 2 sprays into the nose 3 (three) times daily. 12/06/16 12/06/17  Darel Hong, MD  levothyroxine (SYNTHROID, LEVOTHROID) 50 MCG tablet Take 50 mcg by mouth daily before breakfast.    [provider]  lisinopril (PRINIVIL,ZESTRIL) 40 MG tablet Take 40 mg by mouth 2 (two) times daily.     [provider]  magnesium oxide (MAG-OX) 400 MG tablet Take 400 mg by mouth daily.    [provider]  metoprolol succinate (TOPROL-XL) 25 MG 24 hr tablet Take 25 mg by mouth daily.    [provider]  mupirocin ointment (BACTROBAN) 2 % Place 1 application into the nose 2 (two) times daily. 09/16/18   Epifanio Lesches, MD  nitroGLYCERIN (NITROSTAT) 0.4 MG SL tablet Place 0.4 mg under the tongue every 5 (five) minutes as needed for chest pain.    [provider]  pantoprazole (PROTONIX) 40 MG tablet Take 1 tablet (40 mg total) by mouth daily. 09/17/18   Epifanio Lesches, MD  ranitidine (ZANTAC) 150 MG tablet Take 150 mg by mouth 2 (two) times daily.    [provider]  spironolactone (ALDACTONE) 25 MG tablet Take 25 mg by mouth daily.    [provider]  tiotropium (SPIRIVA) 18 MCG inhalation capsule Place 1 capsule (18 mcg total) into inhaler and inhale daily. 09/17/18   Epifanio Lesches, MD  venlafaxine XR (EFFEXOR-XR) 150 MG 24 hr capsule Take 150 mg by mouth daily with breakfast. Take with 75mg  capsule for 225mg     [provider]  venlafaxine XR (EFFEXOR-XR) 75 MG 24 hr capsule Take 75 mg by mouth daily. Take with 150mg  capsule for 225mg     [provider]  Vitamins A & D (VITAMIN A & D) ointment Apply 1 application topically 3 (three) times daily.    [provider]  Zinc Oxide (DESITIN) 13 % CREA  Apply 1 application topically 2 (two) times daily. Apply to scrotum    [provider]     VITAL SIGNS:  Blood pressure (!) 117/57, pulse 77, temperature 98 F (36.7 C), temperature source Oral, resp. rate 18, height 5\' 9"  (1.753 m), weight 136.1 kg, SpO2 99 %.  PHYSICAL EXAMINATION:  Physical Exam  GENERAL:  73 y.o.-year-old patient lying in the bed with no acute distress.  EYES: Pupils equal, round, reactive to light and accommodation. No scleral icterus. Extraocular muscles intact.  HEENT: Head atraumatic, normocephalic. Oropharynx and nasopharynx clear. No oropharyngeal erythema, moist oral mucosa  NECK:  Supple, no jugular venous distention. No thyroid enlargement, no tenderness.  LUNGS: Normal breath sounds bilaterally, no wheezing, rales, rhonchi. No use of accessory muscles of respiration.  CARDIOVASCULAR: S1, S2 normal. No murmurs, rubs, or gallops.  ABDOMEN: Soft, nontender, nondistended. Bowel sounds present. No organomegaly or mass.  EXTREMITIES: No pedal edema, cyanosis, or clubbing. + 2 pedal & radial pulses b/l.   NEUROLOGIC: Cranial nerves II through XII are intact. No focal Motor or sensory deficits appreciated b/l PSYCHIATRIC: The patient is alert and oriented x 3. Good  affect.  SKIN: No obvious rash, lesion, or ulcer.   LABORATORY PANEL:   CBC Recent Labs  Lab 11/02/18 1235  WBC 10.8*  HGB 12.1*  HCT 37.5*  PLT 261   ------------------------------------------------------------------------------------------------------------------  Chemistries  Recent Labs  Lab 11/02/18 1235  NA 138  K 4.0  CL 101  CO2 27  GLUCOSE 137*  BUN 18  CREATININE 0.79  CALCIUM 8.8*  AST 19  ALT 13  ALKPHOS 56  BILITOT 0.5   ------------------------------------------------------------------------------------------------------------------  Cardiac Enzymes No results for input(s): TROPONINI in the last 168 hours. ------------------------------------------------------------------------------------------------------------------  RADIOLOGY:  Dg Foot 2 Views Right  Result Date: 11/02/2018 CLINICAL DATA:  Bilateral leg swelling and drainage. EXAM: RIGHT FOOT - 2 VIEW COMPARISON:  None. FINDINGS: There is no evidence of fracture or dislocation. Degenerative joint changes with narrowed joint space and osteophyte formation are identified in the first digit. There is no bony destruction to suggest osteomyelitis. IMPRESSION: No plain film evidence of osteomyelitis. Degenerative joint changes of the right foot. Electronically Signed   By: Abelardo Diesel M.D.   On: 11/02/2018 13:46   IMPRESSION AND PLAN:   *Bilateral lower extremity cellulitis with chronic stasis changes Start IV vancomycin which has been ordered and ER. Cultures sent and pending. Wound care consult for daily dressing changes  *Diabetes mellitus.  Home medications and sliding scale insulin  *Hypertension.  Home medications.  *Dementia.  Monitor for inpatient delirium.   *DVT prophylaxis with Lovenox   All the records are reviewed and case discussed with ED provider. Management plans discussed with the patient, family and they are in agreement.  CODE STATUS: DNR/DNI  TOTAL TIME TAKING CARE OF  THIS PATIENT: 35 minutes.   Leia Alf Amirah Goerke M.D on 11/02/2018 at 2:43 PM  Between 7am to 6pm - Pager - [phone]  After 6pm go to [email or web address] - password [email or web address]  Office  [phone]  CC: Primary care physician; Patient, No Pcp Per  Note: This dictation was prepared with Dragon dictation along with smaller phrase technology. Any transcriptional errors that result from this process are unintentional.

## 2018-11-02 NOTE — ED Notes (Signed)
Family remains at bedside. Pt irritable about being stuck again for cultures.

## 2018-11-02 NOTE — ED Notes (Signed)
Bilat leg swelling, redness, malodorous.

## 2018-11-02 NOTE — ED Notes (Signed)
EDP Siadecki cut off leg bandages to observe and assess. Replaced with lighter wraps and neomycin-bac. Pt placed on heart monitor. Family at bedside.

## 2018-11-02 NOTE — ED Notes (Signed)
Stuck pt with butterfly for 2nd set of cultures in R hand. Pt kept moving arm around so it pulled slowly.

## 2018-11-02 NOTE — ED Notes (Signed)
ED TO INPATIENT HANDOFF REPORT  ED Nurse Name and Phone #: Metta Clines [phone]  S Name/Age/Gender Patrick Jupiter Hosking 73 y.o. male Room/Bed: ED17A/ED17A  Code Status   Code Status: DNR  Home/SNF/Other PACE Patient oriented to: self, place, time and situation Is this baseline? Baseline per family is A&Ox3-4   Triage Complete: Triage complete  Chief Complaint foot swelling;infection  Triage Note Pt has home health nurse who wraps legs. bilat leg swelling and drainage. No recent antibiotics for legs. Legs are wrapped in triage. Redness noted above wraps. A&O, in wheelchair. HOH.    Allergies Allergies  Allergen Reactions  . Morphine And Related Other (See Comments)    Pt states that this medication put him in a coma.  . Percocet [Oxycodone-Acetaminophen] Other (See Comments)    Pt states that this medication put him in a coma.  . Bactrim [Sulfamethoxazole-Trimethoprim] Other (See Comments)    Reaction:  Unknown   . Benadryl [Diphenhydramine Hcl] Hives  . Keflex [Cephalexin] Hives  . Penicillins Hives and Other (See Comments)    Has patient had a PCN reaction causing immediate rash, facial/tongue/throat swelling, SOB or lightheadedness with hypotension: No Has patient had a PCN reaction causing severe rash involving mucus membranes or skin necrosis: No Has patient had a PCN reaction that required hospitalization No Has patient had a PCN reaction occurring within the last 10 years: No If all of the above answers are "NO", then may proceed with Cephalosporin use.    Level of Care/Admitting Diagnosis ED Disposition    ED Disposition Condition [facility] Area: [place] [[id number]]  Level of Care: Med-Surg [16]  Diagnosis: Cellulitis [454098]  Admitting Physician: Hillary Bow [[id number]]  Attending Physician: Hillary Bow [[id number]]  Estimated length of stay: past midnight tomorrow  Certification:: I certify this patient will need inpatient  services for at least 2 midnights  PT Class (Do Not Modify): Inpatient [101]  PT Acc Code (Do Not Modify): Private [1]       B Medical/Surgery History Past Medical History:  Diagnosis Date  . Arthritis   . Asthma   . Cancer ([place])   . CHF (congestive heart failure) ([place])   . Diabetes mellitus without complication ([place])   . Gout   . Hyperlipemia   . Hypertension   . Osteoarthritis   . Stroke [facility])    Past Surgical History:  Procedure Laterality Date  . CHOLECYSTECTOMY       A IV Location/Drains/Wounds Patient Lines/Drains/Airways Status   Active Line/Drains/Airways    Name:   Placement date:   Placement time:   Site:   Days:   Peripheral IV 11/02/18 Right Forearm   11/02/18    1234    Forearm   less than 1   External Urinary Catheter   09/13/18    0015    -   50   Pressure Injury 09/13/18 Stage I -  Intact skin with non-blanchable redness of a localized area usually over a bony prominence.   09/13/18    0015     50   Wound / Incision (Open or Dehisced) 09/14/18 Other (Comment) Foot Right   09/14/18    0800    Foot   49          Intake/Output Last 24 hours No intake or output data in the 24 hours ending 11/02/18 1540  Labs/Imaging Results for orders placed or performed during the hospital encounter of 11/02/18 (from the past 48 hour(s))  Comprehensive metabolic panel     Status: Abnormal   Collection Time: 11/02/18 12:35 PM  Result Value Ref Range   Sodium 138 135 - 145 mmol/L   Potassium 4.0 3.5 - 5.1 mmol/L   Chloride 101 98 - 111 mmol/L   CO2 27 22 - 32 mmol/L   Glucose, Bld 137 (H) 70 - 99 mg/dL   BUN 18 8 - 23 mg/dL   Creatinine, Ser 0.79 0.61 - 1.24 mg/dL   Calcium 8.8 (L) 8.9 - 10.3 mg/dL   Total Protein 7.0 6.5 - 8.1 g/dL   Albumin 3.3 (L) 3.5 - 5.0 g/dL   AST 19 15 - 41 U/L   ALT 13 0 - 44 U/L   Alkaline Phosphatase 56 38 - 126 U/L   Total Bilirubin 0.5 0.3 - 1.2 mg/dL   GFR calc non Af Amer >60 >60 mL/min   GFR calc Af Amer >60 >60 mL/min    Anion gap 10 5 - 15    Comment: Performed at [facility], [address] [place], [place] [address]  CBC with Differential     Status: Abnormal   Collection Time: 11/02/18 12:35 PM  Result Value Ref Range   WBC 10.8 (H) 4.0 - 10.5 K/uL   RBC 4.22 4.22 - 5.81 MIL/uL   Hemoglobin 12.1 (L) 13.0 - 17.0 g/dL   HCT 37.5 (L) 39.0 - 52.0 %   MCV 88.9 80.0 - 100.0 fL   MCH 28.7 26.0 - 34.0 pg   MCHC 32.3 30.0 - 36.0 g/dL   RDW 15.7 (H) 11.5 - 15.5 %   Platelets 261 150 - 400 K/uL   nRBC 0.0 0.0 - 0.2 %   Neutrophils Relative % 47 %   Neutro Abs 5.0 1.7 - 7.7 K/uL   Lymphocytes Relative 34 %   Lymphs Abs 3.6 0.7 - 4.0 K/uL   Monocytes Relative 13 %   Monocytes Absolute 1.4 (H) 0.1 - 1.0 K/uL   Eosinophils Relative 6 %   Eosinophils Absolute 0.7 (H) 0.0 - 0.5 K/uL   Basophils Relative 0 %   Basophils Absolute 0.0 0.0 - 0.1 K/uL   Immature Granulocytes 0 %   Abs Immature Granulocytes 0.04 0.00 - 0.07 K/uL    Comment: Performed at [facility], [address] [place], [place] [address]  Lactic acid, plasma     Status: Abnormal   Collection Time: 11/02/18  1:22 PM  Result Value Ref Range   Lactic Acid, Venous 2.8 (HH) 0.5 - 1.9 mmol/L    Comment: CRITICAL RESULT CALLED TO, READ BACK BY AND VERIFIED WITH GEORGIE Patsey Pitstick 11/02/2018 @ 1422 RDW/KLW Performed at [facility], [address], [place], [place] [address]    Dg Foot 2 Views Right  Result Date: 11/02/2018 CLINICAL DATA:  Bilateral leg swelling and drainage. EXAM: RIGHT FOOT - 2 VIEW COMPARISON:  None. FINDINGS: There is no evidence of fracture or dislocation. Degenerative joint changes with narrowed joint space and osteophyte formation are identified in the first digit. There is no bony destruction to suggest osteomyelitis. IMPRESSION: No plain film evidence of osteomyelitis. Degenerative joint changes of the right foot. Electronically Signed   By: Abelardo Diesel M.D.   On: 11/02/2018 13:46    Pending  Labs Unresulted Labs (From admission, onward)    Start     Ordered   11/09/18 0500  Creatinine, serum  (enoxaparin (LOVENOX)    CrCl >/= 30 ml/min)  Weekly,   STAT  Comments:  while on enoxaparin therapy    11/02/18 1441   11/03/18 8299  Basic metabolic panel  Tomorrow morning,   STAT     11/02/18 1441   11/03/18 0500  CBC  Tomorrow morning,   STAT     11/02/18 1441   11/02/18 1440  Hemoglobin A1c  Add-on,   AD    Comments:  To assess prior glycemic control    11/02/18 1441   11/02/18 1322  Lactic acid, plasma  Now then every 2 hours,   STAT     11/02/18 1321   11/02/18 1322  Blood culture (single)  ONCE - STAT,   STAT     11/02/18 1321          Vitals/Pain Today's Vitals   11/02/18 1515 11/02/18 1530 11/02/18 1531 11/02/18 1532  BP:  (!) 120/59    Pulse: 70 72    Resp: 20 (!) 27 12 18   Temp:      TempSrc:      SpO2: 99% 100%    Weight:      Height:      PainSc:        Isolation Precautions No active isolations  Medications Medications  insulin aspart (novoLOG) injection 0-9 Units (has no administration in time range)  insulin aspart (novoLOG) injection 0-5 Units (has no administration in time range)  enoxaparin (LOVENOX) injection 40 mg (has no administration in time range)  acetaminophen (TYLENOL) tablet 650 mg (has no administration in time range)    Or  acetaminophen (TYLENOL) suppository 650 mg (has no administration in time range)  polyethylene glycol (MIRALAX / GLYCOLAX) packet 17 g (has no administration in time range)  ondansetron (ZOFRAN) tablet 4 mg (has no administration in time range)    Or  ondansetron (ZOFRAN) injection 4 mg (has no administration in time range)  albuterol (PROVENTIL) (2.5 MG/3ML) 0.083% nebulizer solution 2.5 mg (has no administration in time range)  vancomycin (VANCOCIN) 2,500 mg in sodium chloride 0.9 % 500 mL IVPB (2,500 mg Intravenous New Bag/Given 11/02/18 1530)  neomycin-bacitracin-polymyxin (NEOSPORIN) 400-12-4998 ointment  packet (3 application  Given 3/71/69 1310)    Mobility non-ambulatory Moderate fall risk   Focused Assessments Leg swelling bilat; red; hot; malodorous   R Recommendations: See Admitting Provider Note  Report given to:   Additional Notes:  Lactic 2.8; R fa 22g IV; vanc running now.

## 2018-11-02 NOTE — ED Provider Notes (Signed)
[facility] Emergency Department Provider Note ____________________________________________   First MD Initiated Contact with Patient 11/02/18 1255     (approximate)  I have reviewed the triage vital signs and the nursing notes.   HISTORY  Chief Complaint Leg Swelling    HPI Mario Blackburn is a 73 y.o. male with PMH as noted below who presents with bilateral lower extremity swelling with acutely worsened redness and pain to the right lower extremity over the last week, gradual onset, associated with malodorous yellow drainage, and associated with some subjective chills.  The patient states that he has had an infection there before.  She denies any injury or trauma.  Past Medical History:  Diagnosis Date  . Arthritis   . Asthma   . Cancer ([place])   . CHF (congestive heart failure) ([place])   . Diabetes mellitus without complication ([place])   . Gout   . Hyperlipemia   . Hypertension   . Osteoarthritis   . Stroke [facility])     Patient Active Problem List   Diagnosis Date Noted  . Chronic venous insufficiency 10/20/2018  . Lymphedema 10/20/2018  . Bilateral lower extremity edema 09/29/2018  . Pressure injury of skin 09/15/2018  . Diabetes ([place]) 09/12/2018  . HTN (hypertension) 09/12/2018  . HLD (hyperlipidemia) 09/12/2018  . Chronic diastolic CHF (congestive heart failure) ([place]) 09/12/2018  . COPD (chronic obstructive pulmonary disease) ([place]) 09/12/2018  . Hypoxia 08/20/2015  . Chest pain 04/27/2015    Past Surgical History:  Procedure Laterality Date  . CHOLECYSTECTOMY      Prior to Admission medications   Medication Sig Start Date End Date Taking? Authorizing Provider  acetaminophen (TYLENOL) 650 MG CR tablet Take 650 mg by mouth 2 (two) times daily.     [provider]  albuterol (PROVENTIL) (2.5 MG/3ML) 0.083% nebulizer solution Take 2.5 mg by nebulization 4 (four) times daily as needed for wheezing or shortness of breath.    [provider]  amLODipine (NORVASC) 10 MG tablet Take 1 tablet (10 mg total) by mouth at bedtime. 09/16/18   Epifanio Lesches, MD  ARIPiprazole (ABILIFY) 5 MG tablet Take 5 mg by mouth daily.     [provider]  aspirin EC 81 MG tablet Take 81 mg by mouth daily.    [provider]  atorvastatin (LIPITOR) 80 MG tablet Take 80 mg by mouth at bedtime.    [provider]  bimatoprost (LUMIGAN) 0.01 % SOLN Place 1 drop into both eyes at bedtime.    [provider]  brimonidine-timolol (COMBIGAN) 0.2-0.5 % ophthalmic solution Place 1 drop into both eyes 2 (two) times daily.    [provider]  diclofenac sodium (VOLTAREN) 1 % GEL Apply 2-4 g topically 2 (two) times daily. Apply 2g to right shoulder and 4g to right knee    [provider]  divalproex (DEPAKOTE ER) 500 MG 24 hr tablet Take 1,000 mg by mouth 2 (two) times daily.    [provider]  fluticasone (FLONASE) 50 MCG/ACT nasal spray Place 1 spray into both nostrils daily.    [provider]  Fluticasone-Salmeterol (ADVAIR) 250-50 MCG/DOSE AEPB Inhale 1 puff into the lungs 2 (two) times daily.    [provider]  furosemide (LASIX) 40 MG tablet Take 40 mg by mouth daily.     [provider]  gabapentin (NEURONTIN) 300 MG capsule Take 600 mg by mouth at bedtime.     [provider]  insulin aspart (NOVOLOG)  100 UNIT/ML injection Inject 12-14 Units into the skin 2 (two) times daily with a meal. 12 units with breakfast and 14 units with dinner    [provider]  insulin glargine (LANTUS) 100 UNIT/ML injection Inject 10 Units into the skin at bedtime.     [provider]  ipratropium (ATROVENT) 0.03 % nasal spray Place 2 sprays into the nose 3 (three) times daily. 12/06/16 12/06/17  Darel Hong, MD  levothyroxine (SYNTHROID, LEVOTHROID) 50 MCG tablet Take 50 mcg by mouth daily before breakfast.    [provider]   lisinopril (PRINIVIL,ZESTRIL) 40 MG tablet Take 40 mg by mouth 2 (two) times daily.     [provider]  magnesium oxide (MAG-OX) 400 MG tablet Take 400 mg by mouth daily.    [provider]  metoprolol succinate (TOPROL-XL) 25 MG 24 hr tablet Take 25 mg by mouth daily.    [provider]  mupirocin ointment (BACTROBAN) 2 % Place 1 application into the nose 2 (two) times daily. 09/16/18   Epifanio Lesches, MD  nitroGLYCERIN (NITROSTAT) 0.4 MG SL tablet Place 0.4 mg under the tongue every 5 (five) minutes as needed for chest pain.    [provider]  pantoprazole (PROTONIX) 40 MG tablet Take 1 tablet (40 mg total) by mouth daily. 09/17/18   Epifanio Lesches, MD  ranitidine (ZANTAC) 150 MG tablet Take 150 mg by mouth 2 (two) times daily.    [provider]  spironolactone (ALDACTONE) 25 MG tablet Take 25 mg by mouth daily.    [provider]  tiotropium (SPIRIVA) 18 MCG inhalation capsule Place 1 capsule (18 mcg total) into inhaler and inhale daily. 09/17/18   Epifanio Lesches, MD  venlafaxine XR (EFFEXOR-XR) 150 MG 24 hr capsule Take 150 mg by mouth daily with breakfast. Take with 75mg  capsule for 225mg     [provider]  venlafaxine XR (EFFEXOR-XR) 75 MG 24 hr capsule Take 75 mg by mouth daily. Take with 150mg  capsule for 225mg     [provider]  Vitamins A & D (VITAMIN A & D) ointment Apply 1 application topically 3 (three) times daily.    [provider]  Zinc Oxide (DESITIN) 13 % CREA Apply 1 application topically 2 (two) times daily. Apply to scrotum    [provider]    Allergies Morphine and related; Percocet [oxycodone-acetaminophen]; Bactrim [sulfamethoxazole-trimethoprim]; Benadryl [diphenhydramine hcl]; Keflex [cephalexin]; and Penicillins  Family History  Family history unknown: Yes    Social History Social History   Tobacco Use  . Smoking status: Former Research scientist (life sciences)  . Smokeless  tobacco: Never Used  Substance Use Topics  . Alcohol use: No  . Drug use: No    Review of Systems  Constitutional: Positive for chills. Eyes: No redness. ENT: No sore throat. Cardiovascular: Denies chest pain. Respiratory: Denies shortness of breath. Gastrointestinal: No vomiting or diarrhea.  Genitourinary: Negative for dysuria.  Musculoskeletal: Negative for back pain.  Positive for edema. Skin: Positive for rash. Neurological: Negative for focal weakness or numbness.   ____________________________________________   PHYSICAL EXAM:  VITAL SIGNS: ED Triage Vitals  Enc Vitals Group     BP 11/02/18 1227 (!) 147/57     Pulse Rate 11/02/18 1227 73     Resp 11/02/18 1227 18     Temp 11/02/18 1227 98 F (36.7 C)     Temp Source 11/02/18 1227 Oral     SpO2 11/02/18 1227 100 %     Weight 11/02/18 1228 300  lb (136.1 kg)     Height 11/02/18 1228 5\' 9"  (1.753 m)     Head Circumference --      Peak Flow --      Pain Score 11/02/18 1228 7     Pain Loc --      Pain Edu? --      Excl. in [place]? --     Constitutional: Alert and oriented.  Chronically ill-appearing but in no acute distress. Eyes: Conjunctivae are normal.  Head: Atraumatic. Nose: No congestion/rhinnorhea. Mouth/Throat: Mucous membranes are moist.   Neck: Normal range of motion.  Cardiovascular: Normal rate, regular rhythm. Good peripheral circulation. Respiratory: Normal respiratory effort.  Gastrointestinal:  No distention.  Musculoskeletal: 3+ bilateral lower extremity edema.  Extremities warm and well perfused.  Right ankle and foot with erythema, induration, and warmth with purulent yellow discharge.  No large or deep ulceration.  Left distal foot with similar erythema and induration. Neurologic:  Normal speech and language. No gross focal neurologic deficits are appreciated.  Skin:  Skin is warm and dry.  Bilateral lower extremity rash as above. Psychiatric: Mood and affect are normal. Speech and behavior are  normal.  ____________________________________________   LABS (all labs ordered are listed, but only abnormal results are displayed)  Labs Reviewed  COMPREHENSIVE METABOLIC PANEL - Abnormal; Notable for the following components:      Result Value   Glucose, Bld 137 (*)    Calcium 8.8 (*)    Albumin 3.3 (*)    All other components within normal limits  CBC WITH DIFFERENTIAL/PLATELET - Abnormal; Notable for the following components:   WBC 10.8 (*)    Hemoglobin 12.1 (*)    HCT 37.5 (*)    RDW 15.7 (*)    Monocytes Absolute 1.4 (*)    Eosinophils Absolute 0.7 (*)    All other components within normal limits  LACTIC ACID, PLASMA - Abnormal; Notable for the following components:   Lactic Acid, Venous 2.8 (*)    All other components within normal limits  CULTURE, BLOOD (SINGLE)  LACTIC ACID, PLASMA   ____________________________________________  EKG   ____________________________________________  RADIOLOGY  XR R foot: No acute bony abnormality  ____________________________________________   PROCEDURES  Procedure(s) performed: No  Procedures  Critical Care performed: No ____________________________________________   INITIAL IMPRESSION / ASSESSMENT AND PLAN / ED COURSE  Pertinent labs & imaging results that were available during my care of the patient were reviewed by me and considered in my medical decision making (see chart for details).  73 year old male with PMH as noted above presents with persistent lower extremity edema over the last week with worsening pain, redness, warmth, and purulent drainage particular to the right ankle and foot over the last several days.  He also has subjective chills.  I reviewed the past medical records in Epic; the patient was most recently admitted in January with right lower extremity cellulitis.  On exam today, the patient is relatively comfortable appearing.  He has significant bilateral lower extremity edema and cutaneous  findings as above.  There is significant purulent drainage to the right foot and a few superficial blisters but no significant ulceration.  Presentation is consistent with bilateral lower extremity cellulitis.  We will obtain x-ray to rule out any acute bony abnormality, lab work-up, give IV antibiotics, and reassess.  ----------------------------------------- 2:29 PM on 11/02/2018 -----------------------------------------  X-ray is reassuring.  The lab work-up is relatively unremarkable.  However given the extent and severity of the cellulitis and the  patient's comorbidities, I do not think he is a good candidate for outpatient treatment with an oral antibiotic at this time.  I signed him out to the hospitalist Dr. Darvin Neighbours for admission. ____________________________________________   FINAL CLINICAL IMPRESSION(S) / ED DIAGNOSES  Final diagnoses:  Cellulitis of right lower extremity      NEW MEDICATIONS STARTED DURING THIS VISIT:  New Prescriptions   No medications on file     Note:  This document was prepared using Dragon voice recognition software and may include unintentional dictation errors.    Arta Silence, MD 11/02/18 1429

## 2018-11-03 DIAGNOSIS — E119 Type 2 diabetes mellitus without complications: Secondary | ICD-10-CM

## 2018-11-03 DIAGNOSIS — L03115 Cellulitis of right lower limb: Principal | ICD-10-CM

## 2018-11-03 DIAGNOSIS — R609 Edema, unspecified: Secondary | ICD-10-CM

## 2018-11-03 DIAGNOSIS — Z7982 Long term (current) use of aspirin: Secondary | ICD-10-CM

## 2018-11-03 DIAGNOSIS — I82511 Chronic embolism and thrombosis of right femoral vein: Secondary | ICD-10-CM

## 2018-11-03 LAB — GLUCOSE, CAPILLARY
GLUCOSE-CAPILLARY: 114 mg/dL — AB (ref 70–99)
GLUCOSE-CAPILLARY: 103 mg/dL — AB (ref 70–99)
Glucose-Capillary: 176 mg/dL — ABNORMAL HIGH (ref 70–99)
Glucose-Capillary: 162 mg/dL — ABNORMAL HIGH (ref 70–99)

## 2018-11-03 LAB — BASIC METABOLIC PANEL
Anion gap: 8 (ref 5–15)
BUN: 17 mg/dL (ref 8–23)
CO2: 27 mmol/L (ref 22–32)
Calcium: 8.2 mg/dL — ABNORMAL LOW (ref 8.9–10.3)
Chloride: 105 mmol/L (ref 98–111)
Creatinine, Ser: 0.94 mg/dL (ref 0.61–1.24)
GFR calc Af Amer: >60 mL/min (ref >60)
GFR calc non Af Amer: >60 mL/min (ref >60)
GLUCOSE: 189 mg/dL — AB (ref 70–99)
Potassium: 3.7 mmol/L (ref 3.5–5.1)
Sodium: 140 mmol/L (ref 135–145)

## 2018-11-03 LAB — CBC
HCT: 32.7 % — ABNORMAL LOW (ref 39.0–52.0)
Hemoglobin: 10.5 g/dL — ABNORMAL LOW (ref 13.0–17.0)
MCH: 28.5 pg (ref 26.0–34.0)
MCHC: 32.1 g/dL (ref 30.0–36.0)
MCV: 88.6 fL (ref 80.0–100.0)
Platelets: 248 10*3/uL (ref 150–400)
RBC: 3.69 MIL/uL — ABNORMAL LOW (ref 4.22–5.81)
RDW: 16 % — ABNORMAL HIGH (ref 11.5–15.5)
WBC: 7.7 10*3/uL (ref 4.0–10.5)
nRBC: 0 % (ref 0.0–0.2)

## 2018-11-03 MED ORDER — VENLAFAXINE HCL ER 75 MG PO CP24
225.0000 mg | ORAL_CAPSULE | Freq: Every day | ORAL | Status: DC
  Administered 2018-11-04: 225 mg via ORAL
  Filled 2018-11-03: qty 3

## 2018-11-03 MED ORDER — ENOXAPARIN SODIUM 40 MG/0.4ML ~~LOC~~ SOLN
40.0000 mg | Freq: Two times a day (BID) | SUBCUTANEOUS | Status: DC
  Administered 2018-11-03 – 2018-11-04 (×3): 40 mg via SUBCUTANEOUS
  Filled 2018-11-03 (×3): qty 0.4

## 2018-11-03 NOTE — TOC Progression Note (Signed)
Transition of Care [facility]) - Progression Note    Patient Details  Name: Mario Blackburn MRN: [id number] Date of Birth: [birth year]  Transition of Care [facility]) CM/SW Contact  Beverly Sessions, RN Phone Number: 11/03/2018, 3:16 PM  Clinical Narrative:    Wife now at bedside.  Per wife husband lives at home with wife, daughter, and son in [employer].  PCP [facility].  Wife states that she transportation patient to and from appointments.  Patient recently was discharged from [facility] and [facility] services.  Wife states "I yelled and argued with the PACE people and they stopped services".  Wife states "they were wanting to take all of his check, and that doesn't leave me with enough to pay the bills".    Per wife they currently have a WC, lift, and BSC in the home. PACE is to pick back up the [facility] and lift.  The [facility] is property of the patient.  Per wife, patient will need WC, lift, and hospital bed at discharge.   PT has assessed patient and recommends SNF.  Patient is adamant that he will not be returning to "any facility"   Expected Discharge Plan: [place] Barriers to Discharge: Continued Medical Work up  Expected Discharge Plan and Services Expected Discharge Plan: [place] Discharge Planning Services: CM Consult   Living arrangements for the past 2 months: Single Family Home Expected Discharge Date: 11/05/18                   HH Arranged: RN [place] Agency: [place]   Social Determinants of Health (SDOH) Interventions    Readmission Risk Interventions 30 Day Unplanned Readmission Risk Score     ED to Hosp-Admission (Current) from 11/02/2018 in [place]  30 Day Unplanned Readmission Risk Score (%)  23 Filed at 11/03/2018 1200     This score is the patient's risk of an unplanned readmission within 30 days of being discharged (0 -100%). The score is based on dignosis, age, lab data, medications,  orders, and past utilization.   Low:  0-14.9   Medium: 15-21.9   High: 22-29.9   Extreme: 30 and above       No flowsheet data found.

## 2018-11-03 NOTE — Consult Note (Signed)
[facility] VE[facility] Vascular Consult Note  MRN : [id number]  Mario Blackburn is a 73 y.o. ([birth year]) male who presents with chief complaint of  Chief Complaint  Patient presents with  . Leg Swelling   History of Present Illness:  The patient is a 73 year old male with a past medical history of CVA, osteoarthritis, hypertension, hyperlipidemia, gout, diabetes, congestive heart failure, cancer, asthma, arthritis, chronic venous insufficiency, lymphedema, who presented to the [facility] with progressively worsening lower extremity edema and erythema.  The patient is well-known to our service.  The patient has been seen as an inpatient and in the outpatient setting.  The patient has recently been diagnosed with chronic venous insufficiency and lymphedema.  Patient has been treated with three layer zinc oxide unna wraps however has been noncompliant with elevation.  As per wife, the patient sits in a dependent position throughout the day and refuses to elevate his legs.  Due to the patient's noncompliance, he has experienced worsening edema and cellulitis.  On October 20, 2018, the patient was seen in our clinic. Conservative therapy including the need for three layer zinc oxide unna wraps changed at least once a week and elevation heart level or higher were thoroughly discussed.  At that time, the patient was noted to have worsening edema and cellulitis of the toes due to not elevating and remaining in a dependent position.  He was treated with doxycycline at the time.  Plan was for the patient to return in approximately one month to fill insurance requirements to apply for lymphedema pump.  ABI performed on September 29, 2018: ATA                             biphasic                             +---------+------------------+-----+---------+---------------------------+ PTA                             triphasic                             +---------+------------------+-----+---------+---------------------------+ [facility]               1.72 Normal                               +---------+------------------+-----+---------+---------------------------+  +---------+------------------+-----+--------+-------+ Left     Lt Pressure (mmHg)IndexWaveformComment +---------+------------------+-----+--------+-------+ Brachial 125                                    +---------+------------------+-----+--------+-------+ ATA                             biphasic        +---------+------------------+-----+--------+-------+ PTA                             biphasic        +---------+------------------+-----+--------+-------+ Great Toe171               1.37 Normal    Vascular surgery was consulted by Dr. Benjie Karvonen for further  recommendations Current Facility-Administered Medications  Medication Dose Route Frequency Provider Last Rate Last Dose  . acetaminophen (TYLENOL) tablet 650 mg  650 mg Oral Q6H PRN Hillary Bow, MD   650 mg at 11/02/18 2015   Or  . acetaminophen (TYLENOL) suppository 650 mg  650 mg Rectal Q6H PRN Sudini, Alveta Heimlich, MD      . albuterol (PROVENTIL) (2.5 MG/3ML) 0.083% nebulizer solution 2.5 mg  2.5 mg Nebulization Q2H PRN Sudini, Srikar, MD      . ARIPiprazole (ABILIFY) tablet 5 mg  5 mg Oral Daily Hillary Bow, MD   5 mg at 11/03/18 0859  . aspirin EC tablet 81 mg  81 mg Oral Daily Hillary Bow, MD   81 mg at 11/03/18 0903  . atorvastatin (LIPITOR) tablet 80 mg  80 mg Oral QHS Hillary Bow, MD   80 mg at 11/02/18 2242  . brimonidine (ALPHAGAN) 0.2 % ophthalmic solution 1 drop  1 drop Both Eyes BID Paticia Stack, [place]   1 drop at 11/03/18 0857   And  . timolol (TIMOPTIC) 0.5 % ophthalmic solution 1 drop  1 drop Both Eyes BID Paticia Stack, [place]   1 drop at 11/03/18 0857  . Chlorhexidine Gluconate Cloth 2 % PADS 6 each  6 each Topical Q0600 Hillary Bow, MD   6 each at  11/03/18 0654  . divalproex (DEPAKOTE ER) 24 hr tablet 1,000 mg  1,000 mg Oral BID Hillary Bow, MD   1,000 mg at 11/03/18 0859  . enoxaparin (LOVENOX) injection 40 mg  40 mg Subcutaneous Q12H Lu Duffel, RPH   40 mg at 11/03/18 1156  . fluticasone (FLONASE) 50 MCG/ACT nasal spray 1 spray  1 spray Each Nare Daily Hillary Bow, MD   1 spray at 11/03/18 0904  . furosemide (LASIX) tablet 40 mg  40 mg Oral Daily Hillary Bow, MD   40 mg at 11/03/18 0903  . gabapentin (NEURONTIN) capsule 600 mg  600 mg Oral QHS Hillary Bow, MD   600 mg at 11/02/18 2242  . insulin aspart (novoLOG) injection 0-5 Units  0-5 Units Subcutaneous QHS Sudini, Srikar, MD      . insulin aspart (novoLOG) injection 0-9 Units  0-9 Units Subcutaneous TID WC Hillary Bow, MD   2 Units at 11/03/18 1156  . insulin glargine (LANTUS) injection 10 Units  10 Units Subcutaneous QHS Hillary Bow, MD   10 Units at 11/02/18 2246  . latanoprost (XALATAN) 0.005 % ophthalmic solution 1 drop  1 drop Both Eyes QHS Hillary Bow, MD   1 drop at 11/02/18 2251  . levothyroxine (SYNTHROID, LEVOTHROID) tablet 50 mcg  50 mcg Oral QAC breakfast Hillary Bow, MD   50 mcg at 11/03/18 0654  . lisinopril (PRINIVIL,ZESTRIL) tablet 40 mg  40 mg Oral BID Hillary Bow, MD   40 mg at 11/03/18 0903  . magnesium oxide (MAG-OX) tablet 600 mg  600 mg Oral Daily Hillary Bow, MD   600 mg at 11/03/18 0903  . metoprolol succinate (TOPROL-XL) 24 hr tablet 25 mg  25 mg Oral Daily Hillary Bow, MD   25 mg at 11/03/18 0903  . mometasone-formoterol (DULERA) 200-5 MCG/ACT inhaler 2 puff  2 puff Inhalation BID Hillary Bow, MD   2 puff at 11/03/18 0856  . mupirocin ointment (BACTROBAN) 2 % 1 application  1 application Nasal BID Hillary Bow, MD   1 application at 47/82/95 [phone]  . nitroGLYCERIN (NITROSTAT) SL tablet 0.4 mg  0.4 mg Sublingual Q5 min  PRN Hillary Bow, MD      . ondansetron [facility]) tablet 4 mg  4 mg Oral Q6H PRN Hillary Bow,  MD       Or  . ondansetron (ZOFRAN) injection 4 mg  4 mg Intravenous Q6H PRN Sudini, Srikar, MD      . pantoprazole (PROTONIX) EC tablet 40 mg  40 mg Oral Daily Hillary Bow, MD   40 mg at 11/03/18 0904  . polyethylene glycol (MIRALAX / GLYCOLAX) packet 17 g  17 g Oral Daily PRN Sudini, Alveta Heimlich, MD      . spironolactone (ALDACTONE) tablet 25 mg  25 mg Oral Daily Hillary Bow, MD   25 mg at 11/03/18 0903  . tiotropium (SPIRIVA) inhalation capsule ([facility] use ONLY) 18 mcg  18 mcg Inhalation Daily Hillary Bow, MD   18 mcg at 11/03/18 [phone]  . traMADol (ULTRAM) tablet 50 mg  50 mg Oral Q6H PRN Hillary Bow, MD   50 mg at 11/03/18 1415  . vancomycin (VANCOCIN) 1,250 mg in sodium chloride 0.9 % 250 mL IVPB  1,250 mg Intravenous Q12H Nazari, Walid A, RPH 166.7 mL/hr at 11/03/18 0400    . [START ON 11/04/2018] venlafaxine XR (EFFEXOR-XR) 24 hr capsule 225 mg  225 mg Oral Q breakfast Bettey Costa, MD       Past Medical History:  Diagnosis Date  . Arthritis   . Asthma   . Cancer ([place])   . CHF (congestive heart failure) ([place])   . Diabetes mellitus without complication ([place])   . Gout   . Hyperlipemia   . Hypertension   . Osteoarthritis   . Stroke [facility])    Past Surgical History:  Procedure Laterality Date  . CHOLECYSTECTOMY     Social History Social History   Tobacco Use  . Smoking status: Former Research scientist (life sciences)  . Smokeless tobacco: Never Used  Substance Use Topics  . Alcohol use: No  . Drug use: No   Family History Family History  Family history unknown: Yes  Denies family history of peripheral artery disease, venous disease and/or bleeding/clotting disorders.  Allergies  Allergen Reactions  . Morphine And Related Other (See Comments)    Pt states that this medication put him in a coma.  . Percocet [Oxycodone-Acetaminophen] Other (See Comments)    Pt states that this medication put him in a coma.  . Bactrim [Sulfamethoxazole-Trimethoprim] Other (See Comments)    Reaction:  Unknown    . Benadryl [Diphenhydramine Hcl] Hives  . Keflex [Cephalexin] Hives  . Penicillins Hives and Other (See Comments)    Has patient had a PCN reaction causing immediate rash, facial/tongue/throat swelling, SOB or lightheadedness with hypotension: No Has patient had a PCN reaction causing severe rash involving mucus membranes or skin necrosis: No Has patient had a PCN reaction that required hospitalization No Has patient had a PCN reaction occurring within the last 10 years: No If all of the above answers are "NO", then may proceed with Cephalosporin use.   REVIEW OF SYSTEMS (Negative unless checked)  Constitutional: [] Weight loss  [] Fever  [] Chills Cardiac: [] Chest pain   [] Chest pressure   [] Palpitations   [] Shortness of breath when laying flat   [] Shortness of breath at rest   [] Shortness of breath with exertion. Vascular:  [x] Pain in legs with walking   [x] Pain in legs at rest   [x] Pain in legs when laying flat   [] Claudication   [] Pain in feet when walking  [] Pain in feet at rest  [] Pain in feet  when laying flat   [x] History of DVT   [] Phlebitis   [x] Swelling in legs   [] Varicose veins   [] Non-healing ulcers Pulmonary:   [] Uses home oxygen   [] Productive cough   [] Hemoptysis   [] Wheeze  [] COPD   [] Asthma Neurologic:  [] Dizziness  [] Blackouts   [] Seizures   [x] History of stroke   [] History of TIA  [] Aphasia   [] Temporary blindness   [] Dysphagia   [] Weakness or numbness in arms   [] Weakness or numbness in legs Musculoskeletal:  [] Arthritis   [] Joint swelling   [] Joint pain   [] Low back pain Hematologic:  [] Easy bruising  [] Easy bleeding   [] Hypercoagulable state   [] Anemic  [] Hepatitis Gastrointestinal:  [] Blood in stool   [] Vomiting blood  [] Gastroesophageal reflux/heartburn   [] Difficulty swallowing. Genitourinary:  [] Chronic kidney disease   [] Difficult urination  [] Frequent urination  [] Burning with urination   [] Blood in urine Skin:  [] Rashes   [] Ulcers   [] Wounds Psychological:  [] History  of anxiety   []  History of major depression.  Physical Examination  Vitals:   11/02/18 2247 11/03/18 0500 11/03/18 0512 11/03/18 1224  BP: 132/70  (!) 128/57 (!) 98/53  Pulse:   77 69  Resp:   (!) 9 20  Temp:   99 F (37.2 C) 98.3 F (36.8 C)  TempSrc:   Oral Oral  SpO2:   99% 95%  Weight:  (!) 138 kg    Height:       Body mass index is 44.92 kg/m. Gen:  WD/WN, NAD, Morbidly Obese Head: [place]/AT, No temporalis wasting. Prominent temp pulse not noted. Ear/Nose/Throat: Hearing grossly intact, nares w/o erythema or drainage, oropharynx w/o Erythema/Exudate Eyes: Sclera non-icteric, conjunctiva clear Neck: Trachea midline.  No JVD.  Pulmonary:  Good air movement, respirations not labored, equal bilaterally.  Cardiac: RRR, normal S1, S2. Vascular:  Vessel Right Left  Radial Palpable Palpable  Ulnar Palpable Palpable  Brachial Palpable Palpable  Carotid Palpable, without bruit Palpable, without bruit  Aorta Not palpable N/A  Femoral Palpable Palpable  Popliteal Non-Palpable Non-Palpable  PT Non-Palpable Non-Palpable  DP Non-Palpable Non-Palpable   Left Lower Extremity: Venous stasis dermatitis changes noted.  Mild to moderate nonpitting edema noted.  Skin fibrosis noted.  No cellulitis noted.  Skin is intact.  Right Lower Extremity: Unna wrap in place and dry.  Erythema to the toes.  Gastrointestinal: soft, non-tender/non-distended. No guarding/reflex.  Musculoskeletal: M/S 5/5 throughout. No deformity or atrophy.  Neurologic: Sensation grossly intact in extremities.  Symmetrical.  Speech is fluent. Motor exam as listed above. Psychiatric: Judgment intact, Mood & affect appropriate for pt's clinical situation. Dermatologic: No rashes or ulcers noted.   Lymph : No Cervical, Axillary, or Inguinal lymphadenopathy.  CBC Lab Results  Component Value Date   WBC 7.7 11/02/2018   HGB 10.5 (L) 11/02/2018   HCT 32.7 (L) 11/02/2018   MCV 88.6 11/02/2018   PLT 248 11/02/2018    BMET    Component Value Date/Time   NA 140 11/02/2018 2231   NA 136 09/09/2013 2101   K 3.7 11/02/2018 2231   K 4.8 09/09/2013 2101   CL 105 11/02/2018 2231   CL 101 09/09/2013 2101   CO2 27 11/02/2018 2231   CO2 32 09/09/2013 2101   GLUCOSE 189 (H) 11/02/2018 2231   GLUCOSE 115 (H) 09/09/2013 2101   BUN 17 11/02/2018 2231   BUN 13 09/09/2013 2101   CREATININE 0.94 11/02/2018 2231   CREATININE 0.95 09/09/2013 2101   CALCIUM 8.2 (  L) 11/02/2018 2231   CALCIUM 8.5 09/09/2013 2101   GFRNONAA >60 11/02/2018 2231   GFRNONAA >60 09/09/2013 2101   GFRAA >60 11/02/2018 2231   GFRAA >60 09/09/2013 2101   Estimated Creatinine Clearance: 99.5 mL/min (by C-G formula based on SCr of 0.94 mg/dL).  COAG No results found for: INR, PROTIME  Radiology Dg Foot 2 Views Right  Result Date: 11/02/2018 CLINICAL DATA:  Bilateral leg swelling and drainage. EXAM: RIGHT FOOT - 2 VIEW COMPARISON:  None. FINDINGS: There is no evidence of fracture or dislocation. Degenerative joint changes with narrowed joint space and osteophyte formation are identified in the first digit. There is no bony destruction to suggest osteomyelitis. IMPRESSION: No plain film evidence of osteomyelitis. Degenerative joint changes of the right foot. Electronically Signed   By: Abelardo Diesel M.D.   On: 11/02/2018 13:46   Vas [place] Lower Extremity Venous Reflux  Result Date: 10/21/2018  Lower Venous Reflux Study Indications: Pain, Swelling, Edema, and ulceration.  Limitations: Body habitus, poor ultrasound/tissue interface and Patient states he is unable to do valsalva and did not want manual valsalva. Comparison Study: 01/09/2014 Performing Technologist: Charlane Ferretti RT (R)(VS)  Examination Guidelines: A complete evaluation includes B-mode imaging, spectral Doppler, color Doppler, and power Doppler as needed of all accessible portions of each vessel. Bilateral testing is considered an integral part of a complete examination. Limited  examinations for reoccurring indications may be performed as noted. The reflux portion of the exam is performed with the patient in reverse Trendelenburg.  Right Venous Findings: +---------+---------------+---------+-----------+----------+-----------+          CompressibilityPhasicitySpontaneityPropertiesSummary     +---------+---------------+---------+-----------+----------+-----------+ CFV      None                                                     +---------+---------------+---------+-----------+----------+-----------+ FV Prox  None                                                     +---------+---------------+---------+-----------+----------+-----------+ FV Mid   None                                                     +---------+---------------+---------+-----------+----------+-----------+ FV DistalNone                                         collaterals +---------+---------------+---------+-----------+----------+-----------+ POP      Full                                                     +---------+---------------+---------+-----------+----------+-----------+ SSV      Partial                                                  +---------+---------------+---------+-----------+----------+-----------+  Right non occlusive thrombus within the SSV. Left Venous Findings: +---------+---------------+---------+-----------+----------+--------------+          CompressibilityPhasicitySpontaneityPropertiesSummary        +---------+---------------+---------+-----------+----------+--------------+ CFV      Full                                                        +---------+---------------+---------+-----------+----------+--------------+ SFJ      Full                                                        +---------+---------------+---------+-----------+----------+--------------+ FV Prox  Full                                                         +---------+---------------+---------+-----------+----------+--------------+ FV Mid   Full                                                        +---------+---------------+---------+-----------+----------+--------------+ FV DistalFull                                                        +---------+---------------+---------+-----------+----------+--------------+ PFV      Full                                                        +---------+---------------+---------+-----------+----------+--------------+ POP      Full                                                        +---------+---------------+---------+-----------+----------+--------------+ GSV      Full                                                        +---------+---------------+---------+-----------+----------+--------------+ SSV                                                   Not visualized +---------+---------------+---------+-----------+----------+--------------+  Right Reflux Technical Findings: Reflux greater than 58ms in the popliteal vein.  Summary: Right: Findings consistent with chronic deep vein  thrombosis involving the right common femoral vein, and right femoral vein. 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 Unable to visualize right  GSV due to patient discomfort and immobility. Left: No evidence of deep vein thrombosis in the lower extremity. No indirect evidence of obstruction proximal to the inguinal ligament. There is no evidence of deep vein thrombosis in the lower extremity.There is no evidence of superficial venous thrombosis. Very limited exam due to patient unable to cooperate.  *See table(s) above for measurements and observations. Electronically signed by Hortencia Pilar MD on 10/21/2018 at 4:43:17 PM.    Final    Assessment/Plan The patient is a 73 year old male with a past medical history of CVA, osteoarthritis,  hypertension, hyperlipidemia, gout, diabetes, congestive heart failure, cancer, asthma, arthritis, chronic venous insufficiency, lymphedema, who presented to the [facility] with cellulitis. 1. Lower Extremity Edema / Cellulitis: During our conversation today, the patient continues to refuse to elevate his legs during the day.  He states he elevates his legs at night.  Again, I had a long conversation with the patient about the need for elevation especially during the day heart level or higher to allow the fluid to drain from his legs.  We discussed how the consistency of the fluid that remains stagnant in his legs will continue to lead recurrent and worsening bouts of cellulitis.  I also reminded him that he is at high risk for wound development and or tissue loss.  Recommend continued three layer zinc oxide unna wraps changed at least once weekly or more often as the patient has presented to the office with his unna wraps soaked in urine.  Elevation heart level or higher including during the day.  Recent ABI without significant peripheral artery disease.  There is no indication for any endovascular intervention at this time.  For to me to be able to apply for lymphedema pump I must have documentation that the patient has been engaging in conservative therapy which includes three layer zinc oxide unna wraps for at least one month.  We will see the patient in our office around November 20, 2018 to fill insurance requirements and then move forward with the lymphedema pump as an added therapy. 2. Chronic DVT / SVT: Recent venous duplex was notable for:  Right: Reflux noted in the right popliteal vein.  Findings consistent with chronic deep vein thrombosis involving the right common femoral vein and right femoral vein.  Findings consistent with chronic right superficial vein thrombosis in the right small saphenous vein. Continue ASA.  3. Diabetes: On appropriate medications. Encouraged good control  as its slows the progression of atherosclerotic disease.  Discussed with Dr. Mayme Genta, PA-C  11/03/2018 2:44 PM  This note was created with Dragon medical transcription system.  Any error is purely unintentional.

## 2018-11-03 NOTE — Progress Notes (Signed)
PHARMACIST - PHYSICIAN COMMUNICATION  CONCERNING:  Enoxaparin (Lovenox) for DVT Prophylaxis    RECOMMENDATION: Patient was prescribed enoxaprin 40mg  q24 hours for VTE prophylaxis.   Filed Weights   11/02/18 1228 11/03/18 0500  Weight: 300 lb (136.1 kg) (!) 304 lb 3.2 oz (138 kg)    Body mass index is 44.92 kg/m.  Estimated Creatinine Clearance: 99.5 mL/min (by C-G formula based on SCr of 0.94 mg/dL).   Based on [place] patient is candidate for enoxaparin 40mg  every 12 hour dosing due to BMI being >40.  DESCRIPTION: Pharmacy has adjusted enoxaparin dose per [facility] policy.  Patient is now receiving enoxaparin 40mg  every 12 hours.    Lu Duffel, PharmD, BCPS Clinical Pharmacist 11/03/2018 10:23 AM

## 2018-11-03 NOTE — Discharge Instructions (Signed)
Continue three layer zinc oxide unna wraps changed at least once a week or sooner if wraps are found to be wet. Patient must elevate his legs heart level or higher including during the day as much as possible.

## 2018-11-03 NOTE — Consult Note (Signed)
[place] Nurse wound consult note Reason for Consult: cellulitis  Wound type: venous stasis with history of non compliance with compression wrap. Patient self reports that the compression stockings needed for long term therapy "hurt my legs" He presents today with weeping mostly of the dorsal foot and minimally on the posterior calf of the RLE. The LLE is less edematous and not weeping. Both LE have skin changes consistent with venous dermatitis and venous stasis.  Pressure Injury POA: NA Patient self reports "sore on my bottom", but this is moisture associated skin damage from incontinence which I will describe below.  Measurement: NA Wound bed: NA Drainage (amount, consistency, odor) serous weeping with maceration of the dorsal foot and some maceration in the web spaces of the right foot. Noted serous weeping on the posterior calf when applying dressing.  Periwound: redness, however both LEs present with hemosiderin staining. The LLE is not red Dressing procedure/placement/frequency: Silver hydrofiber for weeping dorsal foot and web spaces.  Do not feel that this patient will allow therapeutic compression to be in place very long and with erythema would not recommend this type of compression at this time.   Today I have applied topical therapy for the dorsal foot and toes. I have wrapped in a dry boot (conform and coban) with light stretch to see if patient will tolerate.   Needs compression therapy long term after cellulitis has resolved. Patient is non ambulatory and would most benefit from 4 layer sustained compression wraps with transition to compression stockings.  There may be other options for what long term stockings the patient would need. Would recommend evaluation in the wound care center of the patient's choice for this.   Noted during my assessment patient self reports he sits in chair all the time or bed and urinates on himself. This would explain the moisture associated skin damage that  covers his dependent scrotum, posterior thighs and lower buttocks. He does not currently have a pressure injury. Patient is able to turn himself with minimal assistance in the bed.   Discussed POC with patient and bedside nurse.  Re consult if needed, will not follow at this time. Thanks  Nicki Gracy [employer], RN,CWOCN, CNS, [place] [phone])

## 2018-11-03 NOTE — Progress Notes (Signed)
[facility] [place] NAME: Kristoph Sattler    MR#:  [id number]  DATE OF BIRTH:  02-25-1946  SUBJECTIVE:   Patient presented to the emergency room due to concern of cellulitis/infection of posterior calf of right lower extremity.  REVIEW OF SYSTEMS:    Review of Systems  Constitutional: Negative for fever, chills weight loss HENT: Negative for ear pain, nosebleeds, congestion, facial swelling, rhinorrhea, neck pain, neck stiffness and ear discharge.   Respiratory: Negative for cough, shortness of breath, wheezing  Cardiovascular: Negative for chest pain, palpitations and ++leg swelling.  Gastrointestinal: Negative for heartburn, abdominal pain, vomiting, diarrhea or consitpation Genitourinary: Negative for dysuria, urgency, frequency, hematuria Musculoskeletal: Negative for back pain or joint pain Neurological: Negative for dizziness, seizures, syncope, focal weakness,  numbness and headaches.  Hematological: Does not bruise/bleed easily.  Psychiatric/Behavioral: Negative for hallucinations, no dysphoric mood positive confusion Skin: Weeping of lower extremity and edema  Tolerating Diet: yes      DRUG ALLERGIES:   Allergies  Allergen Reactions  . Morphine And Related Other (See Comments)    Pt states that this medication put him in a coma.  . Percocet [Oxycodone-Acetaminophen] Other (See Comments)    Pt states that this medication put him in a coma.  . Bactrim [Sulfamethoxazole-Trimethoprim] Other (See Comments)    Reaction:  Unknown   . Benadryl [Diphenhydramine Hcl] Hives  . Keflex [Cephalexin] Hives  . Penicillins Hives and Other (See Comments)    Has patient had a PCN reaction causing immediate rash, facial/tongue/throat swelling, SOB or lightheadedness with hypotension: No Has patient had a PCN reaction causing severe rash involving mucus membranes or skin necrosis: No Has patient had a PCN reaction that required  hospitalization No Has patient had a PCN reaction occurring within the last 10 years: No If all of the above answers are "NO", then may proceed with Cephalosporin use.    VITALS:  Blood pressure (!) 128/57, pulse 77, temperature 99 F (37.2 C), temperature source Oral, resp. rate (!) 9, height 5\' 9"  (1.753 m), weight (!) 138 kg, SpO2 99 %.  PHYSICAL EXAMINATION:  Constitutional: Appears morbidly obese no distress. HENT: Normocephalic. Marland Kitchen Oropharynx is clear and moist.  Eyes: Conjunctivae and EOM are normal. PERRLA, no scleral icterus.  Neck: Normal ROM. Neck supple. No JVD. No tracheal deviation. CVS: RRR, S1/S2 +, no murmurs, no gallops, no carotid bruit.  Pulmonary: Effort and breath sounds normal, no stridor, rhonchi, wheezes, rales.  Abdominal: Soft. BS +,  no distension, tenderness, rebound or guarding.  Musculoskeletal: Normal range of motion. 3+ LEE with chronic venous stasis .  Neuro: Alert. CN 2-12 grossly intact. No focal deficits. Skin: serous weeping with maceration of the dorsal foot and some maceration in the web spaces of the right foot. Noted serous weeping on the posterior calf    Psychiatric: Normal mood and affect.      LABORATORY PANEL:   CBC Recent Labs  Lab 11/02/18 2231  WBC 7.7  HGB 10.5*  HCT 32.7*  PLT 248   ------------------------------------------------------------------------------------------------------------------  Chemistries  Recent Labs  Lab 11/02/18 1235 11/02/18 2231  NA 138 140  K 4.0 3.7  CL 101 105  CO2 27 27  GLUCOSE 137* 189*  BUN 18 17  CREATININE 0.79 0.94  CALCIUM 8.8* 8.2*  AST 19  --   ALT 13  --   ALKPHOS 56  --   BILITOT 0.5  --    ------------------------------------------------------------------------------------------------------------------  Cardiac  Enzymes No results for input(s): TROPONINI in the last 168  hours. ------------------------------------------------------------------------------------------------------------------  RADIOLOGY:  Dg Foot 2 Views Right  Result Date: 11/02/2018 CLINICAL DATA:  Bilateral leg swelling and drainage. EXAM: RIGHT FOOT - 2 VIEW COMPARISON:  None. FINDINGS: There is no evidence of fracture or dislocation. Degenerative joint changes with narrowed joint space and osteophyte formation are identified in the first digit. There is no bony destruction to suggest osteomyelitis. IMPRESSION: No plain film evidence of osteomyelitis. Degenerative joint changes of the right foot. Electronically Signed   By: Abelardo Diesel M.D.   On: 11/02/2018 13:46     ASSESSMENT AND PLAN:   73 y/o male with morbid obesity, chronic venous changes and lymphedema, chronic DVT RLE, DM and HTN who was sent by home health care nurse due to concern of lower extremity cellulitis.  1.  Bilateral lower extremity cellulitis with chronic venous stasis: Patient recently seen by vascular and placed on doxycycline Vascular surgery consultation requested and discussed with surgeons. Continue wound care as prescribed. Follow-up on final cultures Continue IV vancomycin  Patient will need compression therapy after cellulitis is improved.   2.  Diabetes: Continue Lantus with sliding scale and ADA diet  3.  Essential hypertension: Continue metoprolol and lisinopril  4.  Hypothyroidism: Continue Synthroid  5.  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  Hyperlipidemia: Continue statin     Management plans discussed with the patient and he is in agreement.  CODE STATUS:dnr  TOTAL TIME TAKING CARE OF THIS PATIENT: 30 minutes.     POSSIBLE D/C 1-2 days, DEPENDING ON CLINICAL CONDITION.   Bettey Costa M.D on 11/03/2018 at 11:23 AM  Between 7am to 6pm - Pager - [phone] After 6pm go to [email or web address] - password [email or web address] [facility]  Office   [phone]  CC: Primary care physician; Patient, No Pcp Per  Note: This dictation was prepared with Dragon dictation along with smaller phrase technology. Any transcriptional errors that result from this process are unintentional.

## 2018-11-03 NOTE — Evaluation (Addendum)
Physical Therapy Evaluation Patient Details Name: Mario Blackburn MRN: [id number] DOB: Apr 21, 1946 Today's Date: 11/03/2018   History of Present Illness  Patient is a 73 year old male admitted with Right LE cellulitis. PMH to include: dementia, HTN, HLD, DM, morbid obesity.   Clinical Impression  Patient received in bed, reports right LE pain. Patient reports [facility] and [facility] due to his interactions with staff. Patient would like to go home at discharge, lives with wife. Patient has lift and wheelchair at home that were provided by [facility] which he will no longer have access to. Patient requires mod assist +2 for supine to sit transfer and mod +2 sit to stand transfer. Patient is only able to stand briefly with RW and +2 assist ( < 5 seconds) due to right LE pain when weight bearing. Patient requires min assist for sit to supine and total assist of 2 to slide up to head of bed in supine. Patient suffers from LE cellulitis and CHF limiting his ability to participate in daily activities. A walker will not be of benefit. Patient will be able to be safer and more independent with use of manual wheelchair that he can self propel. Patient will benefit from continued skilled PT while here to improve functional mobility, independence and strength.        Follow Up Recommendations SNF    Equipment Recommendations  Wheelchair (measurements PT)    Recommendations for Other Services       Precautions / Restrictions Precautions Precautions: Fall Restrictions Weight Bearing Restrictions: No      Mobility  Bed Mobility Overal bed mobility: Needs Assistance Bed Mobility: Supine to Sit;Sit to Supine     Supine to sit: +2 for physical assistance;Mod assist Sit to supine: +2 for physical assistance;Min assist   General bed mobility comments: total assist for supine scooting up in bed.   Transfers Overall transfer level: Needs assistance Equipment  used: Rolling walker (2 wheeled) Transfers: Sit to/from Stand Sit to Stand: +2 physical assistance;From elevated surface         General transfer comment: patient is only able to stand briefly < 5 seconds before needing to sit back down due to pain in R foot.   Ambulation/Gait             General Gait Details: patient is not ambulatory, requires use of wheelchair for home mobility due to pain in right knee, pain in R foot/ LE  Stairs            Wheelchair Mobility    Modified Rankin (Stroke Patients Only)       Balance Overall balance assessment: Needs assistance Sitting-balance support: Bilateral upper extremity supported;Feet supported Sitting balance-Leahy Scale: Fair     Standing balance support: Bilateral upper extremity supported Standing balance-Leahy Scale: Fair                               Pertinent Vitals/Pain Pain Assessment: Faces Faces Pain Scale: Hurts worst Pain Location: R LE, back right shoulder, R knee Pain Descriptors / Indicators: Aching;Discomfort;Sore;Constant Pain Intervention(s): Limited activity within patient's tolerance;Monitored during session;Repositioned    Home Living Family/patient expects to be discharged to:: Private residence Living Arrangements: Spouse/significant other Available Help at Discharge: Family Type of Home: House Home Access: Ramped entrance     Home Layout: One level Home Equipment: Environmental consultant - 2 wheels Additional Comments: Has  wheelchair and lift that he was using [facility] however [facility] is no longer seeing patient and will be taking this equipment.      Prior Function Level of Independence: Needs assistance   Gait / Transfers Assistance Needed: Patient is not ambulatory at baseline. Was able to pivot transfer with assistance prior to cellulitis.   ADL's / Homemaking Assistance Needed: Requires assistance        Hand Dominance        Extremity/Trunk Assessment   Upper Extremity  Assessment Upper Extremity Assessment: Generalized weakness    Lower Extremity Assessment Lower Extremity Assessment: Generalized weakness       Communication   Communication: HOH  Cognition Arousal/Alertness: Awake/alert Behavior During Therapy: WFL for tasks assessed/performed Overall Cognitive Status: Within Functional Limits for tasks assessed                                        General Comments      Exercises     Assessment/Plan    PT Assessment Patient needs continued PT services  PT Problem List Decreased strength;Pain;Decreased range of motion;Decreased mobility;Decreased knowledge of use of DME;Obesity;Decreased activity tolerance;Decreased skin integrity       PT Treatment Interventions DME instruction;Functional mobility training;Patient/family education;Gait training;Therapeutic activities;Therapeutic exercise    PT Goals (Current goals can be found in the Care Plan section)  Acute Rehab PT Goals Patient Stated Goal: to return home PT Goal Formulation: With patient Time For Goal Achievement: 11/10/18 Potential to Achieve Goals: Fair    Frequency Min 2X/week   Barriers to discharge Decreased caregiver support      Co-evaluation               AM-PAC PT "6 Clicks" Mobility  Outcome Measure Help needed turning from your back to your side while in a flat bed without using bedrails?: A Lot Help needed moving from lying on your back to sitting on the side of a flat bed without using bedrails?: A Lot Help needed moving to and from a bed to a chair (including a wheelchair)?: Total Help needed standing up from a chair using your arms (e.g., wheelchair or bedside chair)?: A Lot Help needed to walk in hospital room?: Total Help needed climbing 3-5 steps with a railing? : Total 6 Click Score: 9    End of Session Equipment Utilized During Treatment: Gait belt Activity Tolerance: Patient limited by pain Patient left: in bed;with bed  alarm set;with family/visitor present;with call bell/phone within reach Nurse Communication: Mobility status PT Visit Diagnosis: Muscle weakness (generalized) (M62.81);Pain;Other abnormalities of gait and mobility (R26.89) Pain - Right/Left: Right Pain - part of body: Shoulder;Knee;Ankle and joints of foot    Time: 9833-8250 PT Time Calculation (min) (ACUTE ONLY): 21 min   Charges:   PT Evaluation $PT Eval Moderate Complexity: 1 Mod PT Treatments $Therapeutic Activity: 8-22 mins        Lakota Markgraf, PT, GCS 11/03/18,3:27 PM

## 2018-11-03 NOTE — TOC Initial Note (Signed)
Transition of Care [facility]) - Initial/Assessment Note    Patient Details  Name: Mario Blackburn MRN: [id number] Date of Birth: 30-May-1946  [facility] CM/SW Contact:    Beverly Sessions, RN Phone Number: 11/03/2018, 11:17 AM  Clinical Narrative:                  Patient admitted from home with cellulitis.  Patient lives at  Home with wife.  RNCM contacted wife and left voicemail to complete assessment.  Awaiting return call.   Patient open with [facility] home health for RN.  Malachy Mood with [facility] notified of admission   Expected Discharge Plan: [place] Barriers to Discharge: Continued Medical Work up   Patient Goals and CMS Choice        Expected Discharge Plan and Services Expected Discharge Plan: [place] Discharge Planning Services: CM Consult   Living arrangements for the past 2 months: Single Family Home Expected Discharge Date: 11/05/18                   HH Arranged: RN Protection Agency: [place]  Prior Living Arrangements/Services Living arrangements for the past 2 months: Single Family Home Lives with:: Spouse              Current home services: Home RN    Activities of Daily Living Home Assistive Devices/Equipment: Wheelchair ADL Screening (condition at time of admission) Patient's cognitive ability adequate to safely complete daily activities?: Yes Is the patient deaf or have difficulty hearing?: No Does the patient have difficulty seeing, even when wearing glasses/contacts?: No Does the patient have difficulty concentrating, remembering, or making decisions?: No Patient able to express need for assistance with ADLs?: Yes Does the patient have difficulty dressing or bathing?: Yes Independently performs ADLs?: No Communication: Independent Dressing (OT): Needs assistance Is this a change from baseline?: Pre-admission baseline Grooming: Needs assistance Is this a change from baseline?:  Pre-admission baseline Feeding: Independent Bathing: Needs assistance Is this a change from baseline?: Pre-admission baseline Toileting: Needs assistance Is this a change from baseline?: Pre-admission baseline In/Out Bed: Dependent Is this a change from baseline?: Pre-admission baseline Walks in Home: Dependent Is this a change from baseline?: Pre-admission baseline Does the patient have difficulty walking or climbing stairs?: Yes Weakness of Legs: Both Weakness of Arms/Hands: None  Permission Sought/Granted                  Emotional Assessment              Admission diagnosis:  Cellulitis of right lower extremity [L03.115] Patient Active Problem List   Diagnosis Date Noted  . Cellulitis 11/02/2018  . Chronic venous insufficiency 10/20/2018  . Lymphedema 10/20/2018  . Bilateral lower extremity edema 09/29/2018  . Pressure injury of skin 09/15/2018  . Diabetes ([place]) 09/12/2018  . HTN (hypertension) 09/12/2018  . HLD (hyperlipidemia) 09/12/2018  . Chronic diastolic CHF (congestive heart failure) ([place]) 09/12/2018  . COPD (chronic obstructive pulmonary disease) ([place]) 09/12/2018  . Hypoxia 08/20/2015  . Chest pain 04/27/2015   PCP:  Patient, No Pcp Per Pharmacy:   [place], [place] - [place] [address] Phone: [phone] Fax: [phone]  [facility] [address], [place] - [place] [address] Phone: [phone] Fax: [phone]     Social Determinants of Health (SDOH) Interventions    Readmission  Risk Interventions 30 Day Unplanned Readmission Risk Score     ED to Hosp-Admission (Current) from 11/02/2018 in [facility]  30 Day Unplanned Readmission Risk Score (%)  23 Filed at 11/03/2018 0801     This score is the patient's risk of an unplanned readmission within 30 days of being discharged (0 -100%). The  score is based on dignosis, age, lab data, medications, orders, and past utilization.   Low:  0-14.9   Medium: 15-21.9   High: 22-29.9   Extreme: 30 and above       No flowsheet data found.

## 2018-11-04 LAB — GLUCOSE, CAPILLARY
Glucose-Capillary: 107 mg/dL — ABNORMAL HIGH (ref 70–99)
Glucose-Capillary: 151 mg/dL — ABNORMAL HIGH (ref 70–99)

## 2018-11-04 LAB — CREATININE, SERUM
Creatinine, Ser: 0.69 mg/dL (ref 0.61–1.24)
GFR calc Af Amer: >60 mL/min (ref >60)
GFR calc non Af Amer: >60 mL/min (ref >60)

## 2018-11-04 MED ORDER — CLINDAMYCIN HCL 150 MG PO CAPS
450.0000 mg | ORAL_CAPSULE | Freq: Three times a day (TID) | ORAL | Status: DC
  Administered 2018-11-04: 450 mg via ORAL
  Filled 2018-11-04 (×2): qty 3

## 2018-11-04 MED ORDER — CLINDAMYCIN HCL 150 MG PO CAPS: 450.0000 mg | ORAL_CAPSULE | Freq: Three times a day (TID) | ORAL | 0 refills | Status: DC

## 2018-11-04 NOTE — Progress Notes (Signed)
MD ordered patient to be discharged home.  Discharge instructions were reviewed with the patient and wife and they voiced understanding.  Follow-up appointment was made.  Prescriptions given to the patient.  IV was removed with catheter intact.  All patients questions were answered.  Patient was discharged via wheelchair escorted by nursing.

## 2018-11-04 NOTE — Care Management (Signed)
PatientsuffersfromCHFandhastroublebreathingatnightwhenheadiselevatedlessthan30degrees.Bedwedgesdonotprovideenoughelevationtoresolvebreathingissues.  SOB causepatienttorequirefrequentchangesinbodypositionwhichcannotbeachievedwithanormalbed 

## 2018-11-04 NOTE — Discharge Summary (Signed)
[facility] [place] NAME: Mario Blackburn    MR#:  [id number]  DATE OF BIRTH:  09-11-1945  DATE OF ADMISSION:  11/02/2018 ADMITTING PHYSICIAN: Hillary Bow, MD  DATE OF DISCHARGE: 11/04/2018   PRIMARY CARE PHYSICIAN: Patient, No Pcp Per    ADMISSION DIAGNOSIS:  Cellulitis of right lower extremity [V78.469]  DISCHARGE DIAGNOSIS:  Active Problems:   Cellulitis   SECONDARY DIAGNOSIS:   Past Medical History:  Diagnosis Date  . Arthritis   . Asthma   . Cancer ([place])   . CHF (congestive heart failure) ([place])   . Diabetes mellitus without complication ([place])   . Gout   . Hyperlipemia   . Hypertension   . Osteoarthritis   . Stroke [facility])     HOSPITAL COURSE:  73 y/o male with morbid obesity, chronic venous changes and lymphedema, chronic DVT RLE, DM and HTN who was sent by home health care nurse due to concern of lower extremity cellulitis.  1.  Bilateral lower extremity cellulitis with chronic venous stasis:  Patient was recently treated as outpatient with doxycycline.  He was placed on IV vancomycin while in the hospital.  He will be discharged with oral clindamycin.  Cellulitis show some improvement.  Patient was evaluated by vascular while in the hospital and recommendations are as follows: Recommend continued three layer zinc oxide unna wraps changed at least once weekly or more often as the patient has presented to the office with his unna wraps soaked in urine.  Elevation heart level or higher including during the day.  Recent ABI without significant peripheral artery disease.  There is no indication for any endovascular intervention at this time Patient will need compression therapy after cellulitis is improved.  He was also followed by wound care while in the hospital.  He will continue home health with wound care dressing changes.   2.  Diabetes: We will continue with outpatient regimen and ADA diet.    3.  Essential  hypertension: Continue metoprolol and lisinopril  4.  Hypothyroidism: Continue Synthroid  5.  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  Hyperlipidemia: Continue statin   DISCHARGE CONDITIONS AND DIET:   Stable for discharge Heart healthy diabetic diet  CONSULTS OBTAINED:    DRUG ALLERGIES:   Allergies  Allergen Reactions  . Morphine And Related Other (See Comments)    Pt states that this medication put him in a coma.  . Percocet [Oxycodone-Acetaminophen] Other (See Comments)    Pt states that this medication put him in a coma.  . Benadryl [Diphenhydramine Hcl] Hives  . Keflex [Cephalexin] Hives  . Penicillins Hives and Other (See Comments)    Has patient had a PCN reaction causing immediate rash, facial/tongue/throat swelling, SOB or lightheadedness with hypotension: No Has patient had a PCN reaction causing severe rash involving mucus membranes or skin necrosis: No Has patient had a PCN reaction that required hospitalization No Has patient had a PCN reaction occurring within the last 10 years: No If all of the above answers are "NO", then may proceed with Cephalosporin use.  . Bactrim [Sulfamethoxazole-Trimethoprim] Other (See Comments)    Reaction:  Rash- Care Everywhere (chart review) 06/2018 note    DISCHARGE MEDICATIONS:   Allergies as of 11/04/2018      Reactions   Morphine And Related Other (See Comments)   Pt states that this medication put him in a coma.   Percocet [oxycodone-acetaminophen] Other (See Comments)  Pt states that this medication put him in a coma.   Benadryl [diphenhydramine Hcl] Hives   Keflex [cephalexin] Hives   Penicillins Hives, Other (See Comments)   Has patient had a PCN reaction causing immediate rash, facial/tongue/throat swelling, SOB or lightheadedness with hypotension: No Has patient had a PCN reaction causing severe rash involving mucus membranes or skin necrosis: No Has patient had a PCN reaction that  required hospitalization No Has patient had a PCN reaction occurring within the last 10 years: No If all of the above answers are "NO", then may proceed with Cephalosporin use.   Bactrim [sulfamethoxazole-trimethoprim] Other (See Comments)   Reaction:  Rash- Care Everywhere (chart review) 06/2018 note      Medication List    TAKE these medications   acetaminophen 650 MG CR tablet Commonly known as:  TYLENOL Take 650 mg by mouth 2 (two) times daily.   albuterol (2.5 MG/3ML) 0.083% nebulizer solution Commonly known as:  PROVENTIL Take 2.5 mg by nebulization 4 (four) times daily as needed for wheezing or shortness of breath.   amLODipine 10 MG tablet Commonly known as:  NORVASC Take 1 tablet (10 mg total) by mouth at bedtime.   ARIPiprazole 5 MG tablet Commonly known as:  ABILIFY Take 5 mg by mouth daily.   aspirin EC 81 MG tablet Take 81 mg by mouth daily.   atorvastatin 80 MG tablet Commonly known as:  LIPITOR Take 80 mg by mouth at bedtime.   Benzocaine-Menthol 15-3.6 MG Lozg Take 1 lozenge by mouth as needed.   clindamycin 150 MG capsule Commonly known as:  CLEOCIN Take 3 capsules (450 mg total) by mouth every 8 (eight) hours for 7 days.   Combigan 0.2-0.5 % ophthalmic solution Generic drug:  brimonidine-timolol Place 1 drop into both eyes 2 (two) times daily.   Desitin 13 % Crea Generic drug:  Zinc Oxide Apply 1 application topically 2 (two) times daily. Apply to scrotum   diclofenac sodium 1 % Gel Commonly known as:  VOLTAREN Apply 2-4 g topically 2 (two) times daily. Apply 2g to right shoulder and 4g to right knee   divalproex 500 MG 24 hr tablet Commonly known as:  DEPAKOTE ER Take 1,000 mg by mouth 2 (two) times daily.   fluticasone 50 MCG/ACT nasal spray Commonly known as:  FLONASE Place 1 spray into both nostrils daily.   Fluticasone-Salmeterol 250-50 MCG/DOSE Aepb Commonly known as:  ADVAIR Inhale 1 puff into the lungs 2 (two) times daily.    furosemide 40 MG tablet Commonly known as:  LASIX Take 40 mg by mouth daily.   gabapentin 300 MG capsule Commonly known as:  NEURONTIN Take 600 mg by mouth at bedtime.   insulin aspart 100 UNIT/ML injection Commonly known as:  novoLOG Inject 12-14 Units into the skin 2 (two) times daily with a meal. 12 units with breakfast and 14 units with dinner   insulin glargine 100 UNIT/ML injection Commonly known as:  LANTUS Inject 10 Units into the skin at bedtime.   ipratropium 0.03 % nasal spray Commonly known as:  ATROVENT Place 2 sprays into the nose 3 (three) times daily.   levothyroxine 50 MCG tablet Commonly known as:  SYNTHROID, LEVOTHROID Take 50 mcg by mouth daily before breakfast.   lisinopril 40 MG tablet Commonly known as:  PRINIVIL,ZESTRIL Take 40 mg by mouth 2 (two) times daily.   loperamide 2 MG tablet Commonly known as:  IMODIUM A-D Take 2 mg by mouth every hour as needed  for diarrhea or loose stools.   Lumigan 0.01 % Soln Generic drug:  bimatoprost Place 1 drop into both eyes at bedtime.   magnesium oxide 400 MG tablet Commonly known as:  MAG-OX Take 600 mg by mouth daily.   metoprolol succinate 25 MG 24 hr tablet Commonly known as:  TOPROL-XL Take 25 mg by mouth daily.   mupirocin ointment 2 % Commonly known as:  BACTROBAN Place 1 application into the nose 2 (two) times daily.   nitroGLYCERIN 0.4 MG SL tablet Commonly known as:  NITROSTAT Place 0.4 mg under the tongue every 5 (five) minutes as needed for chest pain.   nystatin-triamcinolone cream Commonly known as:  MYCOLOG II Apply 1 application topically 2 (two) times daily.   pantoprazole 40 MG tablet Commonly known as:  PROTONIX Take 1 tablet (40 mg total) by mouth daily.   ranitidine 150 MG tablet Commonly known as:  ZANTAC Take 150 mg by mouth 2 (two) times daily.   spironolactone 25 MG tablet Commonly known as:  ALDACTONE Take 25 mg by mouth daily.   tiotropium 18 MCG inhalation  capsule Commonly known as:  SPIRIVA Place 1 capsule (18 mcg total) into inhaler and inhale daily.   traMADol 50 MG tablet Commonly known as:  ULTRAM Take 50 mg by mouth every 6 (six) hours as needed for pain.   venlafaxine XR 75 MG 24 hr capsule Commonly known as:  EFFEXOR-XR Take 75 mg by mouth daily. Take with 150mg  capsule for 225mg    venlafaxine XR 150 MG 24 hr capsule Commonly known as:  EFFEXOR-XR Take 150 mg by mouth daily with breakfast. Take with 75mg  capsule for 225mg    vitamin A & D ointment Apply 1 application topically 3 (three) times daily.            Durable Medical Equipment  (From admission, onward)         Start     Ordered   11/03/18 1502  For home use only DME Hospital bed  Once    Question Answer Comment  Patient has (list medical condition): CHF   The above medical condition requires: Patient requires the ability to reposition frequently   Head must be elevated greater than: 30 degrees   Bed type Semi-electric   Hoyer Lift Yes   Support Surface: Gel Overlay      11/03/18 1501   11/03/18 1458  For home use only DME lightweight manual wheelchair with seat cushion  Once    Comments:  Patient suffers from lower extremity cellulitis and CHF which impairs their ability to perform daily activities like  in the home.  A rolling walker will not resolve  issue with performing activities of daily living. A wheelchair will allow patient to safely perform daily activities. Patient is not able to propel themselves in the home using a standard weight wheelchair due to weakness. Patient can self propel in the lightweight wheelchair.  Accessories: elevating leg rests (ELRs), wheel locks, extensions and anti-tippers, back cushion   11/03/18 1501            Today   CHIEF COMPLAINT:   No acute issues overnight   VITAL SIGNS:  Blood pressure (!) 138/59, pulse 71, temperature 98.2 F (36.8 C), temperature source Oral, resp. rate 20, height 5\' 9"  (1.753 m),  weight (!) 139.7 kg, SpO2 95 %.   REVIEW OF SYSTEMS:  Review of Systems  Constitutional: Negative.  Negative for chills, fever and malaise/fatigue.  HENT: Negative.  Negative  for ear discharge, ear pain, hearing loss, nosebleeds and sore throat.   Eyes: Negative.  Negative for blurred vision and pain.  Respiratory: Negative.  Negative for cough, hemoptysis, shortness of breath and wheezing.   Cardiovascular: Negative.  Negative for chest pain, palpitations and leg swelling.  Gastrointestinal: Negative.  Negative for abdominal pain, blood in stool, diarrhea, nausea and vomiting.  Genitourinary: Negative.  Negative for dysuria.  Musculoskeletal: Negative.  Negative for back pain.  Skin:       Chronic venous stasis cellul;tiis  Neurological: Negative for dizziness, tremors, speech change, focal weakness, seizures and headaches.  Endo/Heme/Allergies: Negative.  Does not bruise/bleed easily.  Psychiatric/Behavioral: Positive for memory loss. Negative for depression, hallucinations and suicidal ideas.     PHYSICAL EXAMINATION:  GENERAL:  72 y.o.-year-old patient lying in the bed with no acute distress.  NECK:  Supple, no jugular venous distention. No thyroid enlargement, no tenderness.  LUNGS: Normal breath sounds bilaterally, no wheezing, rales,rhonchi  No use of accessory muscles of respiration.  CARDIOVASCULAR: S1, S2 normal. No murmurs, rubs, or gallops.  ABDOMEN: Soft, non-tender, non-distended. Bowel sounds present. No organomegaly or mass.  EXTREMITIES: No pedal edema, cyanosis, or clubbing.  PSYCHIATRIC: The patient is alert and oriented x 3.  SKIN: serous weeping with maceration of the dorsal foot and some maceration in the web spaces of the right foot. Noted serous weeping on the posterior calf   Maceration noted between toes.   DATA REVIEW:   CBC Recent Labs  Lab 11/02/18 2231  WBC 7.7  HGB 10.5*  HCT 32.7*  PLT 248    Chemistries  Recent Labs  Lab  11/02/18 1235 11/02/18 2231 11/04/18 0730  NA 138 140  --   K 4.0 3.7  --   CL 101 105  --   CO2 27 27  --   GLUCOSE 137* 189*  --   BUN 18 17  --   CREATININE 0.79 0.94 0.69  CALCIUM 8.8* 8.2*  --   AST 19  --   --   ALT 13  --   --   ALKPHOS 56  --   --   BILITOT 0.5  --   --     Cardiac Enzymes No results for input(s): TROPONINI in the last 168 hours.  Microbiology Results  @[email or web address] @  RADIOLOGY:  Dg Foot 2 Views Right  Result Date: 11/02/2018 CLINICAL DATA:  Bilateral leg swelling and drainage. EXAM: RIGHT FOOT - 2 VIEW COMPARISON:  None. FINDINGS: There is no evidence of fracture or dislocation. Degenerative joint changes with narrowed joint space and osteophyte formation are identified in the first digit. There is no bony destruction to suggest osteomyelitis. IMPRESSION: No plain film evidence of osteomyelitis. Degenerative joint changes of the right foot. Electronically Signed   By: Abelardo Diesel M.D.   On: 11/02/2018 13:46      Allergies as of 11/04/2018      Reactions   Morphine And Related Other (See Comments)   Pt states that this medication put him in a coma.   Percocet [oxycodone-acetaminophen] Other (See Comments)   Pt states that this medication put him in a coma.   Benadryl [diphenhydramine Hcl] Hives   Keflex [cephalexin] Hives   Penicillins Hives, Other (See Comments)   Has patient had a PCN reaction causing immediate rash, facial/tongue/throat swelling, SOB or lightheadedness with hypotension: No Has patient had a PCN reaction causing severe rash involving mucus membranes or skin necrosis: No Has patient had a  PCN reaction that required hospitalization No Has patient had a PCN reaction occurring within the last 10 years: No If all of the above answers are "NO", then may proceed with Cephalosporin use.   Bactrim [sulfamethoxazole-trimethoprim] Other (See Comments)   Reaction:  Rash- Care Everywhere (chart review) 06/2018 note      Medication List     TAKE these medications   acetaminophen 650 MG CR tablet Commonly known as:  TYLENOL Take 650 mg by mouth 2 (two) times daily.   albuterol (2.5 MG/3ML) 0.083% nebulizer solution Commonly known as:  PROVENTIL Take 2.5 mg by nebulization 4 (four) times daily as needed for wheezing or shortness of breath.   amLODipine 10 MG tablet Commonly known as:  NORVASC Take 1 tablet (10 mg total) by mouth at bedtime.   ARIPiprazole 5 MG tablet Commonly known as:  ABILIFY Take 5 mg by mouth daily.   aspirin EC 81 MG tablet Take 81 mg by mouth daily.   atorvastatin 80 MG tablet Commonly known as:  LIPITOR Take 80 mg by mouth at bedtime.   Benzocaine-Menthol 15-3.6 MG Lozg Take 1 lozenge by mouth as needed.   clindamycin 150 MG capsule Commonly known as:  CLEOCIN Take 3 capsules (450 mg total) by mouth every 8 (eight) hours for 7 days.   Combigan 0.2-0.5 % ophthalmic solution Generic drug:  brimonidine-timolol Place 1 drop into both eyes 2 (two) times daily.   Desitin 13 % Crea Generic drug:  Zinc Oxide Apply 1 application topically 2 (two) times daily. Apply to scrotum   diclofenac sodium 1 % Gel Commonly known as:  VOLTAREN Apply 2-4 g topically 2 (two) times daily. Apply 2g to right shoulder and 4g to right knee   divalproex 500 MG 24 hr tablet Commonly known as:  DEPAKOTE ER Take 1,000 mg by mouth 2 (two) times daily.   fluticasone 50 MCG/ACT nasal spray Commonly known as:  FLONASE Place 1 spray into both nostrils daily.   Fluticasone-Salmeterol 250-50 MCG/DOSE Aepb Commonly known as:  ADVAIR Inhale 1 puff into the lungs 2 (two) times daily.   furosemide 40 MG tablet Commonly known as:  LASIX Take 40 mg by mouth daily.   gabapentin 300 MG capsule Commonly known as:  NEURONTIN Take 600 mg by mouth at bedtime.   insulin aspart 100 UNIT/ML injection Commonly known as:  novoLOG Inject 12-14 Units into the skin 2 (two) times daily with a meal. 12 units with  breakfast and 14 units with dinner   insulin glargine 100 UNIT/ML injection Commonly known as:  LANTUS Inject 10 Units into the skin at bedtime.   ipratropium 0.03 % nasal spray Commonly known as:  ATROVENT Place 2 sprays into the nose 3 (three) times daily.   levothyroxine 50 MCG tablet Commonly known as:  SYNTHROID, LEVOTHROID Take 50 mcg by mouth daily before breakfast.   lisinopril 40 MG tablet Commonly known as:  PRINIVIL,ZESTRIL Take 40 mg by mouth 2 (two) times daily.   loperamide 2 MG tablet Commonly known as:  IMODIUM A-D Take 2 mg by mouth every hour as needed for diarrhea or loose stools.   Lumigan 0.01 % Soln Generic drug:  bimatoprost Place 1 drop into both eyes at bedtime.   magnesium oxide 400 MG tablet Commonly known as:  MAG-OX Take 600 mg by mouth daily.   metoprolol succinate 25 MG 24 hr tablet Commonly known as:  TOPROL-XL Take 25 mg by mouth daily.   mupirocin ointment 2 %  Commonly known as:  BACTROBAN Place 1 application into the nose 2 (two) times daily.   nitroGLYCERIN 0.4 MG SL tablet Commonly known as:  NITROSTAT Place 0.4 mg under the tongue every 5 (five) minutes as needed for chest pain.   nystatin-triamcinolone cream Commonly known as:  MYCOLOG II Apply 1 application topically 2 (two) times daily.   pantoprazole 40 MG tablet Commonly known as:  PROTONIX Take 1 tablet (40 mg total) by mouth daily.   ranitidine 150 MG tablet Commonly known as:  ZANTAC Take 150 mg by mouth 2 (two) times daily.   spironolactone 25 MG tablet Commonly known as:  ALDACTONE Take 25 mg by mouth daily.   tiotropium 18 MCG inhalation capsule Commonly known as:  SPIRIVA Place 1 capsule (18 mcg total) into inhaler and inhale daily.   traMADol 50 MG tablet Commonly known as:  ULTRAM Take 50 mg by mouth every 6 (six) hours as needed for pain.   venlafaxine XR 75 MG 24 hr capsule Commonly known as:  EFFEXOR-XR Take 75 mg by mouth daily. Take with 150mg   capsule for 225mg    venlafaxine XR 150 MG 24 hr capsule Commonly known as:  EFFEXOR-XR Take 150 mg by mouth daily with breakfast. Take with 75mg  capsule for 225mg    vitamin A & D ointment Apply 1 application topically 3 (three) times daily.            Durable Medical Equipment  (From admission, onward)         Start     Ordered   11/03/18 1502  For home use only DME Hospital bed  Once    Question Answer Comment  Patient has (list medical condition): CHF   The above medical condition requires: Patient requires the ability to reposition frequently   Head must be elevated greater than: 30 degrees   Bed type Semi-electric   Hoyer Lift Yes   Support Surface: Gel Overlay      11/03/18 1501   11/03/18 1458  For home use only DME lightweight manual wheelchair with seat cushion  Once    Comments:  Patient suffers from lower extremity cellulitis and CHF which impairs their ability to perform daily activities like  in the home.  A rolling walker will not resolve  issue with performing activities of daily living. A wheelchair will allow patient to safely perform daily activities. Patient is not able to propel themselves in the home using a standard weight wheelchair due to weakness. Patient can self propel in the lightweight wheelchair.  Accessories: elevating leg rests (ELRs), wheel locks, extensions and anti-tippers, back cushion   11/03/18 1501              Management plans discussed with the patient and wife and he is in agreement. Stable for discharge   Patient should follow up with vascular  CODE STATUS:     Code Status Orders  (From admission, onward)         Start     Ordered   11/02/18 1510  Do not attempt resuscitation (DNR)  Continuous    Question Answer Comment  In the event of cardiac or respiratory ARREST Do not call a "code blue"   In the event of cardiac or respiratory ARREST Do not perform Intubation, CPR, defibrillation or ACLS   In the event of  cardiac or respiratory ARREST Use medication by any route, position, wound care, and other measures to relive pain and suffering. May use oxygen, suction and  manual treatment of airway obstruction as needed for comfort.      11/02/18 1509        Code Status History    Date Active Date Inactive Code Status Order ID Comments User Context   11/02/2018 1441 11/02/2018 1509 Full Code [id number]  Hillary Bow, MD ED   09/13/2018 0031 09/16/2018 2002 Full Code [id number]  Lance Coon, MD Inpatient   08/20/2015 1657 08/21/2015 1659 Full Code [id number]  Fritzi Mandes, MD Inpatient   04/27/2015 1420 04/28/2015 1418 Full Code [id number]  Theodoro Grist, MD ED    Advance Directive Documentation     Most Recent Value  Type of Advance Directive  Healthcare Power of Attorney  Pre-existing out of facility DNR order (yellow form or pink MOST form)  -  "MOST" Form in Place?  -      TOTAL TIME TAKING CARE OF THIS PATIENT: 39 minutes.    Note: This dictation was prepared with Dragon dictation along with smaller phrase technology. Any transcriptional errors that result from this process are unintentional.  Bettey Costa M.D on 11/04/2018 at 10:15 AM  Between 7am to 6pm - Pager - [phone] After 6pm go to [email or web address] - password [email or web address] [place] [facility]  Office  [phone]  CC: Primary care physician; Patient, No Pcp Per

## 2018-11-04 NOTE — TOC Transition Note (Signed)
Transition of Care [facility]) - CM/SW Discharge Note   Patient Details  Name: Mario Blackburn MRN: [id number] Date of Birth: Oct 05, 1945  [facility] CM/SW Contact:  Beverly Sessions, RN Phone Number: 11/04/2018, 1:37 PM   Clinical Narrative:     Patient to discharge today.  Resumption home health order have been entered.  Malachy Mood with [facility] notified of discharge. Per Leroy Sea with [employer] hospital bed, lift, and WC will be delivered to home today. Family to transport    Final next level of care: [place] Barriers to Discharge: Barriers Resolved   Patient Goals and CMS Choice        Discharge Placement                       Discharge Plan and Services   Discharge Planning Services: CM Consult            DME Arranged: Gel overlay, Hospital bed, Wheelchair manual, Other see comment(lift) DME Agency: [facility] HH Arranged: RN, PT, Nurse's Aide [place] Agency: [place]   Social Determinants of Health (SDOH) Interventions     Readmission Risk Interventions No flowsheet data found.

## 2018-11-04 NOTE — Consult Note (Signed)
Pharmacy Antibiotic Note  Mario Blackburn is a 73 y.o. male admitted on 11/02/2018 with cellulitis.  Pharmacy has been consulted for bactrim dosing. However, the patient has an allergy to bactrim, per Care Everywhere 06/2018 note, it causes a rash. Discussed with Dr. Benjie Karvonen to use clindamycin. The patient is currently on vancomycin 1250 mg every 12 hours.    Plan: After discussion with Dr. Benjie Karvonen, antibiotics will be changed Clindamycin 450 mg Q8H.  Height: 5\' 9"  (175.3 cm) Weight: (!) 307 lb 15.7 oz (139.7 kg) IBW/kg (Calculated) : 70.7  Temp (24hrs), Avg:98.4 F (36.9 C), Min:98.2 F (36.8 C), Max:98.7 F (37.1 C)  Recent Labs  Lab 11/02/18 1235 11/02/18 1322 11/02/18 2231 11/04/18 0730  WBC 10.8*  --  7.7  --   CREATININE 0.79  --  0.94 0.69  LATICACIDVEN  --  2.8* 1.4  --     Estimated Creatinine Clearance: 117.8 mL/min (by C-G formula based on SCr of 0.69 mg/dL).    Allergies  Allergen Reactions  . Morphine And Related Other (See Comments)    Pt states that this medication put him in a coma.  . Percocet [Oxycodone-Acetaminophen] Other (See Comments)    Pt states that this medication put him in a coma.  . Benadryl [Diphenhydramine Hcl] Hives  . Keflex [Cephalexin] Hives  . Penicillins Hives and Other (See Comments)    Has patient had a PCN reaction causing immediate rash, facial/tongue/throat swelling, SOB or lightheadedness with hypotension: No Has patient had a PCN reaction causing severe rash involving mucus membranes or skin necrosis: No Has patient had a PCN reaction that required hospitalization No Has patient had a PCN reaction occurring within the last 10 years: No If all of the above answers are "NO", then may proceed with Cephalosporin use.  . Bactrim [Sulfamethoxazole-Trimethoprim] Other (See Comments)    Reaction:  Rash- Care Everywhere (chart review) 06/2018 note    Antimicrobials this admission: Vancomycin 3/17 >> 3/19 Clindamycin 3/19 >>>   Dose  adjustments this admission: Vancomycin 3/18 - maintenance dose  Microbiology results: Collected 11/02/18 BCx: pending UCx: N/A  Sputum: N/A  Collected 11/02/2018 MRSA PCR: POSITIVE  Thank you for allowing pharmacy to be a part of this patient's care.  Rowland Lathe, PharmD 11/04/2018 9:11 AM

## 2018-11-05 ENCOUNTER — Encounter: Payer: Self-pay | Admitting: *Deleted

## 2018-11-05 ENCOUNTER — Inpatient Hospital Stay
Admission: EM | Admit: 2018-11-05 | Discharge: 2018-11-09 | DRG: 603 | Disposition: A | Discharge status: Home Health | Payer: Medicare Other | Attending: Internal Medicine | Admitting: Internal Medicine

## 2018-11-05 ENCOUNTER — Other Ambulatory Visit: Payer: Self-pay

## 2018-11-05 DIAGNOSIS — I5032 Chronic diastolic (congestive) heart failure: Secondary | ICD-10-CM | POA: Diagnosis present

## 2018-11-05 DIAGNOSIS — M1990 Unspecified osteoarthritis, unspecified site: Secondary | ICD-10-CM | POA: Diagnosis present

## 2018-11-05 DIAGNOSIS — Z7951 Long term (current) use of inhaled steroids: Secondary | ICD-10-CM

## 2018-11-05 DIAGNOSIS — Z7982 Long term (current) use of aspirin: Secondary | ICD-10-CM

## 2018-11-05 DIAGNOSIS — Z79899 Other long term (current) drug therapy: Secondary | ICD-10-CM

## 2018-11-05 DIAGNOSIS — L03115 Cellulitis of right lower limb: Secondary | ICD-10-CM

## 2018-11-05 DIAGNOSIS — T148XXA Other injury of unspecified body region, initial encounter: Secondary | ICD-10-CM

## 2018-11-05 DIAGNOSIS — E785 Hyperlipidemia, unspecified: Secondary | ICD-10-CM | POA: Diagnosis present

## 2018-11-05 DIAGNOSIS — Z79890 Hormone replacement therapy (postmenopausal): Secondary | ICD-10-CM

## 2018-11-05 DIAGNOSIS — Z885 Allergy status to narcotic agent status: Secondary | ICD-10-CM

## 2018-11-05 DIAGNOSIS — E119 Type 2 diabetes mellitus without complications: Secondary | ICD-10-CM | POA: Diagnosis present

## 2018-11-05 DIAGNOSIS — J449 Chronic obstructive pulmonary disease, unspecified: Secondary | ICD-10-CM | POA: Diagnosis present

## 2018-11-05 DIAGNOSIS — I110 Hypertensive heart disease with heart failure: Secondary | ICD-10-CM | POA: Diagnosis present

## 2018-11-05 DIAGNOSIS — Z888 Allergy status to other drugs, medicaments and biological substances status: Secondary | ICD-10-CM

## 2018-11-05 DIAGNOSIS — Z8673 Personal history of transient ischemic attack (TIA), and cerebral infarction without residual deficits: Secondary | ICD-10-CM

## 2018-11-05 DIAGNOSIS — R5381 Other malaise: Secondary | ICD-10-CM | POA: Diagnosis present

## 2018-11-05 DIAGNOSIS — Z66 Do not resuscitate: Secondary | ICD-10-CM | POA: Diagnosis present

## 2018-11-05 DIAGNOSIS — Z87891 Personal history of nicotine dependence: Secondary | ICD-10-CM

## 2018-11-05 DIAGNOSIS — Z882 Allergy status to sulfonamides status: Secondary | ICD-10-CM

## 2018-11-05 DIAGNOSIS — Z993 Dependence on wheelchair: Secondary | ICD-10-CM

## 2018-11-05 DIAGNOSIS — Z79891 Long term (current) use of opiate analgesic: Secondary | ICD-10-CM

## 2018-11-05 DIAGNOSIS — L0390 Cellulitis, unspecified: Secondary | ICD-10-CM | POA: Diagnosis present

## 2018-11-05 DIAGNOSIS — M109 Gout, unspecified: Secondary | ICD-10-CM | POA: Diagnosis present

## 2018-11-05 DIAGNOSIS — Z6841 Body Mass Index (BMI) 40.0 and over, adult: Secondary | ICD-10-CM

## 2018-11-05 DIAGNOSIS — Z794 Long term (current) use of insulin: Secondary | ICD-10-CM

## 2018-11-05 DIAGNOSIS — Z9049 Acquired absence of other specified parts of digestive tract: Secondary | ICD-10-CM

## 2018-11-05 DIAGNOSIS — L089 Local infection of the skin and subcutaneous tissue, unspecified: Secondary | ICD-10-CM

## 2018-11-05 DIAGNOSIS — Z880 Allergy status to penicillin: Secondary | ICD-10-CM

## 2018-11-05 DIAGNOSIS — E039 Hypothyroidism, unspecified: Secondary | ICD-10-CM | POA: Diagnosis present

## 2018-11-05 DIAGNOSIS — I878 Other specified disorders of veins: Secondary | ICD-10-CM | POA: Diagnosis present

## 2018-11-05 LAB — CBC
HCT: 41.7 % (ref 39.0–52.0)
Hemoglobin: 13.3 g/dL (ref 13.0–17.0)
MCH: 28.4 pg (ref 26.0–34.0)
MCHC: 31.9 g/dL (ref 30.0–36.0)
MCV: 88.9 fL (ref 80.0–100.0)
Platelets: 299 10*3/uL (ref 150–400)
RBC: 4.69 MIL/uL (ref 4.22–5.81)
RDW: 15.7 % — ABNORMAL HIGH (ref 11.5–15.5)
WBC: 10.4 10*3/uL (ref 4.0–10.5)
nRBC: 0 % (ref 0.0–0.2)

## 2018-11-05 LAB — BASIC METABOLIC PANEL
Anion gap: 10 (ref 5–15)
BUN: 21 mg/dL (ref 8–23)
CHLORIDE: 100 mmol/L (ref 98–111)
CO2: 29 mmol/L (ref 22–32)
Calcium: 9 mg/dL (ref 8.9–10.3)
Creatinine, Ser: 0.87 mg/dL (ref 0.61–1.24)
GFR calc Af Amer: >60 mL/min (ref >60)
GFR calc non Af Amer: >60 mL/min (ref >60)
GLUCOSE: 171 mg/dL — AB (ref 70–99)
Potassium: 4.5 mmol/L (ref 3.5–5.1)
Sodium: 139 mmol/L (ref 135–145)

## 2018-11-05 NOTE — ED Triage Notes (Signed)
Pt arrives with c/o right leg swelling and drainage. Pt says that he just does not feel any better since being discharged yesterday from the hospital.

## 2018-11-06 DIAGNOSIS — T148XXA Other injury of unspecified body region, initial encounter: Secondary | ICD-10-CM | POA: Diagnosis present

## 2018-11-06 DIAGNOSIS — Z6841 Body Mass Index (BMI) 40.0 and over, adult: Secondary | ICD-10-CM | POA: Diagnosis not present

## 2018-11-06 DIAGNOSIS — E039 Hypothyroidism, unspecified: Secondary | ICD-10-CM | POA: Diagnosis present

## 2018-11-06 DIAGNOSIS — Z79899 Other long term (current) drug therapy: Secondary | ICD-10-CM | POA: Diagnosis not present

## 2018-11-06 DIAGNOSIS — Z7982 Long term (current) use of aspirin: Secondary | ICD-10-CM | POA: Diagnosis not present

## 2018-11-06 DIAGNOSIS — Z9049 Acquired absence of other specified parts of digestive tract: Secondary | ICD-10-CM | POA: Diagnosis not present

## 2018-11-06 DIAGNOSIS — Z794 Long term (current) use of insulin: Secondary | ICD-10-CM | POA: Diagnosis not present

## 2018-11-06 DIAGNOSIS — Z79891 Long term (current) use of opiate analgesic: Secondary | ICD-10-CM | POA: Diagnosis not present

## 2018-11-06 DIAGNOSIS — I5032 Chronic diastolic (congestive) heart failure: Secondary | ICD-10-CM | POA: Diagnosis present

## 2018-11-06 DIAGNOSIS — Z882 Allergy status to sulfonamides status: Secondary | ICD-10-CM | POA: Diagnosis not present

## 2018-11-06 DIAGNOSIS — Z888 Allergy status to other drugs, medicaments and biological substances status: Secondary | ICD-10-CM | POA: Diagnosis not present

## 2018-11-06 DIAGNOSIS — E119 Type 2 diabetes mellitus without complications: Secondary | ICD-10-CM | POA: Diagnosis present

## 2018-11-06 DIAGNOSIS — Z8673 Personal history of transient ischemic attack (TIA), and cerebral infarction without residual deficits: Secondary | ICD-10-CM | POA: Diagnosis not present

## 2018-11-06 DIAGNOSIS — R5381 Other malaise: Secondary | ICD-10-CM | POA: Diagnosis present

## 2018-11-06 DIAGNOSIS — Z79890 Hormone replacement therapy (postmenopausal): Secondary | ICD-10-CM | POA: Diagnosis not present

## 2018-11-06 DIAGNOSIS — L03115 Cellulitis of right lower limb: Secondary | ICD-10-CM | POA: Diagnosis present

## 2018-11-06 DIAGNOSIS — Z880 Allergy status to penicillin: Secondary | ICD-10-CM | POA: Diagnosis not present

## 2018-11-06 DIAGNOSIS — Z87891 Personal history of nicotine dependence: Secondary | ICD-10-CM | POA: Diagnosis not present

## 2018-11-06 DIAGNOSIS — M109 Gout, unspecified: Secondary | ICD-10-CM | POA: Diagnosis present

## 2018-11-06 DIAGNOSIS — I878 Other specified disorders of veins: Secondary | ICD-10-CM | POA: Diagnosis present

## 2018-11-06 DIAGNOSIS — E785 Hyperlipidemia, unspecified: Secondary | ICD-10-CM | POA: Diagnosis present

## 2018-11-06 DIAGNOSIS — Z885 Allergy status to narcotic agent status: Secondary | ICD-10-CM | POA: Diagnosis not present

## 2018-11-06 DIAGNOSIS — M1990 Unspecified osteoarthritis, unspecified site: Secondary | ICD-10-CM | POA: Diagnosis present

## 2018-11-06 DIAGNOSIS — Z66 Do not resuscitate: Secondary | ICD-10-CM | POA: Diagnosis present

## 2018-11-06 DIAGNOSIS — I110 Hypertensive heart disease with heart failure: Secondary | ICD-10-CM | POA: Diagnosis present

## 2018-11-06 LAB — GLUCOSE, CAPILLARY
Glucose-Capillary: 134 mg/dL — ABNORMAL HIGH (ref 70–99)
Glucose-Capillary: 122 mg/dL — ABNORMAL HIGH (ref 70–99)
Glucose-Capillary: 131 mg/dL — ABNORMAL HIGH (ref 70–99)
Glucose-Capillary: 87 mg/dL (ref 70–99)

## 2018-11-06 LAB — TSH: TSH: 2.574 u[IU]/mL (ref 0.350–4.500)

## 2018-11-06 MED ORDER — DIVALPROEX SODIUM ER 500 MG PO TB24
1000.0000 mg | ORAL_TABLET | Freq: Two times a day (BID) | ORAL | Status: DC
  Administered 2018-11-06 – 2018-11-09 (×6): 1000 mg via ORAL
  Filled 2018-11-06 (×7): qty 2

## 2018-11-06 MED ORDER — AMLODIPINE BESYLATE 10 MG PO TABS
10.0000 mg | ORAL_TABLET | Freq: Every day | ORAL | Status: DC
  Administered 2018-11-06 – 2018-11-08 (×3): 10 mg via ORAL
  Filled 2018-11-06 (×3): qty 1

## 2018-11-06 MED ORDER — ACETAMINOPHEN 650 MG RE SUPP: 650.0000 mg | Freq: Four times a day (QID) | RECTAL | Status: DC | PRN

## 2018-11-06 MED ORDER — VITAMINS A & D EX OINT
1.0000 "application " | TOPICAL_OINTMENT | Freq: Three times a day (TID) | CUTANEOUS | Status: DC
  Administered 2018-11-06 – 2018-11-09 (×8): 1 via TOPICAL
  Filled 2018-11-06 (×2): qty 113

## 2018-11-06 MED ORDER — CLINDAMYCIN PHOSPHATE 600 MG/50ML IV SOLN
600.0000 mg | Freq: Once | INTRAVENOUS | Status: AC
  Administered 2018-11-06: 600 mg via INTRAVENOUS
  Filled 2018-11-06: qty 50

## 2018-11-06 MED ORDER — FUROSEMIDE 40 MG PO TABS
40.0000 mg | ORAL_TABLET | Freq: Every day | ORAL | Status: DC
  Administered 2018-11-06 – 2018-11-08 (×3): 40 mg via ORAL
  Filled 2018-11-06 (×3): qty 1

## 2018-11-06 MED ORDER — DOCUSATE SODIUM 100 MG PO CAPS
100.0000 mg | ORAL_CAPSULE | Freq: Two times a day (BID) | ORAL | Status: DC
  Administered 2018-11-06 – 2018-11-09 (×6): 100 mg via ORAL
  Filled 2018-11-06 (×6): qty 1

## 2018-11-06 MED ORDER — INSULIN ASPART 100 UNIT/ML ~~LOC~~ SOLN
14.0000 [IU] | Freq: Every day | SUBCUTANEOUS | Status: DC
  Administered 2018-11-06 – 2018-11-08 (×3): 14 [IU] via SUBCUTANEOUS
  Filled 2018-11-06 (×3): qty 1

## 2018-11-06 MED ORDER — LATANOPROST 0.005 % OP SOLN
1.0000 [drp] | Freq: Every day | OPHTHALMIC | Status: DC
  Administered 2018-11-06 – 2018-11-08 (×3): 1 [drp] via OPHTHALMIC
  Filled 2018-11-06: qty 2.5

## 2018-11-06 MED ORDER — VENLAFAXINE HCL ER 75 MG PO CP24
75.0000 mg | ORAL_CAPSULE | Freq: Every day | ORAL | Status: DC
  Administered 2018-11-07 – 2018-11-09 (×3): 75 mg via ORAL
  Filled 2018-11-06 (×4): qty 1

## 2018-11-06 MED ORDER — LISINOPRIL 20 MG PO TABS
40.0000 mg | ORAL_TABLET | Freq: Two times a day (BID) | ORAL | Status: DC
  Administered 2018-11-06 – 2018-11-09 (×6): 40 mg via ORAL
  Filled 2018-11-06 (×6): qty 2

## 2018-11-06 MED ORDER — ONDANSETRON HCL 4 MG PO TABS: 4.0000 mg | ORAL_TABLET | Freq: Four times a day (QID) | ORAL | Status: DC | PRN

## 2018-11-06 MED ORDER — ENOXAPARIN SODIUM 40 MG/0.4ML ~~LOC~~ SOLN
40.0000 mg | SUBCUTANEOUS | Status: DC
  Administered 2018-11-06 – 2018-11-07 (×2): 40 mg via SUBCUTANEOUS
  Filled 2018-11-06 (×2): qty 0.4

## 2018-11-06 MED ORDER — LEVOFLOXACIN IN D5W 750 MG/150ML IV SOLN
750.0000 mg | INTRAVENOUS | Status: DC
  Administered 2018-11-06 – 2018-11-08 (×3): 750 mg via INTRAVENOUS
  Filled 2018-11-06 (×4): qty 150

## 2018-11-06 MED ORDER — TIMOLOL MALEATE 0.5 % OP SOLN
1.0000 [drp] | Freq: Two times a day (BID) | OPHTHALMIC | Status: DC
  Administered 2018-11-06 – 2018-11-09 (×6): 1 [drp] via OPHTHALMIC
  Filled 2018-11-06: qty 5

## 2018-11-06 MED ORDER — VENLAFAXINE HCL ER 150 MG PO CP24
150.0000 mg | ORAL_CAPSULE | Freq: Every day | ORAL | Status: DC
  Administered 2018-11-07 – 2018-11-09 (×3): 150 mg via ORAL
  Filled 2018-11-06 (×3): qty 1

## 2018-11-06 MED ORDER — TIOTROPIUM BROMIDE MONOHYDRATE 18 MCG IN CAPS
18.0000 ug | ORAL_CAPSULE | Freq: Every day | RESPIRATORY_TRACT | Status: DC
  Administered 2018-11-07 – 2018-11-09 (×2): 18 ug via RESPIRATORY_TRACT
  Filled 2018-11-06: qty 5

## 2018-11-06 MED ORDER — LEVOFLOXACIN IN D5W 750 MG/150ML IV SOLN
750.0000 mg | INTRAVENOUS | Status: DC
  Filled 2018-11-06 (×2): qty 150

## 2018-11-06 MED ORDER — BRIMONIDINE TARTRATE-TIMOLOL 0.2-0.5 % OP SOLN
1.0000 [drp] | Freq: Two times a day (BID) | OPHTHALMIC | Status: DC
  Filled 2018-11-06: qty 5

## 2018-11-06 MED ORDER — MUPIROCIN 2 % EX OINT
TOPICAL_OINTMENT | Freq: Two times a day (BID) | CUTANEOUS | Status: DC
  Administered 2018-11-06 – 2018-11-08 (×4): via NASAL
  Filled 2018-11-06: qty 22

## 2018-11-06 MED ORDER — MUPIROCIN 2 % EX OINT
1.0000 "application " | TOPICAL_OINTMENT | Freq: Two times a day (BID) | CUTANEOUS | Status: DC
  Administered 2018-11-06 – 2018-11-09 (×6): 1 via NASAL
  Filled 2018-11-06: qty 22

## 2018-11-06 MED ORDER — FLUTICASONE PROPIONATE 50 MCG/ACT NA SUSP
1.0000 | Freq: Every day | NASAL | Status: DC
  Administered 2018-11-08 – 2018-11-09 (×2): 1 via NASAL
  Filled 2018-11-06: qty 16

## 2018-11-06 MED ORDER — CHLORHEXIDINE GLUCONATE CLOTH 2 % EX PADS
6.0000 | MEDICATED_PAD | Freq: Every day | CUTANEOUS | Status: DC
  Administered 2018-11-06 – 2018-11-08 (×3): 6 via TOPICAL

## 2018-11-06 MED ORDER — ONDANSETRON HCL 4 MG/2ML IJ SOLN: 4.0000 mg | Freq: Four times a day (QID) | INTRAMUSCULAR | Status: DC | PRN

## 2018-11-06 MED ORDER — IBUPROFEN 800 MG PO TABS
800.0000 mg | ORAL_TABLET | Freq: Once | ORAL | Status: AC
  Administered 2018-11-06: 800 mg via ORAL
  Filled 2018-11-06: qty 1

## 2018-11-06 MED ORDER — ZINC OXIDE 11.3 % EX CREA
1.0000 "application " | TOPICAL_CREAM | Freq: Two times a day (BID) | CUTANEOUS | Status: DC
  Administered 2018-11-06 – 2018-11-09 (×6): 1 via TOPICAL
  Filled 2018-11-06 (×2): qty 56

## 2018-11-06 MED ORDER — NITROGLYCERIN 0.4 MG SL SUBL: 0.4000 mg | SUBLINGUAL_TABLET | SUBLINGUAL | Status: DC | PRN

## 2018-11-06 MED ORDER — ASPIRIN EC 81 MG PO TBEC
81.0000 mg | DELAYED_RELEASE_TABLET | Freq: Every day | ORAL | Status: DC
  Administered 2018-11-06 – 2018-11-09 (×4): 81 mg via ORAL
  Filled 2018-11-06 (×4): qty 1

## 2018-11-06 MED ORDER — ACETAMINOPHEN 325 MG PO TABS
650.0000 mg | ORAL_TABLET | Freq: Four times a day (QID) | ORAL | Status: DC | PRN
  Administered 2018-11-06 – 2018-11-08 (×8): 650 mg via ORAL
  Filled 2018-11-06 (×8): qty 2

## 2018-11-06 MED ORDER — FAMOTIDINE 20 MG PO TABS
20.0000 mg | ORAL_TABLET | Freq: Every day | ORAL | Status: DC
  Administered 2018-11-06 – 2018-11-09 (×4): 20 mg via ORAL
  Filled 2018-11-06 (×4): qty 1

## 2018-11-06 MED ORDER — INSULIN ASPART 100 UNIT/ML ~~LOC~~ SOLN
0.0000 [IU] | Freq: Three times a day (TID) | SUBCUTANEOUS | Status: DC
  Administered 2018-11-06: 1 [IU] via SUBCUTANEOUS
  Administered 2018-11-07: 2 [IU] via SUBCUTANEOUS
  Administered 2018-11-07 – 2018-11-08 (×2): 1 [IU] via SUBCUTANEOUS
  Filled 2018-11-06 (×4): qty 1

## 2018-11-06 MED ORDER — MOMETASONE FURO-FORMOTEROL FUM 200-5 MCG/ACT IN AERO
2.0000 | INHALATION_SPRAY | Freq: Two times a day (BID) | RESPIRATORY_TRACT | Status: DC
  Administered 2018-11-06 – 2018-11-09 (×6): 2 via RESPIRATORY_TRACT
  Filled 2018-11-06: qty 8.8

## 2018-11-06 MED ORDER — ATORVASTATIN CALCIUM 20 MG PO TABS
80.0000 mg | ORAL_TABLET | Freq: Every day | ORAL | Status: DC
  Administered 2018-11-06 – 2018-11-08 (×3): 80 mg via ORAL
  Filled 2018-11-06 (×3): qty 4

## 2018-11-06 MED ORDER — MAGNESIUM OXIDE 400 (241.3 MG) MG PO TABS
600.0000 mg | ORAL_TABLET | Freq: Every day | ORAL | Status: DC
  Administered 2018-11-07 – 2018-11-09 (×3): 600 mg via ORAL
  Filled 2018-11-06 (×3): qty 2

## 2018-11-06 MED ORDER — SPIRONOLACTONE 25 MG PO TABS
25.0000 mg | ORAL_TABLET | Freq: Every day | ORAL | Status: DC
  Administered 2018-11-06 – 2018-11-08 (×3): 25 mg via ORAL
  Filled 2018-11-06 (×3): qty 1

## 2018-11-06 MED ORDER — DICLOFENAC SODIUM 1 % TD GEL
2.0000 g | Freq: Two times a day (BID) | TRANSDERMAL | Status: DC
  Administered 2018-11-06 – 2018-11-07 (×3): 2 g via TOPICAL
  Administered 2018-11-08 (×2): 4 g via TOPICAL
  Administered 2018-11-09: 2 g via TOPICAL
  Filled 2018-11-06: qty 100

## 2018-11-06 MED ORDER — LEVOTHYROXINE SODIUM 50 MCG PO TABS
50.0000 ug | ORAL_TABLET | Freq: Every day | ORAL | Status: DC
  Administered 2018-11-07 – 2018-11-09 (×3): 50 ug via ORAL
  Filled 2018-11-06 (×3): qty 1

## 2018-11-06 MED ORDER — INSULIN ASPART 100 UNIT/ML ~~LOC~~ SOLN: 0.0000 [IU] | Freq: Every day | SUBCUTANEOUS | Status: DC

## 2018-11-06 MED ORDER — PANTOPRAZOLE SODIUM 40 MG PO TBEC
40.0000 mg | DELAYED_RELEASE_TABLET | Freq: Every day | ORAL | Status: DC
  Administered 2018-11-06 – 2018-11-09 (×4): 40 mg via ORAL
  Filled 2018-11-06 (×4): qty 1

## 2018-11-06 MED ORDER — ALBUTEROL SULFATE (2.5 MG/3ML) 0.083% IN NEBU: 2.5000 mg | INHALATION_SOLUTION | Freq: Four times a day (QID) | RESPIRATORY_TRACT | Status: DC | PRN

## 2018-11-06 MED ORDER — BRIMONIDINE TARTRATE 0.2 % OP SOLN
1.0000 [drp] | Freq: Two times a day (BID) | OPHTHALMIC | Status: DC
  Administered 2018-11-06 – 2018-11-09 (×6): 1 [drp] via OPHTHALMIC
  Filled 2018-11-06: qty 5

## 2018-11-06 MED ORDER — GABAPENTIN 300 MG PO CAPS
600.0000 mg | ORAL_CAPSULE | Freq: Every day | ORAL | Status: DC
  Administered 2018-11-06 – 2018-11-08 (×3): 600 mg via ORAL
  Filled 2018-11-06 (×3): qty 2

## 2018-11-06 MED ORDER — ARIPIPRAZOLE 5 MG PO TABS
5.0000 mg | ORAL_TABLET | Freq: Every day | ORAL | Status: DC
  Administered 2018-11-06 – 2018-11-09 (×4): 5 mg via ORAL
  Filled 2018-11-06 (×4): qty 1

## 2018-11-06 MED ORDER — INSULIN ASPART 100 UNIT/ML ~~LOC~~ SOLN
12.0000 [IU] | Freq: Every day | SUBCUTANEOUS | Status: DC
  Administered 2018-11-08: 12 [IU] via SUBCUTANEOUS
  Filled 2018-11-06: qty 1

## 2018-11-06 NOTE — ED Notes (Signed)
Pt arrives into room at this time with this RN.

## 2018-11-06 NOTE — TOC Initial Note (Signed)
Transition of Care [facility]) - Initial/Assessment Note    Patient Details  Name: Mario Blackburn MRN: [id number] Date of Birth: November 17, 1945  Transition of Care [facility]) CM/SW Contact:    Latanya Maudlin, RN Phone Number: 11/06/2018, 8:07 AM  Clinical Narrative:   Patient was discharged on 3/19 back home with his spouse. TOC received consult to assess for needs. Patient was sent home with home health via [facility] and was ordered a hospital bed and wheelchair via [employer]. Brad from [employer] checking on status of order since the ED notes states the equipment was not delivered. RNCM will follow to ensure equipment is in place at discharge. Patient was discharged with home health via [employer]. Notified Cheryl of admission. Patient is somewhat oriented but quite agitated during our conversation. Per MD patients spouse thought home health would entail more support and she feels she may not be completely capable of providing the needed care. MD has ordered PT consult to see if SNF would be more appropriate. Will follow for PT consult recommendations to assist with transition of care appropriately.                 Barriers to Discharge: Continued Medical Work up   Patient Goals and CMS Choice   CMS Medicare.gov Compare Post Acute Care list provided to:: Patient    Expected Discharge Plan and Services     Discharge Planning Services: CM Consult   Living arrangements for the past 2 months: Single Family Home                          Prior Living Arrangements/Services Living arrangements for the past 2 months: Single Family Home Lives with:: Spouse Patient language and need for interpreter reviewed:: No        Need for Family Participation in Patient Care: No (Comment) Care giver support system in place?: No (comment) Current home services: Home RN, Home PT, Homehealth aide Criminal Activity/Legal Involvement Pertinent to Current Situation/Hospitalization: No - Comment as needed  Activities of Daily  Living Home Assistive Devices/Equipment: Wheelchair ADL Screening (condition at time of admission) Patient's cognitive ability adequate to safely complete daily activities?: Yes Is the patient deaf or have difficulty hearing?: No Does the patient have difficulty seeing, even when wearing glasses/contacts?: No Does the patient have difficulty concentrating, remembering, or making decisions?: No Patient able to express need for assistance with ADLs?: Yes Does the patient have difficulty dressing or bathing?: Yes Independently performs ADLs?: No Communication: Independent Dressing (OT): Needs assistance Is this a change from baseline?: Pre-admission baseline Grooming: Needs assistance Is this a change from baseline?: Pre-admission baseline Feeding: Needs assistance Is this a change from baseline?: Pre-admission baseline Bathing: Needs assistance Is this a change from baseline?: Pre-admission baseline Toileting: Needs assistance Is this a change from baseline?: Pre-admission baseline In/Out Bed: Needs assistance Is this a change from baseline?: Pre-admission baseline Walks in Home: Needs assistance Is this a change from baseline?: Pre-admission baseline Does the patient have difficulty walking or climbing stairs?: Yes Weakness of Legs: Both Weakness of Arms/Hands: None  Permission Sought/Granted Permission sought to share information with : Case Manager, Other (comment)(social work)                Emotional Assessment Appearance:: Disheveled Attitude/Demeanor/Rapport: Inconsistent Affect (typically observed): Irritable Orientation: : Oriented to Self, Oriented to Place      Admission diagnosis:  Wound infection [T14.8XXA, L08.9] Cellulitis of right leg [L03.115] Chronic venous stasis [I87.8]  Patient Active Problem List   Diagnosis Date Noted  . Cellulitis 11/02/2018  . Chronic venous insufficiency 10/20/2018  . Lymphedema 10/20/2018  . Bilateral lower extremity edema  09/29/2018  . Pressure injury of skin 09/15/2018  . Diabetes ([place]) 09/12/2018  . HTN (hypertension) 09/12/2018  . HLD (hyperlipidemia) 09/12/2018  . Chronic diastolic CHF (congestive heart failure) ([place]) 09/12/2018  . COPD (chronic obstructive pulmonary disease) ([place]) 09/12/2018  . Hypoxia 08/20/2015  . Chest pain 04/27/2015   PCP:  Danae Orleans, MD Pharmacy:   [place], [place] - [address] [place] [address] Phone: [phone] Fax: [phone]  [place] [address], [place] - [place] [address] Phone: [phone] Fax: [phone]  [facility] [place], [place] - [address] AT [place] [facility] [place] [address] Phone: [phone] Fax: [phone]     Social Determinants of Health (SDOH) Interventions    Readmission Risk Interventions Readmission Risk Prevention Plan 11/04/2018  Transportation Screening Complete  PCP or Specialist Appt within 3-5 Days Complete  HRI or Home Care Consult Complete  Palliative Care Screening Patient Refused  Medication Review (RN Care Manager) Complete  Some recent data might be hidden

## 2018-11-06 NOTE — H&P (Signed)
Mario Blackburn is an 73 y.o. male.   Chief Complaint: Leg wounds HPI: The patient with past medical history of CHF, diabetes, hypertension and stroke presents to the emergency department complaining of weeping leg wounds and painful swelling.  The patient was discharged earlier today but did not receive durable medical equipment that was supposed to be delivered by home health.  His wife states that she cannot help the patient ambulate safely and the patient complains of pain and purulent drainage that he cannot address on his own.  Due to the inability to care for himself at home the emergency department staff called the hospitalist service for further management.  Past Medical History:  Diagnosis Date  . Arthritis   . Asthma   . Cancer ([place])   . CHF (congestive heart failure) ([place])   . Diabetes mellitus without complication ([place])   . Gout   . Hyperlipemia   . Hypertension   . Osteoarthritis   . Stroke [facility]     Past Surgical History:  Procedure Laterality Date  . CHOLECYSTECTOMY      Family History  Family history unknown: Yes   Social History:  reports that he has quit smoking. He has never used smokeless tobacco. He reports that he does not drink alcohol or use drugs.  Allergies:  Allergies  Allergen Reactions  . Morphine And Related Other (See Comments)    Pt states that this medication put him in a coma.  . Percocet [Oxycodone-Acetaminophen] Other (See Comments)    Pt states that this medication put him in a coma.  . Benadryl [Diphenhydramine Hcl] Hives  . Keflex [Cephalexin] Hives  . Penicillins Hives and Other (See Comments)    Has patient had a PCN reaction causing immediate rash, facial/tongue/throat swelling, SOB or lightheadedness with hypotension: No Has patient had a PCN reaction causing severe rash involving mucus membranes or skin necrosis: No Has patient had a PCN reaction that required hospitalization No Has patient had a PCN reaction occurring within the  last 10 years: No If all of the above answers are "NO", then may proceed with Cephalosporin use.  . Bactrim [Sulfamethoxazole-Trimethoprim] Other (See Comments)    Reaction:  Rash- Care Everywhere (chart review) 06/2018 note    Medications Prior to Admission  Medication Sig Dispense Refill  . acetaminophen (TYLENOL) 650 MG CR tablet Take 650 mg by mouth 2 (two) times daily.     Marland Kitchen albuterol (PROVENTIL) (2.5 MG/3ML) 0.083% nebulizer solution Take 2.5 mg by nebulization 4 (four) times daily as needed for wheezing or shortness of breath.    Marland Kitchen amLODipine (NORVASC) 10 MG tablet Take 1 tablet (10 mg total) by mouth at bedtime. 30 tablet 0  . ARIPiprazole (ABILIFY) 5 MG tablet Take 5 mg by mouth daily.     Marland Kitchen aspirin EC 81 MG tablet Take 81 mg by mouth daily.    Marland Kitchen atorvastatin (LIPITOR) 80 MG tablet Take 80 mg by mouth at bedtime.    . Benzocaine-Menthol 15-3.6 MG LOZG Take 1 lozenge by mouth as needed.    . bimatoprost (LUMIGAN) 0.01 % SOLN Place 1 drop into both eyes at bedtime.    . brimonidine-timolol (COMBIGAN) 0.2-0.5 % ophthalmic solution Place 1 drop into both eyes 2 (two) times daily.    . clindamycin (CLEOCIN) 150 MG capsule Take 3 capsules (450 mg total) by mouth every 8 (eight) hours for 7 days. 63 capsule 0  . diclofenac sodium (VOLTAREN) 1 % GEL Apply 2-4 g topically 2 (two)  times daily. Apply 2g to right shoulder and 4g to right knee    . divalproex (DEPAKOTE ER) 500 MG 24 hr tablet Take 1,000 mg by mouth 2 (two) times daily.    . fluticasone (FLONASE) 50 MCG/ACT nasal spray Place 1 spray into both nostrils daily.    . Fluticasone-Salmeterol (ADVAIR) 250-50 MCG/DOSE AEPB Inhale 1 puff into the lungs 2 (two) times daily.    . furosemide (LASIX) 40 MG tablet Take 40 mg by mouth daily.     Marland Kitchen gabapentin (NEURONTIN) 300 MG capsule Take 600 mg by mouth at bedtime.     . insulin aspart (NOVOLOG) 100 UNIT/ML injection Inject 12-14 Units into the skin 2 (two) times daily with a meal. 12 units  with breakfast and 14 units with dinner    . insulin glargine (LANTUS) 100 UNIT/ML injection Inject 10 Units into the skin at bedtime.     Marland Kitchen levothyroxine (SYNTHROID, LEVOTHROID) 50 MCG tablet Take 50 mcg by mouth daily before breakfast.    . lisinopril (PRINIVIL,ZESTRIL) 40 MG tablet Take 40 mg by mouth 2 (two) times daily.     Marland Kitchen loperamide (IMODIUM A-D) 2 MG tablet Take 2 mg by mouth every hour as needed for diarrhea or loose stools.    . magnesium oxide (MAG-OX) 400 MG tablet Take 600 mg by mouth daily.     . metoprolol succinate (TOPROL-XL) 25 MG 24 hr tablet Take 25 mg by mouth daily.    . mupirocin ointment (BACTROBAN) 2 % Place 1 application into the nose 2 (two) times daily. 22 g 0  . nitroGLYCERIN (NITROSTAT) 0.4 MG SL tablet Place 0.4 mg under the tongue every 5 (five) minutes as needed for chest pain.    Marland Kitchen nystatin-triamcinolone (MYCOLOG II) cream Apply 1 application topically 2 (two) times daily.    . pantoprazole (PROTONIX) 40 MG tablet Take 1 tablet (40 mg total) by mouth daily. 30 tablet 0  . ranitidine (ZANTAC) 150 MG tablet Take 150 mg by mouth 2 (two) times daily.    Marland Kitchen spironolactone (ALDACTONE) 25 MG tablet Take 25 mg by mouth daily.    Marland Kitchen tiotropium (SPIRIVA) 18 MCG inhalation capsule Place 1 capsule (18 mcg total) into inhaler and inhale daily. 30 capsule 12  . traMADol (ULTRAM) 50 MG tablet Take 50 mg by mouth every 6 (six) hours as needed for pain.    Marland Kitchen venlafaxine XR (EFFEXOR-XR) 150 MG 24 hr capsule Take 150 mg by mouth daily with breakfast. Take with 75mg  capsule for 225mg     . venlafaxine XR (EFFEXOR-XR) 75 MG 24 hr capsule Take 75 mg by mouth daily. Take with 150mg  capsule for 225mg     . Vitamins A & D (VITAMIN A & D) ointment Apply 1 application topically 3 (three) times daily.    . Zinc Oxide (DESITIN) 13 % CREA Apply 1 application topically 2 (two) times daily. Apply to scrotum      Results for orders placed or performed during the hospital encounter of 11/05/18  (from the past 48 hour(s))  Basic metabolic panel     Status: Abnormal   Collection Time: 11/05/18  9:48 PM  Result Value Ref Range   Sodium 139 135 - 145 mmol/L   Potassium 4.5 3.5 - 5.1 mmol/L   Chloride 100 98 - 111 mmol/L   CO2 29 22 - 32 mmol/L   Glucose, Bld 171 (H) 70 - 99 mg/dL   BUN 21 8 - 23 mg/dL   Creatinine, Ser 0.87  0.61 - 1.24 mg/dL   Calcium 9.0 8.9 - 10.3 mg/dL   GFR calc non Af Amer >60 >60 mL/min   GFR calc Af Amer >60 >60 mL/min   Anion gap 10 5 - 15    Comment: Performed at [facility], [address] [place], [place] [address]  CBC     Status: Abnormal   Collection Time: 11/05/18  9:48 PM  Result Value Ref Range   WBC 10.4 4.0 - 10.5 K/uL   RBC 4.69 4.22 - 5.81 MIL/uL   Hemoglobin 13.3 13.0 - 17.0 g/dL   HCT 41.7 39.0 - 52.0 %   MCV 88.9 80.0 - 100.0 fL   MCH 28.4 26.0 - 34.0 pg   MCHC 31.9 30.0 - 36.0 g/dL   RDW 15.7 (H) 11.5 - 15.5 %   Platelets 299 150 - 400 K/uL   nRBC 0.0 0.0 - 0.2 %    Comment: Performed at [facility], [address] [place],  [address]   No results found.  Review of Systems  Constitutional: Negative for chills and fever.  HENT: Negative for sore throat and tinnitus.   Eyes: Negative for blurred vision and redness.  Respiratory: Negative for cough and shortness of breath.   Cardiovascular: Negative for chest pain, palpitations, orthopnea and PND.  Gastrointestinal: Negative for abdominal pain, diarrhea, nausea and vomiting.  Genitourinary: Negative for dysuria, frequency and urgency.  Musculoskeletal: Negative for joint pain and myalgias.  Skin: Negative for rash.       No lesions  Neurological: Negative for speech change, focal weakness and weakness.  Endo/Heme/Allergies: Does not bruise/bleed easily.       No temperature intolerance  Psychiatric/Behavioral: Negative for depression and suicidal ideas.    Blood pressure 129/75, pulse 75, temperature 97.7 F (36.5 C), temperature source  Oral, resp. rate 19, height 5\' 9"  (1.753 m), weight (!) 137.3 kg, SpO2 100 %. Physical Exam  Vitals reviewed. Constitutional: He is oriented to person, place, and time. He appears well-developed and well-nourished. No distress.  HENT:  Head: Normocephalic and atraumatic.  Mouth/Throat: Oropharynx is clear and moist.  Eyes: Pupils are equal, round, and reactive to light. Conjunctivae and EOM are normal. No scleral icterus.  Neck: Normal range of motion. Neck supple. No JVD present. No tracheal deviation present. No thyromegaly present.  Cardiovascular: Normal rate, regular rhythm and normal heart sounds. Exam reveals no gallop and no friction rub.  No murmur heard. Respiratory: Effort normal and breath sounds normal. No respiratory distress.  GI: Soft. Bowel sounds are normal. He exhibits no distension. There is no abdominal tenderness.  Genitourinary:    Genitourinary Comments: Deferred   Musculoskeletal: Normal range of motion.        General: Edema present.  Lymphadenopathy:    He has no cervical adenopathy.  Neurological: He is alert and oriented to person, place, and time. No cranial nerve deficit.  Skin: Skin is warm and dry. No rash noted. There is erythema.  Wounds with purulent drainage  Psychiatric: He has a normal mood and affect. His behavior is normal. Judgment and thought content normal.     Assessment/Plan This is a 73 year old male admitted for cellulitis. 1.  Cellulitis: Swelling and purulent drainage of right lower extremity.  Patient does not meet criteria for sepsis, however dressing changes due to unchanged drainage and purulence are difficult to manage at home.  I have started the patient on IV clindamycin.  He may benefit from a longer course  of IV antibiotics.  Consider PICC line placement 2.  Diabetes mellitus type 2: Continue basal insulin and sliding scale. 3.  Hypertension: Controlled at this time; continue antihypertensive medications once verified by  pharmacy 4.  Hypothyroidism: Check TSH; continue Synthroid 5.  DVT prophylaxis: Lovenox 6.  GI prophylaxis: None The patient is a DNR.  Time spent on admission orders and patient care approximately 45 minutes  Harrie Foreman, MD 11/06/2018, 6:13 AM

## 2018-11-06 NOTE — ED Notes (Signed)
ED TO INPATIENT HANDOFF REPORT  ED Nurse Name and Phone #: Gwynn Burly Name/Age/Gender [facility] 73 y.o. male Room/Bed: ED18A/ED18A  Code Status   Code Status: Prior  Home/SNF/Other Home Patient oriented to: self, place, time and situation Is this baseline? Yes   Triage Complete: Triage complete  Chief Complaint Foot Infection  Triage Note Pt arrives with c/o right leg swelling and drainage. Pt says that he just does not feel any better since being discharged yesterday from the hospital.    Allergies Allergies  Allergen Reactions  . Morphine And Related Other (See Comments)    Pt states that this medication put him in a coma.  . Percocet [Oxycodone-Acetaminophen] Other (See Comments)    Pt states that this medication put him in a coma.  . Benadryl [Diphenhydramine Hcl] Hives  . Keflex [Cephalexin] Hives  . Penicillins Hives and Other (See Comments)    Has patient had a PCN reaction causing immediate rash, facial/tongue/throat swelling, SOB or lightheadedness with hypotension: No Has patient had a PCN reaction causing severe rash involving mucus membranes or skin necrosis: No Has patient had a PCN reaction that required hospitalization No Has patient had a PCN reaction occurring within the last 10 years: No If all of the above answers are "NO", then may proceed with Cephalosporin use.  . Bactrim [Sulfamethoxazole-Trimethoprim] Other (See Comments)    Reaction:  Rash- Care Everywhere (chart review) 06/2018 note    Level of Care/Admitting Diagnosis ED Disposition    ED Disposition Condition [facility]: [place] [100120]  Level of Care: Med-Surg [16]  Diagnosis: Cellulitis [161096]  Admitting Physician: Harrie Foreman [[id number]]  Attending Physician: Harrie Foreman [[id number]]  Estimated length of stay: past midnight tomorrow  Certification:: I certify this patient will need inpatient services for at least 2  midnights  PT Class (Do Not Modify): Inpatient [101]  PT Acc Code (Do Not Modify): Private [1]       B Medical/Surgery History Past Medical History:  Diagnosis Date  . Arthritis   . Asthma   . Cancer ([place])   . CHF (congestive heart failure) ([place])   . Diabetes mellitus without complication ([place])   . Gout   . Hyperlipemia   . Hypertension   . Osteoarthritis   . Stroke [facility])    Past Surgical History:  Procedure Laterality Date  . CHOLECYSTECTOMY       A IV Location/Drains/Wounds Patient Lines/Drains/Airways Status   Active Line/Drains/Airways    Name:   Placement date:   Placement time:   Site:   Days:   Peripheral IV 11/06/18 Left Antecubital   11/06/18    0351    Antecubital   less than 1   External Urinary Catheter   09/13/18    0015    -   54   Pressure Injury 09/13/18 Stage I -  Intact skin with non-blanchable redness of a localized area usually over a bony prominence.   09/13/18    0015     54   Wound / Incision (Open or Dehisced) 09/14/18 Other (Comment) Foot Right   09/14/18    0800    Foot   53   Wound / Incision (Open or Dehisced) 11/02/18   11/02/18    1927    -   4          Intake/Output Last 24 hours No intake or output data in the 24 hours ending 11/06/18  0352  Labs/Imaging Results for orders placed or performed during the hospital encounter of 11/05/18 (from the past 48 hour(s))  Basic metabolic panel     Status: Abnormal   Collection Time: 11/05/18  9:48 PM  Result Value Ref Range   Sodium 139 135 - 145 mmol/L   Potassium 4.5 3.5 - 5.1 mmol/L   Chloride 100 98 - 111 mmol/L   CO2 29 22 - 32 mmol/L   Glucose, Bld 171 (H) 70 - 99 mg/dL   BUN 21 8 - 23 mg/dL   Creatinine, Ser 0.87 0.61 - 1.24 mg/dL   Calcium 9.0 8.9 - 10.3 mg/dL   GFR calc non Af Amer >60 >60 mL/min   GFR calc Af Amer >60 >60 mL/min   Anion gap 10 5 - 15    Comment: Performed at [facility], [address] [place], [place] [address]  CBC     Status: Abnormal    Collection Time: 11/05/18  9:48 PM  Result Value Ref Range   WBC 10.4 4.0 - 10.5 K/uL   RBC 4.69 4.22 - 5.81 MIL/uL   Hemoglobin 13.3 13.0 - 17.0 g/dL   HCT 41.7 39.0 - 52.0 %   MCV 88.9 80.0 - 100.0 fL   MCH 28.4 26.0 - 34.0 pg   MCHC 31.9 30.0 - 36.0 g/dL   RDW 15.7 (H) 11.5 - 15.5 %   Platelets 299 150 - 400 K/uL   nRBC 0.0 0.0 - 0.2 %    Comment: Performed at [facility], [address], [place], [place] [address]   No results found.  Pending Labs Unresulted Labs (From admission, onward)    Start     Ordered   11/06/18 0327  Blood culture (routine x 2)  BLOOD CULTURE X 2,   STAT     11/06/18 0327   Signed and Held  Creatinine, serum  (enoxaparin (LOVENOX)    CrCl >/= 30 ml/min)  Weekly,   R    Comments:  while on enoxaparin therapy    Signed and Held   Signed and Held  TSH  Add-on,   R     Signed and Held          Vitals/Pain Today's Vitals   11/05/18 2131 11/05/18 2132 11/06/18 0015 11/06/18 0147  BP: (!) 168/71   (!) 148/64  Pulse: 75   75  Resp: 20   20  Temp: 97.9 F (36.6 C)     TempSrc: Oral     SpO2: 99%   97%  Weight:  (!) 139 kg    Height:  5\' 9"  (1.753 m)    PainSc:  5  6      Isolation Precautions No active isolations  Medications Medications  clindamycin (CLEOCIN) IVPB 600 mg (600 mg Intravenous New Bag/Given 11/06/18 0352)  ibuprofen (ADVIL,MOTRIN) tablet 800 mg (800 mg Oral Given 11/06/18 0014)    Mobility power wheelchair High fall risk   Focused Assessments none   R Recommendations: See Admitting Provider Note  Report given to:   Additional Notes

## 2018-11-06 NOTE — ED Provider Notes (Signed)
[facility] Emergency Department Provider Note  ____________________________________________   First MD Initiated Contact with Patient 11/06/18 0150     (approximate)  I have reviewed the triage vital signs and the nursing notes.   HISTORY  Chief Complaint No chief complaint on file.    HPI Mario Blackburn is a 73 y.o. male with extensive chronic medical issues including chronic wound infections particularly in the right lower extremity who presents by EMS for evaluation of worsening swelling, pain, infection, and foul odor in his right leg and foot.  He was just discharged from the hospital less than 48 hours ago for cellulitis after evaluation by vascular surgery and wound care.  He is supposed to be getting home health but no one showed up yesterday.  His bandages have been in place since his hospitalization and his leg is starting to stink.  His wife is concerned about the amount of discharge that seems to be present on the bandages and comments repeatedly on how bad the leg smell and how they are "rotten" and infected.  The patient feels generalized weakness and is unable to care for himself and is wheelchair-bound at baseline.  He denies fever/chills, chest pain, shortness of breath, nausea, vomiting, and abdominal pain.  His symptoms are severe and gradual in onset but seem to have gotten worse since he left the hospital.  He was transitioned from IV vancomycin to clindamycin 450 mg by mouth 3 times a day at home and he says he has been taking his medications.         Past Medical History:  Diagnosis Date   Arthritis    Asthma    Cancer ([place])    CHF (congestive heart failure) ([place])    Diabetes mellitus without complication ([place])    Gout    Hyperlipemia    Hypertension    Osteoarthritis    Stroke [facility])     Patient Active Problem List   Diagnosis Date Noted   Cellulitis 11/02/2018   Chronic venous insufficiency 10/20/2018    Lymphedema 10/20/2018   Bilateral lower extremity edema 09/29/2018   Pressure injury of skin 09/15/2018   Diabetes ([place]) 09/12/2018   HTN (hypertension) 09/12/2018   HLD (hyperlipidemia) 09/12/2018   Chronic diastolic CHF (congestive heart failure) ([place]) 09/12/2018   COPD (chronic obstructive pulmonary disease) ([place]) 09/12/2018   Hypoxia 08/20/2015   Chest pain 04/27/2015    Past Surgical History:  Procedure Laterality Date   CHOLECYSTECTOMY      Prior to Admission medications   Medication Sig Start Date End Date Taking? Authorizing Provider  acetaminophen (TYLENOL) 650 MG CR tablet Take 650 mg by mouth 2 (two) times daily.     [provider]  albuterol (PROVENTIL) (2.5 MG/3ML) 0.083% nebulizer solution Take 2.5 mg by nebulization 4 (four) times daily as needed for wheezing or shortness of breath.    [provider]  amLODipine (NORVASC) 10 MG tablet Take 1 tablet (10 mg total) by mouth at bedtime. 09/16/18   Epifanio Lesches, MD  ARIPiprazole (ABILIFY) 5 MG tablet Take 5 mg by mouth daily.     [provider]  aspirin EC 81 MG tablet Take 81 mg by mouth daily.    [provider]  atorvastatin (LIPITOR) 80 MG tablet Take 80 mg by mouth at bedtime.    [provider]  Benzocaine-Menthol 15-3.6 MG LOZG Take 1 lozenge by mouth as needed.    [provider]  bimatoprost (LUMIGAN) 0.01 %  SOLN Place 1 drop into both eyes at bedtime.    [provider]  brimonidine-timolol (COMBIGAN) 0.2-0.5 % ophthalmic solution Place 1 drop into both eyes 2 (two) times daily.    [provider]  clindamycin (CLEOCIN) 150 MG capsule Take 3 capsules (450 mg total) by mouth every 8 (eight) hours for 7 days. 11/04/18 11/11/18  Bettey Costa, MD  diclofenac sodium (VOLTAREN) 1 % GEL Apply 2-4 g topically 2 (two) times daily. Apply 2g to right shoulder and 4g to right knee    [provider]  divalproex (DEPAKOTE ER) 500 MG  24 hr tablet Take 1,000 mg by mouth 2 (two) times daily.    [provider]  fluticasone (FLONASE) 50 MCG/ACT nasal spray Place 1 spray into both nostrils daily.    [provider]  Fluticasone-Salmeterol (ADVAIR) 250-50 MCG/DOSE AEPB Inhale 1 puff into the lungs 2 (two) times daily.    [provider]  furosemide (LASIX) 40 MG tablet Take 40 mg by mouth daily.     [provider]  gabapentin (NEURONTIN) 300 MG capsule Take 600 mg by mouth at bedtime.     [provider]  insulin aspart (NOVOLOG) 100 UNIT/ML injection Inject 12-14 Units into the skin 2 (two) times daily with a meal. 12 units with breakfast and 14 units with dinner    [provider]  insulin glargine (LANTUS) 100 UNIT/ML injection Inject 10 Units into the skin at bedtime.     [provider]  ipratropium (ATROVENT) 0.03 % nasal spray Place 2 sprays into the nose 3 (three) times daily. 12/06/16 12/06/17  Darel Hong, MD  levothyroxine (SYNTHROID, LEVOTHROID) 50 MCG tablet Take 50 mcg by mouth daily before breakfast.    [provider]  lisinopril (PRINIVIL,ZESTRIL) 40 MG tablet Take 40 mg by mouth 2 (two) times daily.     [provider]  loperamide (IMODIUM A-D) 2 MG tablet Take 2 mg by mouth every hour as needed for diarrhea or loose stools.    [provider]  magnesium oxide (MAG-OX) 400 MG tablet Take 600 mg by mouth daily.     [provider]  metoprolol succinate (TOPROL-XL) 25 MG 24 hr tablet Take 25 mg by mouth daily.    [provider]  mupirocin ointment (BACTROBAN) 2 % Place 1 application into the nose 2 (two) times daily. 09/16/18   Epifanio Lesches, MD  nitroGLYCERIN (NITROSTAT) 0.4 MG SL tablet Place 0.4 mg under the tongue every 5 (five) minutes as needed for chest pain.    [provider]  nystatin-triamcinolone (MYCOLOG II) cream Apply 1 application topically 2 (two) times daily. 03/06/15    [provider]  pantoprazole (PROTONIX) 40 MG tablet Take 1 tablet (40 mg total) by mouth daily. 09/17/18   Epifanio Lesches, MD  ranitidine (ZANTAC) 150 MG tablet Take 150 mg by mouth 2 (two) times daily.    [provider]  spironolactone (ALDACTONE) 25 MG tablet Take 25 mg by mouth daily.    [provider]  tiotropium (SPIRIVA) 18 MCG inhalation capsule Place 1 capsule (18 mcg total) into inhaler and inhale daily. 09/17/18   Epifanio Lesches, MD  traMADol (ULTRAM) 50 MG tablet Take 50 mg by mouth every 6 (six) hours as needed for pain. 03/18/18   [provider]  venlafaxine XR (EFFEXOR-XR) 150 MG 24 hr capsule Take 150 mg by mouth daily with breakfast. Take with 75mg  capsule for 225mg     [provider]  venlafaxine XR (EFFEXOR-XR) 75 MG 24 hr capsule Take 75 mg by mouth daily. Take with 150mg  capsule for 225mg     [provider]  Vitamins A & D (VITAMIN A & D) ointment Apply 1 application topically 3 (three) times daily.    [provider]  Zinc Oxide (DESITIN) 13 % CREA Apply 1 application topically 2 (two) times daily. Apply to scrotum    [provider]    Allergies Morphine and related; Percocet [oxycodone-acetaminophen]; Benadryl [diphenhydramine hcl]; Keflex [cephalexin]; Penicillins; and Bactrim [sulfamethoxazole-trimethoprim]  Family History  Family history unknown: Yes    Social History Social History   Tobacco Use   Smoking status: Former Smoker   Smokeless tobacco: Never Used  Substance Use Topics   Alcohol use: No   Drug use: No    Review of Systems Constitutional: No fever/chills Eyes: No visual changes. ENT: No sore throat. Cardiovascular: Denies chest pain. Respiratory: Denies shortness of breath. Gastrointestinal: No abdominal pain.  No nausea, no vomiting.  No diarrhea.  No constipation. Genitourinary: Negative for dysuria. Musculoskeletal: Chronic swelling with acute on  chronic infection particularly in the right leg and foot. Integumentary: Negative for rash. Neurological: Negative for headaches, focal weakness or numbness.   ____________________________________________   PHYSICAL EXAM:  VITAL SIGNS: ED Triage Vitals  Enc Vitals Group     BP 11/05/18 2131 (!) 168/71     Pulse Rate 11/05/18 2131 75     Resp 11/05/18 2131 20     Temp 11/05/18 2131 97.9 F (36.6 C)     Temp Source 11/05/18 2131 Oral     SpO2 11/05/18 2131 99 %     Weight 11/05/18 2132 (!) 139 kg (306 lb 7 oz)     Height 11/05/18 2132 1.753 m (5\' 9" )     Head Circumference --      Peak Flow --      Pain Score 11/05/18 2132 5     Pain Loc --      Pain Edu? --      Excl. in [place]? --     Constitutional: Alert and oriented.  Chronic illness, no acute distress. Eyes: Conjunctivae are normal.  Head: Atraumatic. Nose: No congestion/rhinnorhea. Mouth/Throat: Mucous membranes are moist. Neck: No stridor.  No meningeal signs.   Cardiovascular: Normal rate, regular rhythm. Good peripheral circulation. Grossly normal heart sounds. Respiratory: Normal respiratory effort.  No retractions. Lungs CTAB. Gastrointestinal: Morbid obesity.  Soft and nontender. No distention.  Musculoskeletal: Chronic bilateral venous stasis.  Left leg has what appears to be chronic venous stasis dermatitis but without any active infection.  The right leg had an old wound care bandage in place which I cut off with shears.  There was heavy purulence crusted throughout the wounds on his leg and foot and in the bandage.  There are a couple of areas that appear to be acute where the skin has desquamated particularly on the lateral aspect of his foot.  There is purulence coming from multiple open wounds on the foot and the leg with nonpitting edema that is likely chronic.  The leg and foot appear swollen and cellulitic.  There are no red lines tracing up more proximally than his right lower extremity to suggest cellulitis  spreading systemically. Neurologic:  Normal speech and language. No gross focal neurologic deficits are appreciated.  Skin:  Skin is warm, dry and intact. No rash noted.   ____________________________________________   LABS (all labs ordered are listed, but only  abnormal results are displayed)  Labs Reviewed  BASIC METABOLIC PANEL - Abnormal; Notable for the following components:      Result Value   Glucose, Bld 171 (*)    All other components within normal limits  CBC - Abnormal; Notable for the following components:   RDW 15.7 (*)    All other components within normal limits  CULTURE, BLOOD (ROUTINE X 2)  CULTURE, BLOOD (ROUTINE X 2)   ____________________________________________  EKG  None - EKG not ordered by ED physician ____________________________________________  RADIOLOGY   ED MD interpretation: No indication for imaging  Official radiology report(s): No results found.  ____________________________________________   PROCEDURES   Procedure(s) performed (including Critical Care):  Procedures   ____________________________________________   INITIAL IMPRESSION / MDM / [place] / ED COURSE  As part of my medical decision making, I reviewed the following data within the electronic MEDICAL RECORD NUMBER History obtained from family, Nursing notes reviewed and incorporated, Labs reviewed , Old chart reviewed, Discussed with admitting physician Dr. Marcille Blanco and Notes from prior ED visits         Differential diagnosis includes, but is not limited to, acute on chronic wound infection, gangrene, cellulitis, less likely osteomyelitis.  The patient just left the hospital less than 2 days ago and according to him and his wife he is worse.  Based on my conversations with the patient and his wife and they are limited understanding of the infection he has, the care he is to receive, the bandage changing, and even the schedule for home health, I I am afraid they  have a very limited capacity to care for him and all of his chronic illnesses as well as the acute infection.  I see no record that he received PT/OT consults and I think he would benefit from rehab and/or skilled nursing placement.  The patient has no signs of sepsis and no respiratory symptoms at this time but his wound infection does seem to be worse likely due to it being ignored for a couple of days since leaving the hospital.  I will consult with the hospitalist by phone about the situation but anticipate the patient will need to come into the hospital for several additional days of IV antibiotics and probable consults with physical therapy and Occupational Therapy and placement services.  The patient is in agreement with this plan.  I will order clindamycin 600 mg IV for broad-spectrum coverage that is stronger than what he was receiving previously.  Lab work is reassuring with a normal metabolic panel and CBC and no leukocytosis.   Clinical Course as of Nov 06 334  Sat Nov 06, 2018  0328 Discussed case with Dr. Marcille Blanco who will admit for worsening lower extremity cellulitis with purulence and wound infection and need for IV antibiotics.  The patient will benefit from PT OT consult and likely placement in a rehab or skilled nursing facility.   [CF]    Clinical Course User Index [CF] Hinda Kehr, MD    ____________________________________________  FINAL CLINICAL IMPRESSION(S) / ED DIAGNOSES  Final diagnoses:  Cellulitis of right leg  Chronic venous stasis  Wound infection     MEDICATIONS GIVEN DURING THIS VISIT:  Medications  clindamycin (CLEOCIN) IVPB 600 mg (has no administration in time range)  ibuprofen (ADVIL,MOTRIN) tablet 800 mg (800 mg Oral Given 11/06/18 0014)     ED Discharge Orders    None       Note:  This document was prepared using  Dragon Armed forces training and education officer and may include unintentional dictation errors.   Hinda Kehr, MD 11/06/18 [phone]

## 2018-11-06 NOTE — Progress Notes (Signed)
PT Cancellation Note  Patient Details Name: Mario Blackburn MRN: [id number] DOB: 12-01-1945   Cancelled Treatment:    Reason Eval/Treat Not Completed: Fatigue/lethargy limiting ability to participate   Alanson Puls , PT DPT 11/06/2018, 1:30 PM

## 2018-11-06 NOTE — Progress Notes (Signed)
OT Cancellation Note  Patient Details Name: Genesis Paget MRN: [id number] DOB: 1946/06/12   Cancelled Treatment:    Reason Eval/Treat Not Completed: Fatigue/lethargy limiting ability to participate Unable to fully arouse pt due to fatigue at this time. Will continue to follow pt as available and appropriate to initiate OT POC.  Zenovia Jarred, MSOT, OTR/L Behavioral Health OT/ Acute Relief OT   Zenovia Jarred 11/06/2018, 4:24 PM

## 2018-11-06 NOTE — Progress Notes (Signed)
[facility] [place] NAME: Mario Blackburn    MR#:  [id number]  DATE OF BIRTH:  [birth year]  SUBJECTIVE:  CHIEF COMPLAINT: Lower extremity swelling and redness  Patient admitted back to the hospital following recent discharge from the hospital 2 days prior to this admission.  Admitted with worsening lower extremity cellulitis despite being discharged home on p.o. clindamycin.  No fevers.  REVIEW OF SYSTEMS:  Review of Systems  Constitutional: Negative for chills and fever.  HENT: Negative for hearing loss and tinnitus.   Eyes: Negative for blurred vision and double vision.  Respiratory: Negative for cough and hemoptysis.   Cardiovascular: Negative for chest pain and palpitations.  Gastrointestinal: Negative for heartburn, nausea and vomiting.  Genitourinary: Negative for dysuria and urgency.  Musculoskeletal: Negative for myalgias and neck pain.  Skin: Negative for itching.       Findings of chronic venous stasis changes bilaterally  Neurological: Negative for dizziness and headaches.  Psychiatric/Behavioral: Negative for depression, hallucinations and suicidal ideas.    DRUG ALLERGIES:   Allergies  Allergen Reactions  . Morphine And Related Other (See Comments)    Pt states that this medication put him in a coma.  . Percocet [Oxycodone-Acetaminophen] Other (See Comments)    Pt states that this medication put him in a coma.  . Benadryl [Diphenhydramine Hcl] Hives  . Keflex [Cephalexin] Hives  . Penicillins Hives and Other (See Comments)    Has patient had a PCN reaction causing immediate rash, facial/tongue/throat swelling, SOB or lightheadedness with hypotension: No Has patient had a PCN reaction causing severe rash involving mucus membranes or skin necrosis: No Has patient had a PCN reaction that required hospitalization No Has patient had a PCN reaction occurring within the last 10 years: No If all of the above answers are "NO",  then may proceed with Cephalosporin use.  . Bactrim [Sulfamethoxazole-Trimethoprim] Other (See Comments)    Reaction:  Rash- Care Everywhere (chart review) 06/2018 note   VITALS:  Blood pressure 128/64, pulse 79, temperature (!) 97.5 F (36.4 C), temperature source Oral, resp. rate 19, height 5\' 9"  (1.753 m), weight (!) 137.3 kg, SpO2 (!) 89 %. PHYSICAL EXAMINATION:   Physical Exam  Constitutional: He appears well-developed and well-nourished.  HENT:  Head: Normocephalic and atraumatic.  Right Ear: External ear normal.  Eyes: Pupils are equal, round, and reactive to light. Conjunctivae are normal.  Neck: Normal range of motion. Neck supple. No thyromegaly present.  Cardiovascular: Normal rate, regular rhythm and normal heart sounds.  Respiratory: Effort normal and breath sounds normal. No respiratory distress.  GI: Soft. Bowel sounds are normal. He exhibits no distension.  Musculoskeletal: Normal range of motion.        General: Edema present.  Neurological: He is alert. No cranial nerve deficit.  Skin: Skin is warm. He is not diaphoretic. There is erythema.  Findings of chronic bilateral lower extremity venous stasis  Psychiatric: He has a normal mood and affect. His behavior is normal.   LABORATORY PANEL:  Male CBC Recent Labs  Lab 11/05/18 2148  WBC 10.4  HGB 13.3  HCT 41.7  PLT 299   ------------------------------------------------------------------------------------------------------------------ Chemistries  Recent Labs  Lab 11/02/18 1235  11/05/18 2148  NA 138   < > 139  K 4.0   < > 4.5  CL 101   < > 100  CO2 27   < > 29  GLUCOSE 137*   < > 171*  BUN 18   < >  21  CREATININE 0.79   < > 0.87  CALCIUM 8.8*   < > 9.0  AST 19  --   --   ALT 13  --   --   ALKPHOS 56  --   --   BILITOT 0.5  --   --    < > = values in this interval not displayed.   RADIOLOGY:  No results found. ASSESSMENT AND PLAN:   This is a 73 year old male admitted for cellulitis. 1.     Right lower extremity cellulitis: On examination of right lower extremities this morning, no evidence of any open wounds with purulent drainage.  Patient definitely has redness and swelling and mild tenderness suggestive of worsening cellulitis despite being discharged on p.o. clindamycin recently.  Patient was given a dose of IV clindamycin on admission.  Due to potential risk for C. difficile infection and failure of oral clindamycin following recent discharge, patient continued on IV levofloxacin.  Monitor clinically.   2.  Diabetes mellitus type 2:  Resumed home insulin regimen.  Placed on sliding scale insulin coverage.  Monitor and adjust   3.  Hypertension: Controlled at this time;  Resumed home blood pressure meds.  Monitor   4.  Hypothyroidism: TSH normal at 2.574. Continue Synthroid  5.  DVT prophylaxis: Lovenox  All the records are reviewed and case discussed with Care Management/Social Worker. Management plans discussed with the patient, family and they are in agreement.  CODE STATUS: DNR  TOTAL TIME TAKING CARE OF THIS PATIENT: 37 minutes.   More than 50% of the time was spent in counseling/coordination of care: YES  POSSIBLE D/C IN 3 DAYS, DEPENDING ON CLINICAL CONDITION.   Malynn Lucy M.D on 11/06/2018 at 12:05 PM  Between 7am to 6pm - Pager - [phone]  After 6pm go to [email or web address] - Proofreader  [facility]  Office  [phone]  CC: Primary care physician; Danae Orleans, MD  Note: This dictation was prepared with Dragon dictation along with smaller phrase technology. Any transcriptional errors that result from this process are unintentional.

## 2018-11-07 LAB — BASIC METABOLIC PANEL
Anion gap: 9 (ref 5–15)
BUN: 17 mg/dL (ref 8–23)
CO2: 28 mmol/L (ref 22–32)
Calcium: 8.9 mg/dL (ref 8.9–10.3)
Chloride: 103 mmol/L (ref 98–111)
Creatinine, Ser: 0.93 mg/dL (ref 0.61–1.24)
GFR calc Af Amer: >60 mL/min (ref >60)
GFR calc non Af Amer: >60 mL/min (ref >60)
Glucose, Bld: 134 mg/dL — ABNORMAL HIGH (ref 70–99)
Potassium: 4 mmol/L (ref 3.5–5.1)
Sodium: 140 mmol/L (ref 135–145)

## 2018-11-07 LAB — GLUCOSE, CAPILLARY
Glucose-Capillary: 110 mg/dL — ABNORMAL HIGH (ref 70–99)
Glucose-Capillary: 134 mg/dL — ABNORMAL HIGH (ref 70–99)
Glucose-Capillary: 169 mg/dL — ABNORMAL HIGH (ref 70–99)
Glucose-Capillary: 81 mg/dL (ref 70–99)

## 2018-11-07 LAB — CULTURE, BLOOD (SINGLE)
Culture: NO GROWTH
Special Requests: ADEQUATE

## 2018-11-07 LAB — CBC
HCT: 36.5 % — ABNORMAL LOW (ref 39.0–52.0)
Hemoglobin: 11.9 g/dL — ABNORMAL LOW (ref 13.0–17.0)
MCH: 28.7 pg (ref 26.0–34.0)
MCHC: 32.6 g/dL (ref 30.0–36.0)
MCV: 88 fL (ref 80.0–100.0)
PLATELETS: 301 10*3/uL (ref 150–400)
RBC: 4.15 MIL/uL — ABNORMAL LOW (ref 4.22–5.81)
RDW: 15.8 % — ABNORMAL HIGH (ref 11.5–15.5)
WBC: 9.4 10*3/uL (ref 4.0–10.5)
nRBC: 0 % (ref 0.0–0.2)

## 2018-11-07 LAB — MAGNESIUM: Magnesium: 1.7 mg/dL (ref 1.7–2.4)

## 2018-11-07 NOTE — Progress Notes (Signed)
[facility] [place] NAME: Lane Eland    MR#:  [id number]  DATE OF BIRTH:  Jul 06, 1946  SUBJECTIVE:  CHIEF COMPLAINT: Lower extremity swelling and redness  Patient admitted back to the hospital following recent discharge from the hospital 2 days prior to this admission.  Admitted with worsening lower extremity cellulitis despite being discharged home on p.o. clindamycin.  No fevers. No new complaint this morning.  REVIEW OF SYSTEMS:  Review of Systems  Constitutional: Negative for chills and fever.  HENT: Negative for hearing loss and tinnitus.   Eyes: Negative for blurred vision and double vision.  Respiratory: Negative for cough and hemoptysis.   Cardiovascular: Negative for chest pain and palpitations.  Gastrointestinal: Negative for heartburn, nausea and vomiting.  Genitourinary: Negative for dysuria and urgency.  Musculoskeletal: Negative for myalgias and neck pain.  Skin: Negative for itching.       Findings of chronic venous stasis changes bilaterally  Neurological: Negative for dizziness and headaches.  Psychiatric/Behavioral: Negative for depression, hallucinations and suicidal ideas.    DRUG ALLERGIES:   Allergies  Allergen Reactions   Morphine And Related Other (See Comments)    Pt states that this medication put him in a coma.   Percocet [Oxycodone-Acetaminophen] Other (See Comments)    Pt states that this medication put him in a coma.   Benadryl [Diphenhydramine Hcl] Hives   Keflex [Cephalexin] Hives   Penicillins Hives and Other (See Comments)    Has patient had a PCN reaction causing immediate rash, facial/tongue/throat swelling, SOB or lightheadedness with hypotension: No Has patient had a PCN reaction causing severe rash involving mucus membranes or skin necrosis: No Has patient had a PCN reaction that required hospitalization No Has patient had a PCN reaction occurring within the last 10 years: No If  all of the above answers are "NO", then may proceed with Cephalosporin use.   Bactrim [Sulfamethoxazole-Trimethoprim] Other (See Comments)    Reaction:  Rash- Care Everywhere (chart review) 06/2018 note   VITALS:  Blood pressure 107/62, pulse 81, temperature 97.9 F (36.6 C), temperature source Oral, resp. rate 20, height 5\' 9"  (1.753 m), weight (!) 137 kg, SpO2 95 %. PHYSICAL EXAMINATION:   Physical Exam  Constitutional: He appears well-developed and well-nourished.  HENT:  Head: Normocephalic and atraumatic.  Right Ear: External ear normal.  Eyes: Pupils are equal, round, and reactive to light. Conjunctivae are normal.  Neck: Normal range of motion. Neck supple. No thyromegaly present.  Cardiovascular: Normal rate, regular rhythm and normal heart sounds.  Respiratory: Effort normal and breath sounds normal. No respiratory distress.  GI: Soft. Bowel sounds are normal. He exhibits no distension.  Musculoskeletal: Normal range of motion.        General: Edema present.  Neurological: He is alert. No cranial nerve deficit.  Skin: Skin is warm. He is not diaphoretic. There is erythema.  Findings of chronic bilateral lower extremity venous stasis  Psychiatric: He has a normal mood and affect. His behavior is normal.   LABORATORY PANEL:  Male CBC Recent Labs  Lab 11/07/18 0525  WBC 9.4  HGB 11.9*  HCT 36.5*  PLT 301   ------------------------------------------------------------------------------------------------------------------ Chemistries  Recent Labs  Lab 11/02/18 1235  11/07/18 0525  NA 138   < > 140  K 4.0   < > 4.0  CL 101   < > 103  CO2 27   < > 28  GLUCOSE 137*   < > 134*  BUN 18   < > 17  CREATININE 0.79   < > 0.93  CALCIUM 8.8*   < > 8.9  MG  --   --  1.7  AST 19  --   --   ALT 13  --   --   ALKPHOS 56  --   --   BILITOT 0.5  --   --    < > = values in this interval not displayed.   RADIOLOGY:  No results found. ASSESSMENT AND PLAN:   This is a  73 year old male admitted for cellulitis. 1.    Right lower extremity cellulitis: On examination of right lower extremities this morning, no evidence of any open wounds with purulent drainage.  Patient definitely has redness and swelling and mild tenderness suggestive of worsening cellulitis despite being discharged on p.o. clindamycin recently.  Patient was given a dose of IV clindamycin on admission.  Due to potential risk for C. difficile infection and failure of oral clindamycin following recent discharge, patient continued on IV levofloxacin.  Noted gradual improvement this morning. Monitor clinically.   2.  Diabetes mellitus type 2:  Resumed home insulin regimen.  Placed on sliding scale insulin coverage.  Monitor and adjust   3.  Hypertension: Controlled at this time;  Resumed home blood pressure meds.  Monitor   4.  Hypothyroidism: TSH normal at 2.574. Continue Synthroid  5.  Debility due to multiple medical problems. Physical therapy consulted.  Patient appears to have declined physical therapy today. Patient reports using a wheelchair at baseline at home.   DVT prophylaxis: Lovenox  All the records are reviewed and case discussed with Care Management/Social Worker. Management plans discussed with the patient, family and they are in agreement.  CODE STATUS: DNR  TOTAL TIME TAKING CARE OF THIS PATIENT: 35 minutes.   More than 50% of the time was spent in counseling/coordination of care: YES  POSSIBLE D/C IN 2-3 DAYS, DEPENDING ON CLINICAL CONDITION.   Alonah Lineback M.D on 11/07/2018 at 12:36 PM  Between 7am to 6pm - Pager - [phone]  After 6pm go to [email or web address] - Proofreader  [facility]  [facility]ospitalists  Office  [phone]  CC: Primary care physician; Danae Orleans, MD  Note: This dictation was prepared with Dragon dictation along with smaller phrase technology. Any transcriptional errors that result from this process are unintentional.

## 2018-11-07 NOTE — Progress Notes (Signed)
PT Cancellation Note  Patient Details Name: Mario Blackburn MRN: [id number] DOB: 08/18/1946   Cancelled Treatment:    Reason Eval/Treat Not Completed: Patient declined, no reason specified. Patient agitated upon PT entrance. Refuses to work with PT until "bed is changed and I am cleaned". Called NT for bedding, will come back at later time.   Janna Arch, PT, DPT   11/07/2018, 8:54 AM

## 2018-11-08 LAB — BASIC METABOLIC PANEL WITH GFR
Anion gap: 8 (ref 5–15)
BUN: 23 mg/dL (ref 8–23)
CO2: 31 mmol/L (ref 22–32)
Calcium: 8.9 mg/dL (ref 8.9–10.3)
Chloride: 101 mmol/L (ref 98–111)
Creatinine, Ser: 1.02 mg/dL (ref 0.61–1.24)
GFR calc Af Amer: >60 mL/min
GFR calc non Af Amer: >60 mL/min
Glucose, Bld: 126 mg/dL — ABNORMAL HIGH (ref 70–99)
Potassium: 4.4 mmol/L (ref 3.5–5.1)
Sodium: 140 mmol/L (ref 135–145)

## 2018-11-08 LAB — GLUCOSE, CAPILLARY
Glucose-Capillary: 127 mg/dL — ABNORMAL HIGH (ref 70–99)
Glucose-Capillary: 107 mg/dL — ABNORMAL HIGH (ref 70–99)
Glucose-Capillary: 109 mg/dL — ABNORMAL HIGH (ref 70–99)
Glucose-Capillary: 126 mg/dL — ABNORMAL HIGH (ref 70–99)

## 2018-11-08 LAB — MAGNESIUM: Magnesium: 1.8 mg/dL (ref 1.7–2.4)

## 2018-11-08 MED ORDER — ENOXAPARIN SODIUM 40 MG/0.4ML ~~LOC~~ SOLN
40.0000 mg | Freq: Two times a day (BID) | SUBCUTANEOUS | Status: DC
  Administered 2018-11-08 – 2018-11-09 (×2): 40 mg via SUBCUTANEOUS
  Filled 2018-11-08 (×2): qty 0.4

## 2018-11-08 MED ORDER — POLYETHYLENE GLYCOL 3350 17 G PO PACK
17.0000 g | PACK | Freq: Every day | ORAL | Status: DC
  Administered 2018-11-08: 17 g via ORAL
  Filled 2018-11-08: qty 1

## 2018-11-08 MED ORDER — HYDROCERIN EX CREA
TOPICAL_CREAM | Freq: Two times a day (BID) | CUTANEOUS | Status: DC
  Administered 2018-11-08 – 2018-11-09 (×3): via TOPICAL
  Filled 2018-11-08: qty 113

## 2018-11-08 NOTE — TOC Progression Note (Signed)
Transition of Care [facility]) - Progression Note    Patient Details  Name: Luc Shammas MRN: [id number] Date of Birth: Mar 16, 1946  Transition of Care [facility]) CM/SW [place], RN Phone Number: 11/08/2018, 12:07 PM  Clinical Narrative:    Patient is animate that he wants to go home, he says he has 4 people at home to help him, I tried to call the wife without success. The patient states that he will be getting a lift and a wheelchair and he can safely go home.  I called Brad with [employer] to check on the status of the equipment that is supposed to be delivered before patient discharges, awaiting a call back, Patient states that he has a person coming to wrap his legs when needed.  Was set up with [facility] and that will continue.  I explained to the patient that he needs to get up with PT and work with them, he stated that he would     Barriers to Discharge: Continued Medical Work up  Expected Discharge Plan and Services     Discharge Planning Services: CM Consult   Living arrangements for the past 2 months: Single Family Home                           Social Determinants of Health (SDOH) Interventions    Readmission Risk Interventions Readmission Risk Prevention Plan 11/04/2018  Transportation Screening Complete  PCP or Specialist Appt within 3-5 Days Complete  HRI or Home Care Consult Complete  Palliative Care Screening Patient Refused  Medication Review (RN Care Manager) Complete  Some recent data might be hidden

## 2018-11-08 NOTE — TOC Progression Note (Signed)
Transition of Care [facility]) - Progression Note    Patient Details  Name: Mario Blackburn MRN: [id number] Date of Birth: [birth year]  Transition of Care [facility] CM/SW [place], RN Phone Number: 11/08/2018, 12:15 PM  Clinical Narrative:     Per Leroy Sea with [employer] the DME is scheduled for delivery and I notified him that the patient will most likely be Discharged Tuesday and that he needs before, Leroy Sea stated understanding    Barriers to Discharge: Continued Medical Work up  Expected Discharge Plan and Services     Discharge Planning Services: CM Consult   Living arrangements for the past 2 months: Single Family Home                           Social Determinants of Health (SDOH) Interventions    Readmission Risk Interventions Readmission Risk Prevention Plan 11/04/2018  Transportation Screening Complete  PCP or Specialist Appt within 3-5 Days Complete  HRI or Home Care Consult Complete  Palliative Care Screening Patient Refused  Medication Review (RN Care Manager) Complete  Some recent data might be hidden

## 2018-11-08 NOTE — TOC Progression Note (Signed)
Transition of Care (TOC) - Progression Note  Patient's wife called and stated that she wants the patient to go to a SNF.  I explained that the patient  wants to go home.  I explained that legally I can't send him to a SNF if he wants to go home.  She stated that he cusses her and won't take care of himself and he will not follow directions.  She stated that she does not want him at home.  I explained again that the patient is alert and oriented and capable of making decisions and therefore I can't send him somewhere he does not want to go.  She stated that she understands.  Patient Details  Name: Rockne Dearinger MRN: [id number] Date of Birth: 18-Oct-1945  Transition of Care [facility]) CM/SW [place], RN Phone Number: 11/08/2018, 2:01 PM  Clinical Narrative:         Barriers to Discharge: Continued Medical Work up  Expected Discharge Plan and Services     Discharge Planning Services: CM Consult   Living arrangements for the past 2 months: Single Family Home                           Social Determinants of Health (SDOH) Interventions    Readmission Risk Interventions Readmission Risk Prevention Plan 11/04/2018  Transportation Screening Complete  PCP or Specialist Appt within 3-5 Days Complete  HRI or Home Care Consult Complete  Palliative Care Screening Patient Refused  Medication Review (RN Care Manager) Complete  Some recent data might be hidden

## 2018-11-08 NOTE — Progress Notes (Signed)
[place] [facility] [place] NAME: Mario Blackburn    MR#:  [id number]  DATE OF BIRTH:  Dec 19, 1945  SUBJECTIVE:  CHIEF COMPLAINT: Lower extremity swelling and redness  Patient admitted back to the hospital following recent discharge from the hospital 2 days prior to this admission.  Admitted with worsening lower extremity cellulitis despite being discharged home on p.o. clindamycin.  No fevers. Currently on IV levofloxacin and clinically improving Ordered MiraLAX for constipation.  REVIEW OF SYSTEMS:  Review of Systems  Constitutional: Negative for chills and fever.  HENT: Negative for hearing loss and tinnitus.   Eyes: Negative for blurred vision and double vision.  Respiratory: Negative for cough and hemoptysis.   Cardiovascular: Negative for chest pain and palpitations.  Gastrointestinal: Negative for heartburn, nausea and vomiting.  Genitourinary: Negative for dysuria and urgency.  Musculoskeletal: Negative for myalgias and neck pain.  Skin: Negative for itching.       Findings of chronic venous stasis changes bilaterally  Neurological: Negative for dizziness and headaches.  Psychiatric/Behavioral: Negative for depression, hallucinations and suicidal ideas.    DRUG ALLERGIES:   Allergies  Allergen Reactions  . Morphine And Related Other (See Comments)    Pt states that this medication put him in a coma.  . Percocet [Oxycodone-Acetaminophen] Other (See Comments)    Pt states that this medication put him in a coma.  . Benadryl [Diphenhydramine Hcl] Hives  . Keflex [Cephalexin] Hives  . Penicillins Hives and Other (See Comments)    Has patient had a PCN reaction causing immediate rash, facial/tongue/throat swelling, SOB or lightheadedness with hypotension: No Has patient had a PCN reaction causing severe rash involving mucus membranes or skin necrosis: No Has patient had a PCN reaction that required hospitalization No Has patient had a  PCN reaction occurring within the last 10 years: No If all of the above answers are "NO", then may proceed with Cephalosporin use.  . Bactrim [Sulfamethoxazole-Trimethoprim] Other (See Comments)    Reaction:  Rash- Care Everywhere (chart review) 06/2018 note   VITALS:  Blood pressure (!) 116/59, pulse 76, temperature 97.7 F (36.5 C), temperature source Oral, resp. rate 12, height 5\' 9"  (1.753 m), weight 135.5 kg, SpO2 95 %. PHYSICAL EXAMINATION:   Physical Exam  Constitutional: He appears well-developed and well-nourished.  HENT:  Head: Normocephalic and atraumatic.  Right Ear: External ear normal.  Eyes: Pupils are equal, round, and reactive to light. Conjunctivae are normal.  Neck: Normal range of motion. Neck supple. No thyromegaly present.  Cardiovascular: Normal rate, regular rhythm and normal heart sounds.  Respiratory: Effort normal and breath sounds normal. No respiratory distress.  GI: Soft. Bowel sounds are normal. He exhibits no distension.  Musculoskeletal: Normal range of motion.     Comments: Lower extremity edema as well as area of cellulitis on the right lower extremity improving.  Neurological: He is alert. No cranial nerve deficit.  Skin: Skin is warm. He is not diaphoretic. There is erythema.  Findings of chronic bilateral lower extremity venous stasis  Psychiatric: He has a normal mood and affect. His behavior is normal.   LABORATORY PANEL:  Male CBC Recent Labs  Lab 11/07/18 0525  WBC 9.4  HGB 11.9*  HCT 36.5*  PLT 301   ------------------------------------------------------------------------------------------------------------------ Chemistries  Recent Labs  Lab 11/02/18 1235  11/08/18 0325  NA 138   < > 140  K 4.0   < > 4.4  CL 101   < > 101  CO2 27   < > 31  GLUCOSE 137*   < > 126*  BUN 18   < > 23  CREATININE 0.79   < > 1.02  CALCIUM 8.8*   < > 8.9  MG  --    < > 1.8  AST 19  --   --   ALT 13  --   --   ALKPHOS 56  --   --   BILITOT  0.5  --   --    < > = values in this interval not displayed.   RADIOLOGY:  No results found. ASSESSMENT AND PLAN:   This is a 73 year old male admitted for cellulitis. 1.    Right lower extremity cellulitis: On examination of right lower extremities this morning, no evidence of any open wounds with purulent drainage.  Patient definitely has redness and swelling and mild tenderness suggestive of worsening cellulitis despite being discharged on p.o. clindamycin recently.  Patient was given a dose of IV clindamycin on admission.  Due to potential risk for C. difficile infection and failure of oral clindamycin following recent discharge, patient placed on IV levofloxacin on continues to improve daily Monitor clinically.   2.  Diabetes mellitus type 2:  Resumed home insulin regimen.  Placed on sliding scale insulin coverage.  Monitor and adjust   3.  Hypertension: Controlled at this time;  Resumed home blood pressure meds.  Monitor   4.  Hypothyroidism: TSH normal at 2.574. Continue Synthroid  5.  Debility due to multiple medical problems. Physical therapy consulted.  We will follow-up on evaluation and recommendations from physical therapy to determine if patient requires skilled nursing facility placement.  Patient reports using a wheelchair at baseline at home.  Case manager already working on patient having equipment including wheelchair delivered to the house prior to discharge.   DVT prophylaxis: Lovenox  All the records are reviewed and case discussed with Care Management/Social Worker. Management plans discussed with the patient, family and they are in agreement.  CODE STATUS: DNR  TOTAL TIME TAKING CARE OF THIS PATIENT: 34 minutes.   More than 50% of the time was spent in counseling/coordination of care: YES  POSSIBLE D/C IN 2-3 DAYS, DEPENDING ON CLINICAL CONDITION.   Dymir Neeson M.D on 11/08/2018 at 11:38 AM  Between 7am to 6pm - Pager - [phone]  After 6pm go to  [email or web address] - Proofreader  [facility] [place] [facility]  Office  [phone]  CC: Primary care physician; Danae Orleans, MD  Note: This dictation was prepared with Dragon dictation along with smaller phrase technology. Any transcriptional errors that result from this process are unintentional.

## 2018-11-08 NOTE — Progress Notes (Signed)
OT Cancellation Note  Patient Details Name: Mario Blackburn MRN: [id number] DOB: 1946/02/06   Cancelled Treatment:    Reason Eval/Treat Not Completed: Patient declined, no reason specified. Order received and chart reviewed. Upon arrival to pt room, pt supine in bed. Pt declined to work with OT stating, "my stomach is tore up" and endorsed needing to have a BM. OT informed nsg staff of pt complaints. Will re-attempt at a later time/date as available and pt medically appropriate for OT tx.    Shara Blazing, M.S., OTR/L Ascom: [phone] 11/08/18, 9:19 AM

## 2018-11-08 NOTE — Progress Notes (Signed)
Anticoagulation monitoring(Lovenox):  73 yo  M ordered Lovenox 40 mg Q24h  Filed Weights   11/07/18 0533 11/07/18 2313 11/08/18 0500  Weight: (!) 302 lb (137 kg) 298 lb 11.6 oz (135.5 kg) 298 lb 11.6 oz (135.5 kg)   BMI 44   Lab Results  Component Value Date   CREATININE 1.02 11/08/2018   CREATININE 0.93 11/07/2018   CREATININE 0.87 11/05/2018   Estimated Creatinine Clearance: 90.8 mL/min (by C-G formula based on SCr of 1.02 mg/dL). Hemoglobin & Hematocrit     Component Value Date/Time   HGB 11.9 (L) 11/07/2018 0525   HGB 12.4 (L) 09/09/2013 2101   HCT 36.5 (L) 11/07/2018 0525   HCT 38.8 (L) 09/09/2013 2101     Per Protocol for Patient with estCrcl > 30 ml/min and BMI > 40, will transition to Lovenox 40 mg Q12h.      Chinita Greenland PharmD Clinical Pharmacist 11/08/2018

## 2018-11-08 NOTE — Progress Notes (Signed)
   11/08/18 1100  Clinical Encounter Type  Visited With Patient not available;Health care provider  Visit Type Initial  Referral From Nurse   Referral received for a visit from the patient's nurse and the unit secretary. Upon arrival to the patient's room, he was resting comfortably. Chaplain will check back at a later time.

## 2018-11-08 NOTE — Evaluation (Signed)
Occupational Therapy Evaluation Patient Details Name: Mario Blackburn MRN: [id number] DOB: October 01, 1945 Today's Date: 11/08/2018    History of Present Illness Mario Blackburn is a 73yo male who comes to [facility] on 3/21 c BLE cellulitis and weeping wounds. PMH: HTN, HLD, DM, morbid obesity (used to weight >500lbs per patient). [facility], no longer has hospital bed at home, nor rollator. Pt has been using mechanical lift for transfers in/out of bed at baseline.    Clinical Impression   [name] was seen for OT evaluation this date. Prior to hospital admission, pt was a member of the [employer], but has recently been [place] and no longer recieves services. Pt endorses that since his DC from [facility] he no longer has the necessary equipment for completion of ADLs in the home such as a hospital bed, Lift, WC, etc.  Pt lives in a single level home with his wife, daughter, and SIL. Pt endorses his wife provides the majority of his care and assists him with all IADLs at baseline. Pt stated, "I don't get out of the house" when asked about community involvement/ambulation. Currently pt demonstrates impairments in strength, balance, activity tolerance, and ROM. He requires +2 mod/max assist for all ADL and requires lift equipment for functional transfers. Pt uses WC at baseline for mobility, however, it is unclear what type of chair is available for pt to use since DC from [facility].  Pt would benefit from skilled OT to address noted impairments and functional limitations (see below for any additional details) in order to maximize safety and independence while minimizing falls risk and caregiver burden.  Recommend pt discharge to STR for improved safety and functional return upon hospital DC.     Follow Up Recommendations  Supervision/Assistance - 24 hour;SNF    Equipment Recommendations  (TBD)    Recommendations for Other Services       Precautions / Restrictions  Precautions Precautions: Fall Restrictions Weight Bearing Restrictions: No      Mobility Bed Mobility Overal bed mobility: Needs Assistance Bed Mobility: Sidelying to Sit;Supine to Sit;Rolling Rolling: Min assist Sidelying to sit: Min guard Supine to sit: Min assist(assistance with legs)     General bed mobility comments: Deferred d/t pt pain level. Per PT pt is MAX assist +2 for supine scooting up in bed, however, is able to complete some bed mobility (rolling & sidelying<>sit) with min assist for mgt of B LE.   Transfers Overall transfer level: (did not attempt; pt in pain, fearful of falling, weepign leg wounds, and reports mostly hoyer lift transfers PTA )               General transfer comment: Deferred. Per pt and chart review pt unable to stand. At this time pt requires lift equipment for transfers.     Balance Overall balance assessment: Needs assistance Sitting-balance support: Feet supported Sitting balance-Leahy Scale: Good       Standing balance-Leahy Scale: Zero                             ADL either performed or assessed with clinical judgement   ADL Overall ADL's : Needs assistance/impaired Eating/Feeding: Set up;Independent;Bed level   Grooming: Set up;Independent;Bed level   Upper Body Bathing: Set up;Moderate assistance;Bed level   Lower Body Bathing: Set up;Maximal assistance;Bed level   Upper Body Dressing : Set up;Bed level;Moderate assistance;Minimal assistance   Lower Body Dressing: Set up;Maximal assistance;Bed  level   Toilet Transfer: Total assistance;+2 for physical assistance;BSC Toilet Transfer Details (indicate cue type and reason): Pt requires use of lift equipment for any transfers at this time.  Toileting- Clothing Manipulation and Hygiene: Set up;Moderate assistance;Bed level         General ADL Comments: Pt requires significant assist for ADL at baseline. At this time, requires bed-level for toileting and ADLs.       Vision Baseline Vision/History: Wears glasses Wears Glasses: Reading only;Distance only(For reading and driving (when he drove).) Patient Visual Report: No change from baseline       Perception     Praxis      Pertinent Vitals/Pain Pain Assessment: 0-10 Pain Score: 10-Worst pain ever Pain Location: BLE, "in my knee caps", lower back Pain Descriptors / Indicators: Sore;Constant;Grimacing Pain Intervention(s): Limited activity within patient's tolerance;Monitored during session;Repositioned;Utilized relaxation techniques;Patient requesting pain meds-RN notified     Hand Dominance Right   Extremity/Trunk Assessment Upper Extremity Assessment Upper Extremity Assessment: Generalized weakness(Grossly 3/5. Noted decreased strength in grip. Pt endorses pain with active shoulder flexion above 90. Decreased [place] with opposition in B UE. )   Lower Extremity Assessment Lower Extremity Assessment: Generalized weakness;Defer to PT evaluation( Pt endorsing significant (10/10) pain t/o B LE. )       Communication Communication Communication: HOH   Cognition Arousal/Alertness: Awake/alert Behavior During Therapy: WFL for tasks assessed/performed Overall Cognitive Status: No family/caregiver present to determine baseline cognitive functioning                                 General Comments: Pt oriented to self and situation. When asked where he was he said, "I think I'm in the hospital" but was unsure which hospital. Could not state date, however did know day of the week. Unable to give year. No family member present to determine baseline level of cognition.    General Comments  Pt at times difficult to understand and poor historian. Timeline for equipment use/PLOF difficult to determine. No family member present to verify information.     Exercises Other Exercises Other Exercises: Pt educated in safe use of AE for ADL mgt in order to improve functional independence with  ADL tasks, cognitive behavioral strategies for pain mgt including breathing strategies and distraction techniques, and bed level UE ther ex (elbow/wrist flex/ext).    Shoulder Instructions      Home Living Family/patient expects to be discharged to:: Private residence Living Arrangements: Spouse/significant other Available Help at Discharge: Family Type of Home: House Home Access: Ramped entrance     [place]: One level                   Additional Comments: Pt reports he [facility] and they have taken hospital bed, 4WW, and his usual WC. He has a different WC now and he reports they plan to take his mechanical lift soon.        Prior Functioning/Environment Level of Independence: Needs assistance  Gait / Transfers Assistance Needed: Patient is not ambulatory at baseline. Was able using mechanical lift for transfers >1 year. Recently been able to take some steps in home prior to hurting his leg.  ADL's / Homemaking Assistance Needed: Requires assistance at baseline. Pt endorses his wife assists will all IADL tasks. Pt endorses dressing and sleeping in WC. Able to transfer from [facility] to [facility] (already in the home)  for toileting. Requires assistance with bathing. Does not get into bath/shower; endorses fears of falling.             OT Problem List: Decreased strength;Impaired balance (sitting and/or standing);Decreased cognition;Pain;Decreased range of motion;Decreased safety awareness;Obesity;Decreased activity tolerance;Decreased coordination;Decreased knowledge of use of DME or AE;Impaired UE functional use      OT Treatment/Interventions: Self-care/ADL training;Therapeutic exercise;Energy conservation;Patient/family education;Manual therapy;Balance training;Therapeutic activities;DME and/or AE instruction;Cognitive remediation/compensation;Modalities    OT Goals(Current goals can be found in the care plan section) Acute Rehab OT Goals Patient Stated  Goal: to return home OT Goal Formulation: With patient Time For Goal Achievement: 11/22/18 Potential to Achieve Goals: Good ADL Goals Pt Will Perform Grooming: sitting;with set-up(With LRAD for safety and functional independence.) Pt Will Perform Upper Body Bathing: sitting;with adaptive equipment;with set-up(With LRAD for safety and functional independence.) Pt Will Perform Upper Body Dressing: with set-up;Independently;sitting;with adaptive equipment(With LRAD for safety and functional independence.) Pt Will Perform Toileting - Clothing Manipulation and hygiene: with adaptive equipment;sitting/lateral leans;with set-up;with mod assist(With LRAD for safety and functional independence.)  OT Frequency: Min 2X/week   Barriers to D/C: Inaccessible home environment;Decreased caregiver support          Co-evaluation              AM-PAC OT "6 Clicks" Daily Activity     Outcome Measure Help from another person eating meals?: A Little Help from another person taking care of personal grooming?: A Little Help from another person toileting, which includes using toliet, bedpan, or urinal?: Total Help from another person bathing (including washing, rinsing, drying)?: A Lot Help from another person to put on and taking off regular upper body clothing?: A Little Help from another person to put on and taking off regular lower body clothing?: A Lot 6 Click Score: 14   End of Session Nurse Communication: Patient requests pain meds(Pt repositioned in bed with pillow under back for comfort. Pt requesting to speak with his wife. )  Activity Tolerance: Patient limited by pain Patient left: in bed;with call bell/phone within reach;with bed alarm set  OT Visit Diagnosis: Other abnormalities of gait and mobility (R26.89);Muscle weakness (generalized) (M62.81);Pain Pain - Right/Left: (Both) Pain - part of body: Leg;Ankle and joints of foot                Time: 2671-2458 OT Time Calculation (min): 28  min Charges:  OT General Charges $OT Visit: 1 Visit OT Evaluation $OT Eval Moderate Complexity: 1 Mod OT Treatments $Self Care/Home Management : 8-22 mins $Therapeutic Exercise: 8-22 mins  Shara Blazing, M.S., OTR/L Ascom: [phone] 11/08/18, 3:21 PM

## 2018-11-08 NOTE — Consult Note (Addendum)
[facility] Nurse wound consult note Reason for Consult: Patient seen in follow up from [facility] Nurse visit on 3/18 by my partner, M. Austin. At that time, intertriginous dermatitis between toes was being treated with silver hydrofiber. Wound type: Venous insufficiency with history of non adherence to POC, specifically compression. Erythema and hemosiderin staining present.  Clinical CEAP 5. Pressure Injury POA: NA Measurement:N/A Wound bed:N/A Drainage (amount, consistency, odor) None Periwound: Dried exudate in the the interdigital spaces between toes Dressing procedure/placement/frequency: Patient reports to PT in my presence that his hospital bed, wheelchair and walker were recently "taken away". He reports he has no option at home than to sit in his chair with his legs down. He reports that his "regular bed is too high for him to get up on" and that he has fallen trying to do so in the past.  Legs R>L have improved with elevation. They are dry and peeling and require routine cleansing and emollient application. I will provide those orders for staff today. Wraps will be soiled with incontinence resulting in irritant contact dermatitis if applied and there is no plan in place for UI. Elevation will help-either in chair or in bed. Patient would benefit from follow up in the outpatient [facility] of his choosing.  MASD is improving with routine care.   PT is exploring DME and HHPT needs in anticipation of discharge in next few days. Patient may also benefit from [facility] post discharge.   I will provide a pressure redistribution chair pad that can be wiped clean if soiled for his chair.  [place] nursing team will not follow, but will remain available to this patient, the nursing and medical teams.  Please re-consult if needed. Thanks, Maudie Flakes, MSN, RN, [place], Arther Abbott  Pager# [phone]

## 2018-11-08 NOTE — Evaluation (Addendum)
Physical Therapy Evaluation Patient Details Name: Mario Blackburn MRN: [id number] DOB: 24-Apr-1946 Today's Date: 11/08/2018   History of Present Illness  Mario Blackburn is a 73yo male who comes to [facility] on 3/[facility] c BLE cellulitis and weeping wounds. PMH: HTN, HLD, DM, morbid obesity (used to weight >500lbs per patient). [facility], no longer has hospital bed at home, nor rollator. Pt has been using mechanical lift for transfers in/out of bed at baseline.   Clinical Impression  Pt admitted with above diagnosis. Pt currently with functional limitations due to the deficits listed below (see "PT Problem List"). Upon entry, pt in bed, NA present assisting with clean-up and pericare. The pt is awake and agreeable to participate. The pt is alert and oriented x3, pleasant, conversational, and following simple commands consistently. Pt is limited at times as a historian, difficulty with timelines, and intermittent cues needed to attend to topic and refrain from tangential conversation. Pt assisted with rolling in bed for pericare, and supine to/from sitting. Pt also educated on lateral scooting at EOB for ease in repositioning. This is somewhat limited due to fears of falling and large habitus. Functional mobility assessment demonstrates increased effort/time requirements, poor tolerance, and need for physical assistance, whereas the patient performed these at a higher level of independence PTA. Pt reports baseline mechanical lift transfers which is concerning as most of his medical equipment has been removed from the home by [facility] as the patient is no longer under their care. This will limit the ability of caregivers to assist the patient upon return to home. Pt will benefit from skilled PT intervention to increase independence and safety with basic mobility in preparation for discharge to the venue listed below.       Follow Up Recommendations SNF(pt planning on return to home)    Equipment  Recommendations  (if pt returns to home will need extensive DME: hospital bed, wheelchair, wheelchair cushion, mechanical lift, [id number])    Recommendations for Other Services       Precautions / Restrictions Precautions Precautions: Fall Restrictions Weight Bearing Restrictions: No      Mobility  Bed Mobility Overal bed mobility: Needs Assistance Bed Mobility: Sidelying to Sit;Supine to Sit;Rolling Rolling: Min assist Sidelying to sit: Min guard Supine to sit: Min assist(assistance with legs)     General bed mobility comments: MAxAssist+2 for supine scooting up in bed while seated EOB   Transfers Overall transfer level: (did not attempt; pt in pain, fearful of falling, weepign leg wounds, and reports mostly hoyer lift transfers PTA )                  Ambulation/Gait                Stairs            Wheelchair Mobility    Modified Rankin (Stroke Patients Only)       Balance Overall balance assessment: Needs assistance Sitting-balance support: Feet supported Sitting balance-Leahy Scale: Good       Standing balance-Leahy Scale: Zero                               Pertinent Vitals/Pain Pain Assessment: 0-10 Pain Score: 10-Worst pain ever Pain Location: BLE Pain Descriptors / Indicators: Aching;Discomfort;Sore;Constant Pain Intervention(s): Limited activity within patient's tolerance;Monitored during session(RLE wound weeping blood drops when down on floor at anterior ankle )    Home Living Family/patient expects  to be discharged to:: Private residence Living Arrangements: Spouse/significant other Available Help at Discharge: Family Type of Home: House Home Access: [place]: One level   Additional Comments: Pt reports he was recently DC from the [place] program and they have taken hospital bed, 4WW, and his usual WC. HE has a different WC now and he reports they plan to take his mechanical lift soon.       Prior Function Level of Independence: Needs assistance   Gait / Transfers Assistance Needed: Patient is not ambulatory since June 2019, and very minimal AMB prior to that. Was able using mechanical lift for transfers >1 year, sometimes +4 pivot transfers to [facility]. ADL's / Homemaking Assistance Needed: Requires assistance        Hand Dominance        Extremity/Trunk Assessment   Upper Extremity Assessment Upper Extremity Assessment: Generalized weakness    Lower Extremity Assessment Lower Extremity Assessment: Generalized weakness       Communication   Communication: HOH  Cognition Arousal/Alertness: Awake/alert Behavior During Therapy: WFL for tasks assessed/performed Overall Cognitive Status: Within Functional Limits for tasks assessed                                        General Comments      Exercises     Assessment/Plan    PT Assessment Patient needs continued PT services  PT Problem List Decreased strength;Pain;Decreased range of motion;Decreased mobility;Decreased knowledge of use of DME;Obesity;Decreased activity tolerance;Decreased skin integrity       PT Treatment Interventions DME instruction;Functional mobility training;Patient/family education;Therapeutic activities;Therapeutic exercise    PT Goals (Current goals can be found in the Care Plan section)  Acute Rehab PT Goals Patient Stated Goal: to return home PT Goal Formulation: With patient Time For Goal Achievement: 11/22/18 Potential to Achieve Goals: Fair    Frequency Min 2X/week   Barriers to discharge Decreased caregiver support      Co-evaluation               AM-PAC PT "6 Clicks" Mobility  Outcome Measure Help needed turning from your back to your side while in a flat bed without using bedrails?: A Little Help needed moving from lying on your back to sitting on the side of a flat bed without using bedrails?: A Lot Help needed moving to and from a bed to  a chair (including a wheelchair)?: Total Help needed standing up from a chair using your arms (e.g., wheelchair or bedside chair)?: Total Help needed to walk in hospital room?: Total Help needed climbing 3-5 steps with a railing? : Total 6 Click Score: 9    End of Session   Activity Tolerance: Patient tolerated treatment well;Patient limited by fatigue;Patient limited by pain Patient left: in bed;with bed alarm set;with call bell/phone within reach;Other (comment)(Legs elevated; RLE>LLE at pt request) Nurse Communication: Mobility status PT Visit Diagnosis: Muscle weakness (generalized) (M62.81);Other abnormalities of gait and mobility (R26.89)    Time: 7628-3151 PT Time Calculation (min) (ACUTE ONLY): 25 min   Charges:   PT Evaluation $PT Eval Moderate Complexity: 1 Mod PT Treatments $Therapeutic Exercise: 8-22 mins       12:34 PM, 11/08/18 Etta Grandchild, PT, DPT Physical Therapist - [facility]  [phone] ([place])    , C 11/08/2018, 12:29 PM

## 2018-11-09 LAB — GLUCOSE, CAPILLARY
Glucose-Capillary: 99 mg/dL (ref 70–99)
Glucose-Capillary: 118 mg/dL — ABNORMAL HIGH (ref 70–99)

## 2018-11-09 MED ORDER — LEVOFLOXACIN 750 MG PO TABS: 750.0000 mg | ORAL_TABLET | Freq: Every day | ORAL | 0 refills | Status: AC

## 2018-11-09 NOTE — Discharge Summary (Signed)
[facility] NAME: Mario Blackburn    MR#:  [id number]  DATE OF BIRTH:  1945/12/04  DATE OF ADMISSION:  11/05/2018   ADMITTING PHYSICIAN: Harrie Foreman, MD  DATE OF DISCHARGE: 11/09/2018  PRIMARY CARE PHYSICIAN: Danae Orleans, MD   ADMISSION DIAGNOSIS:  Wound infection [T14.8XXA, L08.9] Cellulitis of right leg [L03.115] Chronic venous stasis [I87.8] DISCHARGE DIAGNOSIS:  Active Problems:   Cellulitis  SECONDARY DIAGNOSIS:   Past Medical History:  Diagnosis Date  . Arthritis   . Asthma   . Cancer ([place])   . CHF (congestive heart failure) ([place])   . Diabetes mellitus without complication ([place])   . Gout   . Hyperlipemia   . Hypertension   . Osteoarthritis   . Stroke [facility])    HOSPITAL COURSE:   Chief Complaint: Leg wounds  HPI:  The patient with past medical history of CHF, diabetes, hypertension and stroke presented to the emergency department complaining of weeping legs and painful swelling.  The patient was discharged earlier but did not receive durable medical equipment that was supposed to be delivered by home health.  His wife states that she cannot help the patient ambulate safely and the patient complains of pain and purulent drainage that he cannot address on his own.  Due to the inability to care for himself at home the emergency department staff called the hospitalist service for further management.  Please refer to the H&P dictated for further details   Hospital course; 1.   Right lower extremity cellulitis: On examination of right lower extremities , no evidence of any open wounds with purulent drainage.  Patient was diagnosed and treated for right lower extremity cellulitis.  Was recently discharged on p.o. clindamycin prior to readmission.  During the course of this admission was treated with IV levofloxacin with significant improvement clinically.  Being discharged on p.o. levofloxacin to complete  treatment duration.   2. Diabetes mellitus type 2:  Resumed home insulin regimen.  Outpatient monitoring by primary care physician   3. Hypertension: Controlled at this time;  Resumed home blood pressure meds.    4. Hypothyroidism: TSH normal at 2.574. Continue Synthroid  5.  Debility due to multiple medical problems. Patient evaluated by physical therapist during this admission.  Patient reported that at baseline uses mechanical lift for transfer and wheelchair.  Nonambulatory.  Declined skilled nursing facility placement.  Patient's wife initially was requesting for skilled nursing facility placement but patient absolutely declined stating that he has enough help at home with the wife and other family members.  Patient awake and alert and able to make his own decision.  Wife agreeable to taking patient home with home health services. Case manager already made appropriate arrangements and all equipments delivered to the house prior to discharge from the hospital today.   DISCHARGE CONDITIONS:  Stable CONSULTS OBTAINED:   DRUG ALLERGIES:   Allergies  Allergen Reactions  . Morphine And Related Other (See Comments)    Pt states that this medication put him in a coma.  . Percocet [Oxycodone-Acetaminophen] Other (See Comments)    Pt states that this medication put him in a coma.  . Benadryl [Diphenhydramine Hcl] Hives  . Keflex [Cephalexin] Hives  . Penicillins Hives and Other (See Comments)    Has patient had a PCN reaction causing immediate rash, facial/tongue/throat swelling, SOB or lightheadedness with hypotension: No Has patient had a PCN reaction causing severe rash involving mucus membranes or  skin necrosis: No Has patient had a PCN reaction that required hospitalization No Has patient had a PCN reaction occurring within the last 10 years: No If all of the above answers are "NO", then may proceed with Cephalosporin use.  . Bactrim [Sulfamethoxazole-Trimethoprim] Other  (See Comments)    Reaction:  Rash- Care Everywhere (chart review) 06/2018 note   DISCHARGE MEDICATIONS:   Allergies as of 11/09/2018      Reactions   Morphine And Related Other (See Comments)   Pt states that this medication put him in a coma.   Percocet [oxycodone-acetaminophen] Other (See Comments)   Pt states that this medication put him in a coma.   Benadryl [diphenhydramine Hcl] Hives   Keflex [cephalexin] Hives   Penicillins Hives, Other (See Comments)   Has patient had a PCN reaction causing immediate rash, facial/tongue/throat swelling, SOB or lightheadedness with hypotension: No Has patient had a PCN reaction causing severe rash involving mucus membranes or skin necrosis: No Has patient had a PCN reaction that required hospitalization No Has patient had a PCN reaction occurring within the last 10 years: No If all of the above answers are "NO", then may proceed with Cephalosporin use.   Bactrim [sulfamethoxazole-trimethoprim] Other (See Comments)   Reaction:  Rash- Care Everywhere (chart review) 06/2018 note      Medication List    STOP taking these medications   clindamycin 150 MG capsule Commonly known as:  CLEOCIN     TAKE these medications   acetaminophen 650 MG CR tablet Commonly known as:  TYLENOL Take 650 mg by mouth 2 (two) times daily.   albuterol (2.5 MG/3ML) 0.083% nebulizer solution Commonly known as:  PROVENTIL Take 2.5 mg by nebulization 4 (four) times daily as needed for wheezing or shortness of breath.   amLODipine 10 MG tablet Commonly known as:  NORVASC Take 1 tablet (10 mg total) by mouth at bedtime.   ARIPiprazole 5 MG tablet Commonly known as:  ABILIFY Take 5 mg by mouth daily.   aspirin EC 81 MG tablet Take 81 mg by mouth daily.   atorvastatin 80 MG tablet Commonly known as:  LIPITOR Take 80 mg by mouth at bedtime.   Benzocaine-Menthol 15-3.6 MG Lozg Take 1 lozenge by mouth as needed.   Combigan 0.2-0.5 % ophthalmic solution  Generic drug:  brimonidine-timolol Place 1 drop into both eyes 2 (two) times daily.   Desitin 13 % Crea Generic drug:  Zinc Oxide Apply 1 application topically 2 (two) times daily. Apply to scrotum   diclofenac sodium 1 % Gel Commonly known as:  VOLTAREN Apply 2-4 g topically 2 (two) times daily. Apply 2g to right shoulder and 4g to right knee   divalproex 500 MG 24 hr tablet Commonly known as:  DEPAKOTE ER Take 1,000-1,500 mg by mouth 2 (two) times daily. Take 3 tablets in the morning, and 2 tablets in the afternoon   fluticasone 50 MCG/ACT nasal spray Commonly known as:  FLONASE Place 1 spray into both nostrils daily.   Fluticasone-Salmeterol 250-50 MCG/DOSE Aepb Commonly known as:  ADVAIR Inhale 1 puff into the lungs 2 (two) times daily.   furosemide 40 MG tablet Commonly known as:  LASIX Take 40 mg by mouth daily.   gabapentin 300 MG capsule Commonly known as:  NEURONTIN Take 600 mg by mouth at bedtime.   insulin aspart 100 UNIT/ML injection Commonly known as:  novoLOG Inject 12-14 Units into the skin 2 (two) times daily with a meal. 12 units with  breakfast and 14 units with dinner   insulin glargine 100 UNIT/ML injection Commonly known as:  LANTUS Inject 10 Units into the skin at bedtime.   levofloxacin 750 MG tablet Commonly known as:  Levaquin Take 1 tablet (750 mg total) by mouth daily for 7 days.   levothyroxine 50 MCG tablet Commonly known as:  SYNTHROID, LEVOTHROID Take 50 mcg by mouth daily before breakfast.   lisinopril 40 MG tablet Commonly known as:  PRINIVIL,ZESTRIL Take 40 mg by mouth 2 (two) times daily.   loperamide 2 MG tablet Commonly known as:  IMODIUM A-D Take 2 mg by mouth every hour as needed for diarrhea or loose stools.   Lumigan 0.01 % Soln Generic drug:  bimatoprost Place 1 drop into both eyes at bedtime.   magnesium oxide 400 MG tablet Commonly known as:  MAG-OX Take 600 mg by mouth daily.   metoprolol succinate 25 MG 24 hr  tablet Commonly known as:  TOPROL-XL Take 25 mg by mouth daily.   mupirocin ointment 2 % Commonly known as:  BACTROBAN Place 1 application into the nose 2 (two) times daily.   nitroGLYCERIN 0.4 MG SL tablet Commonly known as:  NITROSTAT Place 0.4 mg under the tongue every 5 (five) minutes as needed for chest pain.   nystatin-triamcinolone cream Commonly known as:  MYCOLOG II Apply 1 application topically 2 (two) times daily.   pantoprazole 40 MG tablet Commonly known as:  PROTONIX Take 1 tablet (40 mg total) by mouth daily.   ranitidine 150 MG tablet Commonly known as:  ZANTAC Take 150 mg by mouth 2 (two) times daily.   spironolactone 25 MG tablet Commonly known as:  ALDACTONE Take 25 mg by mouth daily.   tiotropium 18 MCG inhalation capsule Commonly known as:  SPIRIVA Place 1 capsule (18 mcg total) into inhaler and inhale daily.   traMADol 50 MG tablet Commonly known as:  ULTRAM Take 50 mg by mouth every 6 (six) hours as needed for pain.   venlafaxine XR 75 MG 24 hr capsule Commonly known as:  EFFEXOR-XR Take 75 mg by mouth daily. Take with 150mg  capsule for 225mg    venlafaxine XR 150 MG 24 hr capsule Commonly known as:  EFFEXOR-XR Take 150 mg by mouth daily with breakfast. Take with 75mg  capsule for 225mg    vitamin A & D ointment Apply 1 application topically 3 (three) times daily.        DISCHARGE INSTRUCTIONS:   DIET:  Diabetic diet DISCHARGE CONDITION:  Stable ACTIVITY:  Activity as tolerated OXYGEN:  Home Oxygen: No.  Oxygen Delivery: room air DISCHARGE LOCATION:  home   If you experience worsening of your admission symptoms, develop shortness of breath, life threatening emergency, suicidal or homicidal thoughts you must seek medical attention immediately by calling 911 or calling your MD immediately  if symptoms less severe.  You Must read complete instructions/literature along with all the possible adverse reactions/side effects for all the  Medicines you take and that have been prescribed to you. Take any new Medicines after you have completely understood and accpet all the possible adverse reactions/side effects.   Please note  You were cared for by a hospitalist during your hospital stay. If you have any questions about your discharge medications or the care you received while you were in the hospital after you are discharged, you can call the unit and asked to speak with the hospitalist on call if the hospitalist that took care of you is not available. Once  you are discharged, your primary care physician will handle any further medical issues. Please note that NO REFILLS for any discharge medications will be authorized once you are discharged, as it is imperative that you return to your primary care physician (or establish a relationship with a primary care physician if you do not have one) for your aftercare needs so that they can reassess your need for medications and monitor your lab values.    On the day of Discharge:  VITAL SIGNS:  Blood pressure 113/61, pulse 64, temperature (!) 97.5 F (36.4 C), temperature source Oral, resp. rate 18, height 5\' 9"  (1.753 m), weight 135.5 kg, SpO2 99 %. PHYSICAL EXAMINATION:  GENERAL:  73 y.o.-year-old patient lying in the bed with no acute distress.  EYES: Pupils equal, round, reactive to light and accommodation. No scleral icterus. Extraocular muscles intact.  HEENT: Head atraumatic, normocephalic. Oropharynx and nasopharynx clear.  NECK:  Supple, no jugular venous distention. No thyroid enlargement, no tenderness.  LUNGS: Normal breath sounds bilaterally, no wheezing, rales,rhonchi or crepitation. No use of accessory muscles of respiration.  CARDIOVASCULAR: S1, S2 normal. No murmurs, rubs, or gallops.  ABDOMEN: Soft, non-tender, non-distended. Bowel sounds present. No organomegaly or mass.  EXTREMITIES: No pedal edema, cyanosis, or clubbing.  NEUROLOGIC: Cranial nerves II through XII  are intact. Muscle strength 5/5 in all extremities. Sensation intact. Gait not checked.  PSYCHIATRIC: The patient is alert and oriented x 3.  SKIN: No obvious rash, lesion, or ulcer.  DATA REVIEW:   CBC Recent Labs  Lab 11/07/18 0525  WBC 9.4  HGB 11.9*  HCT 36.5*  PLT 301    Chemistries  Recent Labs  Lab 11/02/18 1235  11/08/18 0325  NA 138   < > 140  K 4.0   < > 4.4  CL 101   < > 101  CO2 27   < > 31  GLUCOSE 137*   < > 126*  BUN 18   < > 23  CREATININE 0.79   < > 1.02  CALCIUM 8.8*   < > 8.9  MG  --    < > 1.8  AST 19  --   --   ALT 13  --   --   ALKPHOS 56  --   --   BILITOT 0.5  --   --    < > = values in this interval not displayed.     Microbiology Results  Results for orders placed or performed during the hospital encounter of 11/05/18  Blood culture (routine x 2)     Status: None (Preliminary result)   Collection Time: 11/06/18  6:25 AM  Result Value Ref Range Status   Specimen Description BLOOD RIGHT ANTECUBITAL  Final   Special Requests   Final    BOTTLES DRAWN AEROBIC AND ANAEROBIC Blood Culture adequate volume   Culture   Final    NO GROWTH 3 DAYS Performed at [facility], [address], [place], [place] [address]    Report Status PENDING  Incomplete  Blood culture (routine x 2)     Status: None (Preliminary result)   Collection Time: 11/06/18  6:38 AM  Result Value Ref Range Status   Specimen Description BLOOD RIGHT HAND  Final   Special Requests   Final    BOTTLES DRAWN AEROBIC AND ANAEROBIC Blood Culture adequate volume   Culture   Final    NO GROWTH 3 DAYS Performed at [facility], [address],  [place],  [address]    Report Status PENDING  Incomplete    RADIOLOGY:  No results found.   Management plans discussed with the patient, family and they are in agreement.  CODE STATUS: DNR   TOTAL TIME TAKING CARE OF THIS PATIENT: 39 minutes.    Majestic Molony M.D on 11/09/2018 at 10:50 AM  Between 7am to  6pm - Pager - [phone]  After 6pm go to [email or web address] - Proofreader  [facility]  Office  [phone]  CC: Primary care physician; Danae Orleans, MD   Note: This dictation was prepared with Dragon dictation along with smaller phrase technology. Any transcriptional errors that result from this process are unintentional.

## 2018-11-09 NOTE — Progress Notes (Signed)
Discharge summary reviewed with verbal understanding. EMS to transport to home.

## 2018-11-09 NOTE — TOC Transition Note (Signed)
Transition of Care (TOC) - CM/SW Discharge Note  Patient to discharge home via EMS transport, DME delivered to the home yesterday, Notified the wife [name] that he will DC home today with EMS, Patient decided to not be a DNR, Dr. Stark Jock confirmed via telephone call with patient and will remove the DNR order.   Patient Details  Name: Mario Blackburn MRN: [id number] Date of Birth: [birth year]  Transition of Care [facility]) CM/SW Contact:  Su Hilt, RN Phone Number: 11/09/2018, 12:08 PM   Clinical Narrative:       Final next level of care: [place] Barriers to Discharge: Barriers Resolved   Patient Goals and CMS Choice   CMS Medicare.gov Compare Post Acute Care list provided to:: Patient    Discharge Placement                       Discharge Plan and Services   Discharge Planning Services: CM Consult                      Social Determinants of Health (SDOH) Interventions     Readmission Risk Interventions Readmission Risk Prevention Plan 11/04/2018  Transportation Screening Complete  PCP or Specialist Appt within 3-5 Days Complete  HRI or Home Care Consult Complete  Palliative Care Screening Patient Refused  Medication Review (RN Care Manager) Complete  Some recent data might be hidden

## 2018-11-09 NOTE — TOC Progression Note (Signed)
Transition of Care [facility]) - Progression Note    Patient Details  Name: Mario Blackburn MRN: [id number] Date of Birth: 1945-09-07  Transition of Care [facility] CM/SW [place], RN Phone Number: 11/09/2018, 9:03 AM  Clinical Narrative:    Damaris Schooner with Brad with [employer] and he confirmed the patient had a Bed, a Hoyer lift and a wheelchair delivered 3/23 between   3-7 PM to the home     Barriers to Discharge: Continued Medical Work up  Expected Discharge Plan and Services     Discharge Planning Services: CM Consult   Living arrangements for the past 2 months: Single Family Home                           Social Determinants of Health (SDOH) Interventions    Readmission Risk Interventions Readmission Risk Prevention Plan 11/04/2018  Transportation Screening Complete  PCP or Specialist Appt within 3-5 Days Complete  HRI or Home Care Consult Complete  Palliative Care Screening Patient Refused  Medication Review (RN Care Manager) Complete  Some recent data might be hidden

## 2018-11-09 NOTE — Progress Notes (Addendum)
Occupational Therapy Treatment Patient Details Name: Mario Blackburn MRN: [id number] DOB: 01-26-1946 Today's Date: 11/09/2018    History of present illness Mario Blackburn is a 73yo male who [facility] 3[facility] c BLE cellulitis and weeping wounds. PMH: HTN, HLD, DM, morbid obesity (used to weight >500lbs per patient). Pt recent DC from [facility] program, no longer has hospital bed at home, nor rollator. Pt has been using mechanical lift for transfers in/out of bed at baseline.    OT comments  Pt. required redirection at times during the session, as pt. perseverated on issues with the PACE program. Pt. presented with no gown on upon arrival to the room. Pt. was assisted with donning a gown. Pt. reports having no clothing available here at the hospital.  Pt. education was provided verbally about general A/E use, and reacher use.  Pt. declined attempt for B UE there. Ex. secondary to reporting that UEs are sore from IV attempts. Pt. Continues to benefit from OT services for ADL training, A/E training, and pt. education about home modification, and DME. Pt. plans to return home upon discharge, with family assistance as needed.   Follow Up Recommendations  Supervision/Assistance - 24 hour    Equipment Recommendations       Recommendations for Other Services      Precautions / Restrictions Precautions Precautions: Fall Restrictions Weight Bearing Restrictions: No       Mobility Bed Mobility                  Transfers    Pt. Seen at bed level                  Balance                                           ADL either performed or assessed with clinical judgement   ADL Overall ADL's : Needs assistance/impaired Eating/Feeding: Set up;Independent;Bed level   Grooming: Set up;Independent;Bed level   Upper Body Bathing: Set up;Moderate assistance;Bed level   Lower Body Bathing: Set up;Maximal assistance;Bed level   Upper Body Dressing : Set up;Bed  level;Moderate assistance;Minimal assistance Upper Body Dressing Details (indicate cue type and reason): gown Lower Body Dressing: Set up;Maximal assistance;Bed level                 General ADL Comments: Pt. edcuation was provinded about A/E use for LE ADLs.     Vision Baseline Vision/History: Wears glasses Wears Glasses: Reading only;Distance only Patient Visual Report: No change from baseline     Perception     Praxis      Cognition Arousal/Alertness: Awake/alert Behavior During Therapy: WFL for tasks assessed/performed Overall Cognitive Status: No family/caregiver present to determine baseline cognitive functioning                                          Exercises     Shoulder Instructions       General Comments      Pertinent Vitals/ Pain       Pain Assessment: 0-10 Pain Score: 5  Pain Location: BLE, "in my knee caps", lower back Pain Descriptors / Indicators: Aching;Sore Pain Intervention(s): Limited activity within patient's tolerance;Monitored during session;Repositioned  Home Living  Prior Functioning/Environment              Frequency  Min 2X/week        Progress Toward Goals  OT Goals(current goals can now be found in the care plan section)  Progress towards OT goals: Not progressing toward goals - comment  Acute Rehab OT Goals Patient Stated Goal: to return home OT Goal Formulation: With patient Potential to Achieve Goals: Good  Plan      Co-evaluation                 AM-PAC OT "6 Clicks" Daily Activity     Outcome Measure   Help from another person eating meals?: A Little Help from another person taking care of personal grooming?: A Little Help from another person toileting, which includes using toliet, bedpan, or urinal?: A Lot Help from another person bathing (including washing, rinsing, drying)?: A Lot Help from another person to put on  and taking off regular upper body clothing?: A Little Help from another person to put on and taking off regular lower body clothing?: A Lot 6 Click Score: 15    End of Session    OT Visit Diagnosis: Other abnormalities of gait and mobility (R26.89);Muscle weakness (generalized) (M62.81);Pain Pain - part of body: Leg;Ankle and joints of foot   Activity Tolerance Patient limited by pain   Patient Left in bed;with call bell/phone within reach;with bed alarm set   Nurse Communication Patient requests pain meds        Time: 7939-0300 OT Time Calculation (min): 23 min  Charges: OT General Charges $OT Visit: 1 Visit OT Treatments $Self Care/Home Management : 23-37 mins  Harrel Carina, MS, OTR/L   Harrel Carina 11/09/2018, 10:48 AM

## 2018-11-09 NOTE — Discharge Instructions (Signed)
Do NOT take Levaquin within 2 hours of Magnesium tablet

## 2018-11-11 LAB — CULTURE, BLOOD (ROUTINE X 2)
Culture: NO GROWTH
Culture: NO GROWTH
Special Requests: ADEQUATE
Special Requests: ADEQUATE

## 2018-11-22 ENCOUNTER — Ambulatory Visit (INDEPENDENT_AMBULATORY_CARE_PROVIDER_SITE_OTHER): Payer: Medicare Other | Admitting: Nurse Practitioner

## 2018-12-06 ENCOUNTER — Other Ambulatory Visit (INDEPENDENT_AMBULATORY_CARE_PROVIDER_SITE_OTHER): Payer: Self-pay | Admitting: Nurse Practitioner

## 2018-12-06 ENCOUNTER — Telehealth (INDEPENDENT_AMBULATORY_CARE_PROVIDER_SITE_OTHER): Payer: Self-pay | Admitting: Nurse Practitioner

## 2018-12-06 NOTE — Telephone Encounter (Signed)
Spoke with the patient's wife and she agreed with Fallon's recommendation.

## 2018-12-06 NOTE — Telephone Encounter (Signed)
Patient was last seen on 09/29/2018 and has no upcoming appt.

## 2018-12-06 NOTE — Telephone Encounter (Signed)
Unfortunately, if she can't get him here to be seen, she should contact EMS for transport to the hospital.  We would not be able to make a home visit and assessment for this issue isn't something that can be done via the telephone or video call.  Also, depending on the extent of infection on his foot, he may need further intervention or extensive antibiotics.

## 2019-04-19 DEATH — deceased

## 2020-03-20 IMAGING — DX DG FOOT COMPLETE 3+V*R*
3 series · 3 of 3 positions shown · non-contrast
Comparison: None.

CLINICAL DATA: Cellulitis

EXAM:
RIGHT FOOT COMPLETE - 3+ VIEW

[foot ap]
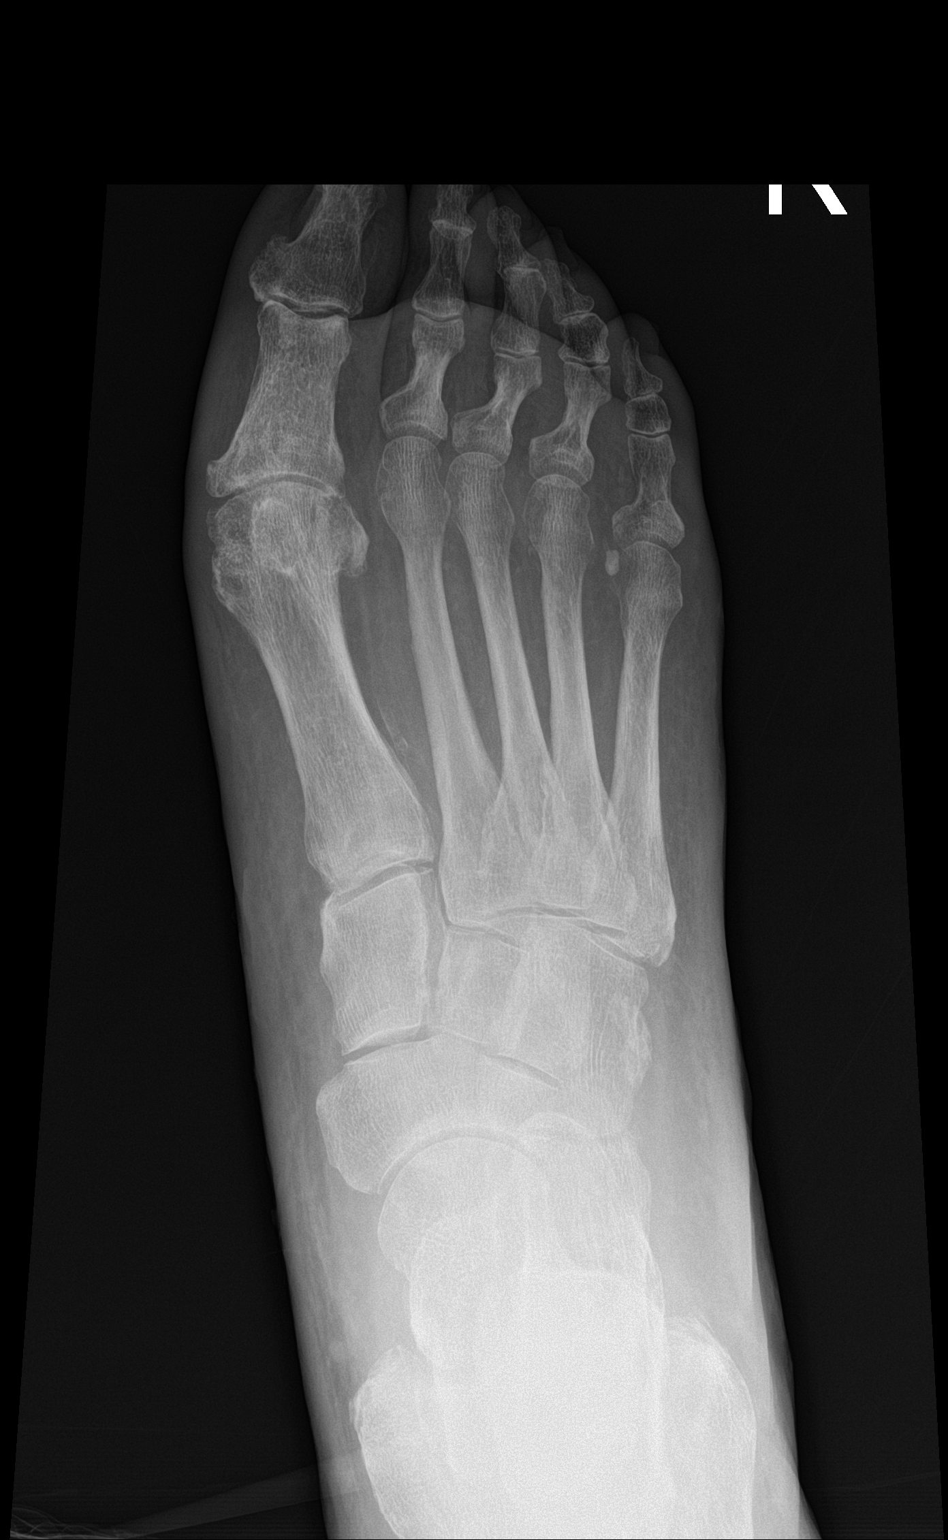

[foot obl]
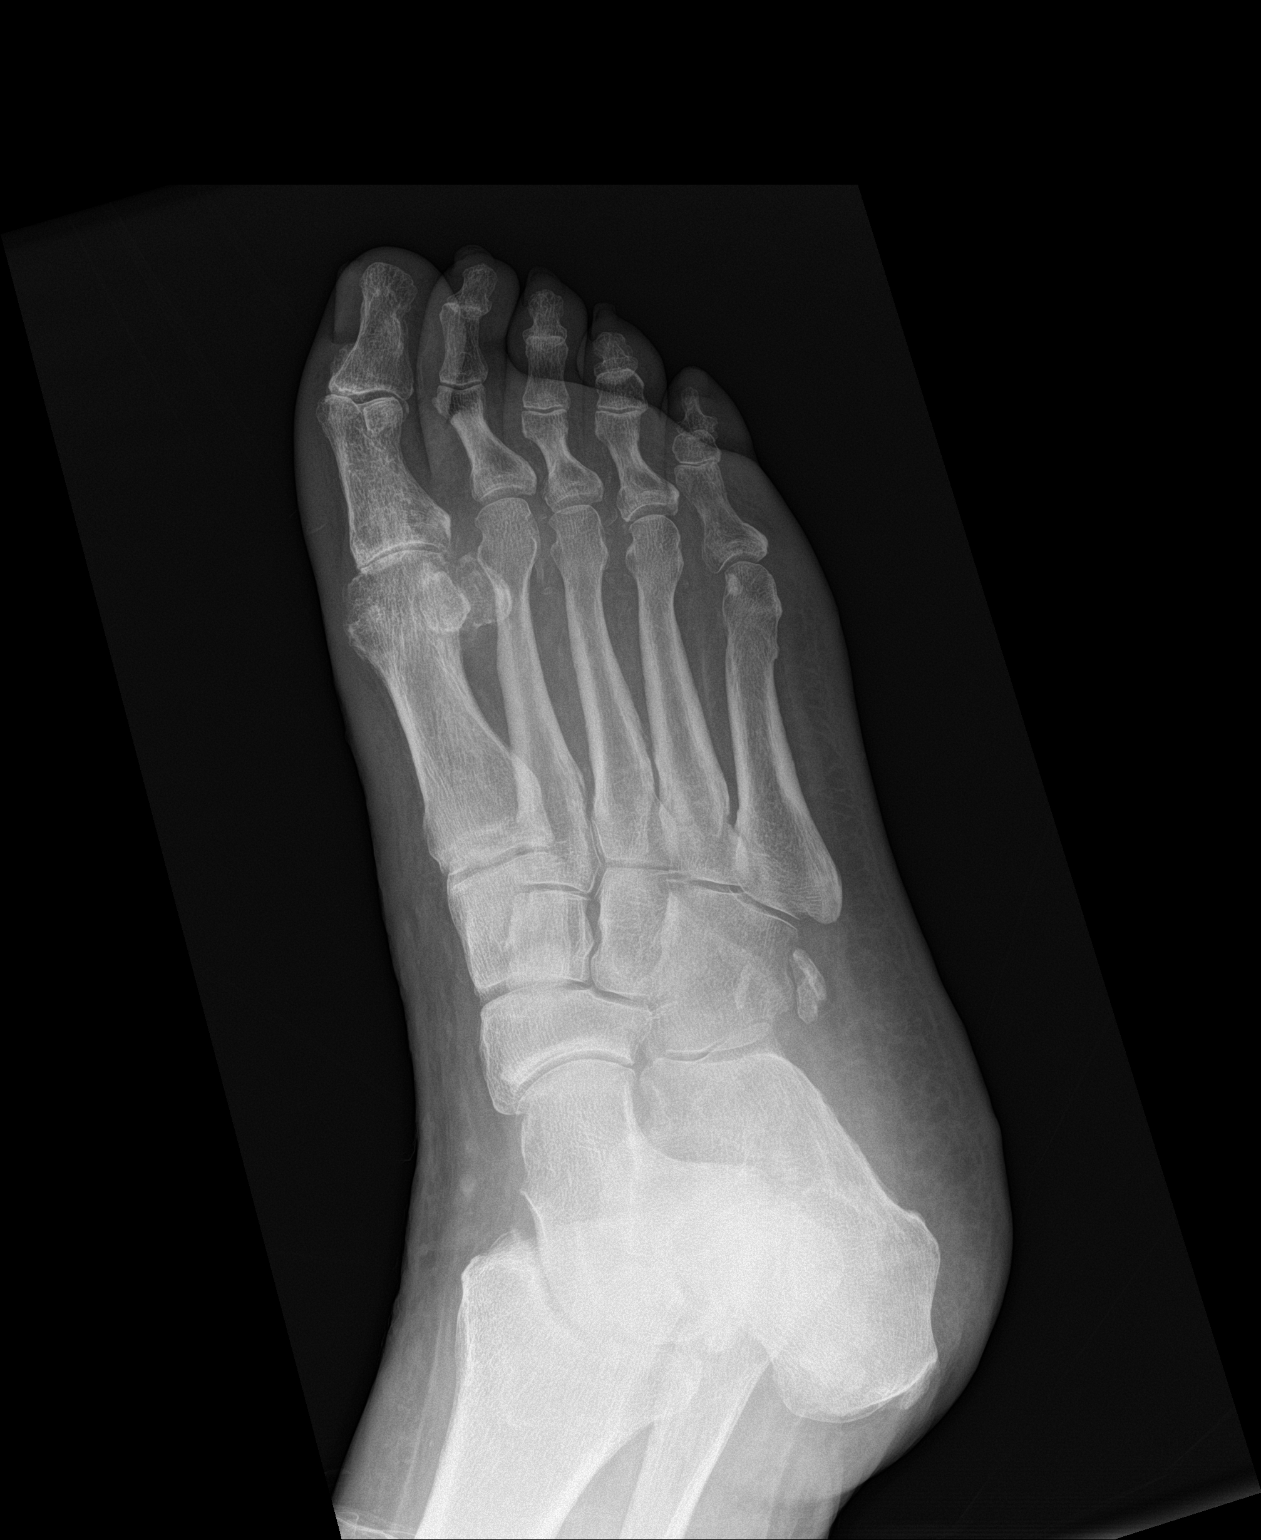

[foot lat]
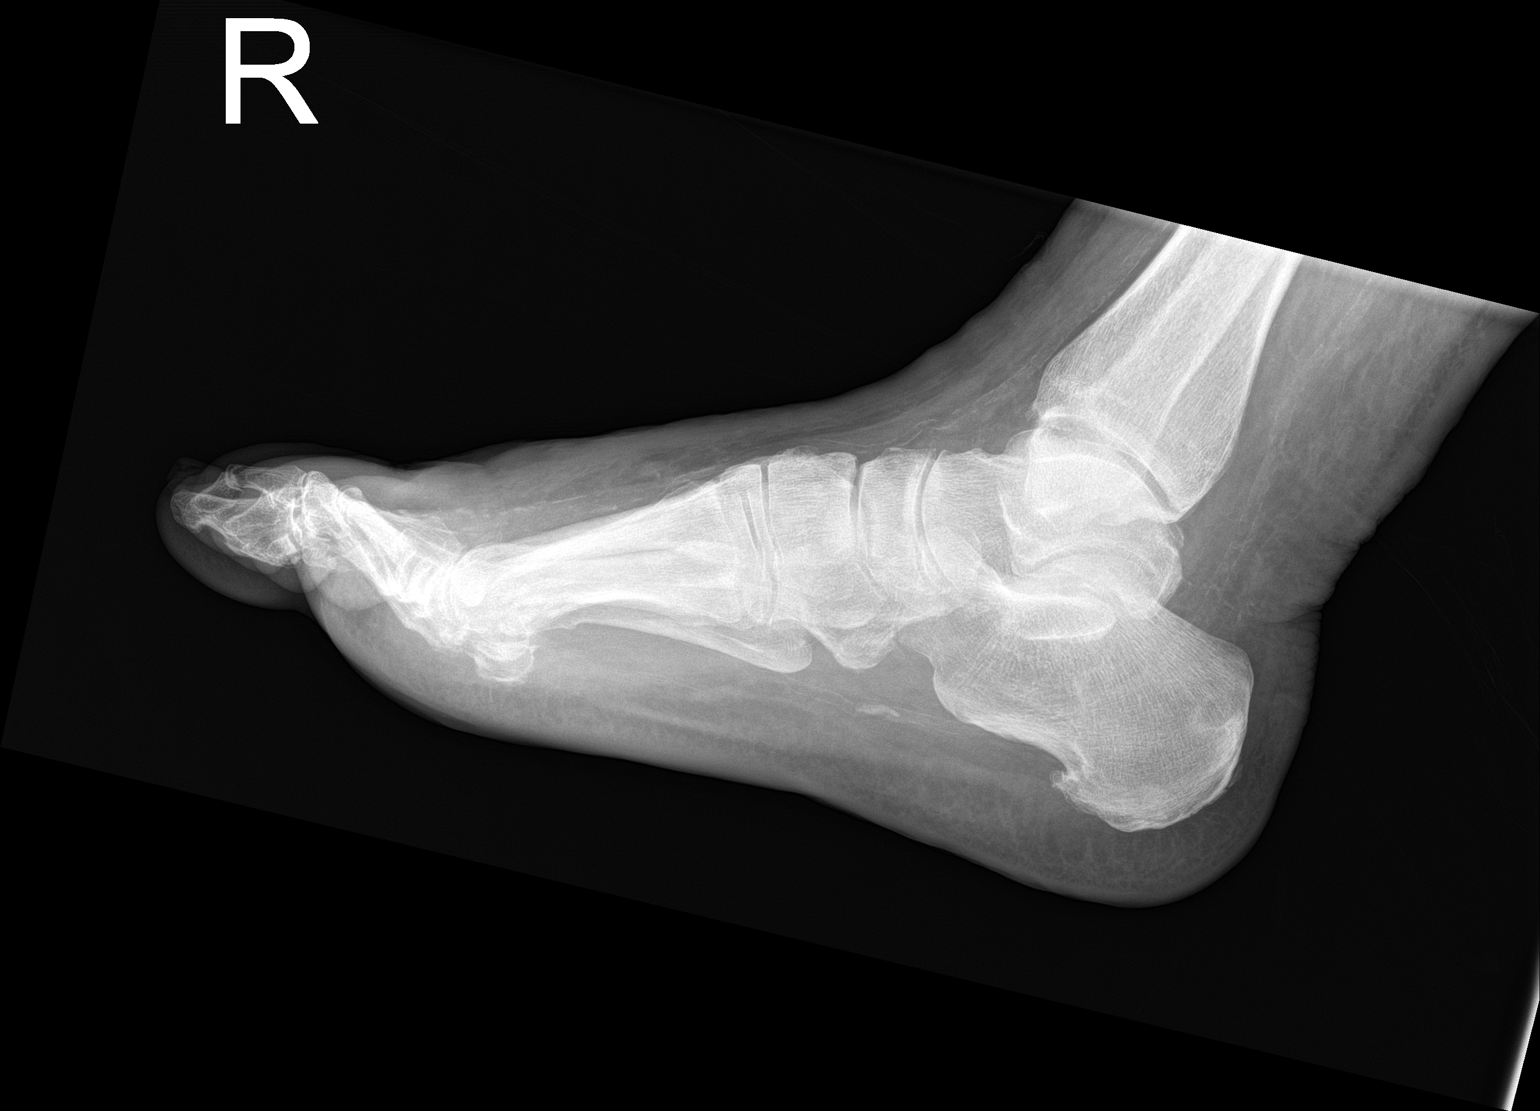

[3 of 3 positions shown; findings below may reference images not displayed]

FINDINGS: Mild osteopenia. No fracture or dislocation. No soft tissue
ulceration or emphysema. No osteolysis.
IMPRESSION: No radiographic evidence of osteomyelitis.
